# Patient Record
Sex: Male | Born: 1980 | Race: White | Hispanic: No | Marital: Married | Smoking: Never smoker
Health system: Southern US, Community
[De-identification: ages and names within clinical notes are randomized; demographics above are authoritative.]

## PROBLEM LIST (undated history)

## (undated) DIAGNOSIS — Z5189 Encounter for other specified aftercare: Secondary | ICD-10-CM

## (undated) DIAGNOSIS — J019 Acute sinusitis, unspecified: Secondary | ICD-10-CM

## (undated) DIAGNOSIS — Q2547 Right aortic arch: Secondary | ICD-10-CM

## (undated) DIAGNOSIS — D509 Iron deficiency anemia, unspecified: Principal | ICD-10-CM

## (undated) DIAGNOSIS — S83209A Unspecified tear of unspecified meniscus, current injury, unspecified knee, initial encounter: Secondary | ICD-10-CM

## (undated) DIAGNOSIS — L409 Psoriasis, unspecified: Secondary | ICD-10-CM

## (undated) DIAGNOSIS — M1990 Unspecified osteoarthritis, unspecified site: Secondary | ICD-10-CM

## (undated) DIAGNOSIS — L4050 Arthropathic psoriasis, unspecified: Secondary | ICD-10-CM

## (undated) HISTORY — DX: Unspecified osteoarthritis, unspecified site: M19.90

## (undated) HISTORY — DX: Iron deficiency anemia, unspecified: D50.9

## (undated) HISTORY — PX: INNER EAR SURGERY: SHX679

## (undated) HISTORY — PX: COLONOSCOPY WITH ESOPHAGOGASTRODUODENOSCOPY (EGD): SHX5779

## (undated) HISTORY — DX: Unspecified tear of unspecified meniscus, current injury, unspecified knee, initial encounter: S83.209A

## (undated) HISTORY — DX: Acute sinusitis, unspecified: J01.90

---
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
## 2009-02-16 ENCOUNTER — Ambulatory Visit (HOSPITAL_COMMUNITY): Admission: RE | Admit: 2009-02-16 | Discharge: 2009-02-16 | Payer: Self-pay | Admitting: Rheumatology

## 2009-02-16 ENCOUNTER — Encounter: Payer: Self-pay | Admitting: Cardiology

## 2009-02-16 ENCOUNTER — Ambulatory Visit: Payer: Self-pay | Admitting: Cardiology

## 2009-02-16 DIAGNOSIS — R930 Abnormal findings on diagnostic imaging of skull and head, not elsewhere classified: Secondary | ICD-10-CM | POA: Insufficient documentation

## 2009-02-18 DIAGNOSIS — I10 Essential (primary) hypertension: Secondary | ICD-10-CM | POA: Insufficient documentation

## 2009-02-20 ENCOUNTER — Encounter: Payer: Self-pay | Admitting: Cardiology

## 2009-02-20 ENCOUNTER — Ambulatory Visit: Payer: Self-pay

## 2013-06-09 ENCOUNTER — Other Ambulatory Visit (HOSPITAL_COMMUNITY): Payer: Self-pay | Admitting: *Deleted

## 2013-06-10 ENCOUNTER — Inpatient Hospital Stay (HOSPITAL_COMMUNITY): Admission: RE | Admit: 2013-06-10 | Payer: Self-pay | Source: Ambulatory Visit

## 2013-06-17 ENCOUNTER — Encounter (HOSPITAL_COMMUNITY)
Admission: RE | Admit: 2013-06-17 | Discharge: 2013-06-17 | Disposition: A | Discharge status: Home / Self Care | Payer: 59 | Source: Ambulatory Visit | Attending: Rheumatology | Admitting: Rheumatology

## 2013-06-17 DIAGNOSIS — Z538 Procedure and treatment not carried out for other reasons: Secondary | ICD-10-CM | POA: Insufficient documentation

## 2013-06-17 DIAGNOSIS — M069 Rheumatoid arthritis, unspecified: Secondary | ICD-10-CM | POA: Insufficient documentation

## 2013-06-17 MED ORDER — SODIUM CHLORIDE 0.9 % IV SOLN
INTRAVENOUS | Status: DC
  Administered 2013-06-17: 250 mL via INTRAVENOUS

## 2013-06-17 MED ORDER — SODIUM CHLORIDE 0.9 % IV SOLN
3.0000 mg/kg | INTRAVENOUS | Status: DC
  Administered 2013-06-17: 300 mg via INTRAVENOUS
  Filled 2013-06-17: qty 30

## 2013-07-01 ENCOUNTER — Encounter (HOSPITAL_COMMUNITY)
Admission: RE | Admit: 2013-07-01 | Discharge: 2013-07-01 | Disposition: A | Discharge status: Home / Self Care | Payer: 59 | Source: Ambulatory Visit | Attending: Rheumatology | Admitting: Rheumatology

## 2013-07-01 MED ORDER — SODIUM CHLORIDE 0.9 % IV SOLN
400.0000 mg | INTRAVENOUS | Status: DC
  Administered 2013-07-01: 400 mg via INTRAVENOUS
  Filled 2013-07-01: qty 40

## 2013-07-01 MED ORDER — SODIUM CHLORIDE 0.9 % IV SOLN
INTRAVENOUS | Status: DC
  Administered 2013-07-01: 250 mL via INTRAVENOUS

## 2013-07-01 MED ORDER — SODIUM CHLORIDE 0.9 % IV SOLN: 3.0000 mg/kg | INTRAVENOUS | Status: DC

## 2013-07-29 ENCOUNTER — Encounter (HOSPITAL_COMMUNITY): Payer: 59

## 2013-08-01 ENCOUNTER — Encounter (HOSPITAL_COMMUNITY)
Admission: RE | Admit: 2013-08-01 | Discharge: 2013-08-01 | Disposition: A | Discharge status: Home / Self Care | Payer: 59 | Source: Ambulatory Visit | Attending: Rheumatology | Admitting: Rheumatology

## 2013-08-01 DIAGNOSIS — Z538 Procedure and treatment not carried out for other reasons: Secondary | ICD-10-CM | POA: Insufficient documentation

## 2013-08-01 DIAGNOSIS — M069 Rheumatoid arthritis, unspecified: Secondary | ICD-10-CM | POA: Insufficient documentation

## 2013-08-01 MED ORDER — SODIUM CHLORIDE 0.9 % IV SOLN
400.0000 mg | INTRAVENOUS | Status: AC
  Administered 2013-08-01: 400 mg via INTRAVENOUS
  Filled 2013-08-01: qty 40

## 2013-08-01 MED ORDER — SODIUM CHLORIDE 0.9 % IV SOLN
INTRAVENOUS | Status: DC
  Administered 2013-08-01: 10:00:00 via INTRAVENOUS

## 2013-08-02 ENCOUNTER — Encounter (HOSPITAL_COMMUNITY): Payer: 59

## 2013-09-26 ENCOUNTER — Encounter (HOSPITAL_COMMUNITY): Payer: 59

## 2013-09-29 ENCOUNTER — Other Ambulatory Visit (HOSPITAL_COMMUNITY): Payer: Self-pay | Admitting: *Deleted

## 2013-09-30 ENCOUNTER — Inpatient Hospital Stay (HOSPITAL_COMMUNITY): Admission: RE | Admit: 2013-09-30 | Payer: 59 | Source: Ambulatory Visit

## 2013-11-01 ENCOUNTER — Ambulatory Visit (INDEPENDENT_AMBULATORY_CARE_PROVIDER_SITE_OTHER): Payer: 59 | Admitting: Nurse Practitioner

## 2013-11-01 ENCOUNTER — Encounter (INDEPENDENT_AMBULATORY_CARE_PROVIDER_SITE_OTHER): Payer: Self-pay

## 2013-11-01 VITALS — BP 145/93 | HR 93 | Temp 97.7°F | Wt 267.0 lb

## 2013-11-01 DIAGNOSIS — J069 Acute upper respiratory infection, unspecified: Secondary | ICD-10-CM

## 2013-11-01 DIAGNOSIS — A088 Other specified intestinal infections: Secondary | ICD-10-CM

## 2013-11-01 DIAGNOSIS — L4050 Arthropathic psoriasis, unspecified: Secondary | ICD-10-CM

## 2013-11-01 DIAGNOSIS — A084 Viral intestinal infection, unspecified: Secondary | ICD-10-CM

## 2013-11-01 MED ORDER — PROMETHAZINE HCL 12.5 MG PO TABS: 12.5000 mg | ORAL_TABLET | Freq: Three times a day (TID) | ORAL | Status: DC | PRN

## 2013-11-01 NOTE — Progress Notes (Signed)
   Subjective:    Patient ID: Joseph [name], male    DOB: 1981-10-09, 32 y.o.   MRN: [id number]  HPI  Patient in c/o nausea and vomiting last night with abdominal pain- that has resolved but woke up with headache this morning. * girlfriend said all of this really started when he did not go get IV remicade for psoriatic athritis.   Review of Systems  Constitutional: Positive for fever and chills.  HENT: Positive for congestion, rhinorrhea, sinus pressure and sore throat. Negative for trouble swallowing.   Respiratory: Positive for cough.   Gastrointestinal: Positive for nausea, vomiting and diarrhea.       Objective:   Physical Exam  Constitutional: [name] oriented to person, place, and time. He appears well-developed and well-nourished.  HENT:  Right Ear: Hearing, tympanic membrane, external ear and ear canal normal.  Left Ear: Hearing, tympanic membrane, external ear and ear canal normal.  Nose: Mucosal edema and rhinorrhea present. Right sinus exhibits no maxillary sinus tenderness and no frontal sinus tenderness. Left sinus exhibits no maxillary sinus tenderness and no frontal sinus tenderness.  Mouth/Throat: Uvula is midline, oropharynx is clear and moist and mucous membranes are normal.  Cardiovascular: Normal rate, regular rhythm and normal heart sounds.   Pulmonary/Chest: Effort normal and breath sounds normal.  Abdominal: Soft. Bowel sounds are normal.  Neurological: [name] alert and oriented to person, place, and time.  Skin: Skin is warm.  Psychiatric: He has a normal mood and affect. His behavior is normal. Judgment and thought content normal.    BP 145/93  Pulse 93  Temp(Src) 97.7 F (36.5 C) (Oral)  Wt 267 lb (121.11 kg)       Assessment & Plan:   1. Viral gastroenteritis   2. Viral URI   3. Psoriatic arthritis   4. Medication withdrawal    Meds ordered this encounter  Medications  . promethazine (PHENERGAN) 12.5 MG tablet    Sig: Take 1 tablet (12.5 mg  total) by mouth every 8 (eight) hours as needed for nausea or vomiting.    Dispense:  20 tablet    Refill:  0    Order Specific Question:  Supervising Provider    Answer:  Ernestina Penna [[id number]]   First 24 Hours-Clear liquids  popsicles  Jello  gatorade  Sprite Second 24 hours-Add Full liquids ( Liquids you cant see through) Third 24 hours- Bland diet ( foods that are baked or broiled)  *avoiding fried foods and highly spiced foods* During these 3 days  Avoid milk, cheese, ice cream or any other dairy products  Avoid caffeine- REMEMBER Mt. Dew and Mello Yellow contain lots of caffeine You should eat and drink in  Frequent small volumes If no improvement in symptoms or worsen in 2-3 days should RETRUN TO OFFICE or go to ER!    Need to make appointment for remicade infusion  Mary-Margaret Daphine Deutscher, FNP

## 2013-11-01 NOTE — Patient Instructions (Signed)

## 2013-11-27 ENCOUNTER — Emergency Department (HOSPITAL_COMMUNITY): Payer: 59

## 2013-11-27 ENCOUNTER — Encounter (HOSPITAL_COMMUNITY): Payer: Self-pay | Admitting: Emergency Medicine

## 2013-11-27 ENCOUNTER — Emergency Department (HOSPITAL_COMMUNITY)
Admission: EM | Admit: 2013-11-27 | Discharge: 2013-11-27 | Disposition: A | Discharge status: Home / Self Care | Payer: 59 | Attending: Emergency Medicine | Admitting: Emergency Medicine

## 2013-11-27 DIAGNOSIS — L03119 Cellulitis of unspecified part of limb: Principal | ICD-10-CM

## 2013-11-27 DIAGNOSIS — L0390 Cellulitis, unspecified: Secondary | ICD-10-CM

## 2013-11-27 DIAGNOSIS — Z8719 Personal history of other diseases of the digestive system: Secondary | ICD-10-CM | POA: Insufficient documentation

## 2013-11-27 DIAGNOSIS — L02519 Cutaneous abscess of unspecified hand: Secondary | ICD-10-CM | POA: Insufficient documentation

## 2013-11-27 DIAGNOSIS — Z8739 Personal history of other diseases of the musculoskeletal system and connective tissue: Secondary | ICD-10-CM | POA: Insufficient documentation

## 2013-11-27 DIAGNOSIS — R112 Nausea with vomiting, unspecified: Secondary | ICD-10-CM | POA: Insufficient documentation

## 2013-11-27 DIAGNOSIS — Z862 Personal history of diseases of the blood and blood-forming organs and certain disorders involving the immune mechanism: Secondary | ICD-10-CM | POA: Insufficient documentation

## 2013-11-27 HISTORY — DX: Encounter for other specified aftercare: Z51.89

## 2013-11-27 LAB — CBC WITH DIFFERENTIAL/PLATELET
BASOS ABS: 0.1 10*3/uL (ref 0.0–0.1)
Basophils Relative: 1 % (ref 0–1)
Eosinophils Absolute: 0 10*3/uL (ref 0.0–0.7)
Eosinophils Relative: 0 % (ref 0–5)
HCT: 38 % — ABNORMAL LOW (ref 39.0–52.0)
Hemoglobin: 11.6 g/dL — ABNORMAL LOW (ref 13.0–17.0)
LYMPHS ABS: 1.1 10*3/uL (ref 0.7–4.0)
LYMPHS PCT: 15 % (ref 12–46)
MCH: 22.8 pg — ABNORMAL LOW (ref 26.0–34.0)
MCHC: 30.5 g/dL (ref 30.0–36.0)
MCV: 74.7 fL — AB (ref 78.0–100.0)
MONOS PCT: 23 % — AB (ref 3–12)
Monocytes Absolute: 1.7 10*3/uL — ABNORMAL HIGH (ref 0.1–1.0)
NEUTROS PCT: 61 % (ref 43–77)
Neutro Abs: 4.7 10*3/uL (ref 1.7–7.7)
PLATELETS: 164 10*3/uL (ref 150–400)
RBC: 5.09 MIL/uL (ref 4.22–5.81)
RDW: 24.1 % — AB (ref 11.5–15.5)
WBC: 7.6 10*3/uL (ref 4.0–10.5)

## 2013-11-27 LAB — COMPREHENSIVE METABOLIC PANEL
ALBUMIN: 3.5 g/dL (ref 3.5–5.2)
ALT: 7 U/L (ref 0–53)
AST: 15 U/L (ref 0–37)
Alkaline Phosphatase: 76 U/L (ref 39–117)
BILIRUBIN TOTAL: 0.8 mg/dL (ref 0.3–1.2)
BUN: 7 mg/dL (ref 6–23)
CHLORIDE: 96 meq/L (ref 96–112)
CO2: 22 mEq/L (ref 19–32)
CREATININE: 1.05 mg/dL (ref 0.50–1.35)
Calcium: 8.6 mg/dL (ref 8.4–10.5)
GFR calc Af Amer: >90 mL/min (ref >90)
GFR calc non Af Amer: >90 mL/min (ref >90)
Glucose, Bld: 102 mg/dL — ABNORMAL HIGH (ref 70–99)
Potassium: 3.4 mEq/L — ABNORMAL LOW (ref 3.7–5.3)
Sodium: 137 mEq/L (ref 137–147)
Total Protein: 7.5 g/dL (ref 6.0–8.3)

## 2013-11-27 LAB — OCCULT BLOOD, POC DEVICE: FECAL OCCULT BLD: NEGATIVE

## 2013-11-27 LAB — TYPE AND SCREEN
ABO/RH(D): A POS
ANTIBODY SCREEN: NEGATIVE

## 2013-11-27 LAB — ABO/RH: ABO/RH(D): A POS

## 2013-11-27 MED ORDER — CLINDAMYCIN HCL 150 MG PO CAPS: 450.0000 mg | ORAL_CAPSULE | Freq: Three times a day (TID) | ORAL | Status: DC

## 2013-11-27 MED ORDER — ONDANSETRON 4 MG PO TBDP: 4.0000 mg | ORAL_TABLET | Freq: Three times a day (TID) | ORAL | Status: DC | PRN

## 2013-11-27 MED ORDER — CLINDAMYCIN PHOSPHATE 900 MG/50ML IV SOLN
900.0000 mg | Freq: Once | INTRAVENOUS | Status: AC
  Administered 2013-11-27: 900 mg via INTRAVENOUS
  Filled 2013-11-27: qty 50

## 2013-11-27 NOTE — ED Provider Notes (Signed)
CSN: [id number]     Arrival date & time 11/27/13  1640 History   First MD Initiated Contact with Patient 11/27/13 1755     Chief Complaint  Patient presents with  . Nausea  . Emesis   (Consider location/radiation/quality/duration/timing/severity/associated sxs/prior Treatment) Patient is a 33 y.o. male presenting with general illness. The history is provided by the patient.  Illness Severity:  Moderate Onset quality:  Gradual Duration:  2 days Timing:  Constant Progression:  Worsening Chronicity:  New Associated symptoms: fever, nausea and vomiting   Associated symptoms: no abdominal pain, no chest pain, no congestion, no cough, no diarrhea, no headaches, no myalgias, no rash, no rhinorrhea and no shortness of breath     Past Medical History  Diagnosis Date  . Arthritis   . Blood transfusion without reported diagnosis     At [facility]    Past Surgical History  Procedure Laterality Date  . Hernia repair     Family History  Problem Relation Age of Onset  . Pulmonary fibrosis Mother    History  Substance Use Topics  . Smoking status: Never Smoker   . Smokeless tobacco: Not on file  . Alcohol Use: No    Review of Systems  Constitutional: Positive for fever. Negative for chills.  HENT: Negative for congestion, facial swelling and rhinorrhea.   Eyes: Negative for discharge and visual disturbance.  Respiratory: Negative for cough and shortness of breath.   Cardiovascular: Negative for chest pain and palpitations.  Gastrointestinal: Positive for nausea and vomiting. Negative for abdominal pain and diarrhea.  Musculoskeletal: Negative for arthralgias and myalgias.  Skin: Negative for color change and rash.  Neurological: Negative for tremors, syncope and headaches.  Psychiatric/Behavioral: Negative for confusion and dysphoric mood.    Allergies  Sulfonamide derivatives  Home Medications   Current Outpatient Rx  Name  Route  Sig  Dispense  Refill  . promethazine  (PHENERGAN) 12.5 MG tablet   Oral   Take 1 tablet (12.5 mg total) by mouth every 8 (eight) hours as needed for nausea or vomiting.   20 tablet   0    BP 129/87  Pulse 111  Temp(Src) 98.9 F (37.2 C) (Oral)  Resp 18  SpO2 95% Physical Exam  Constitutional: [name] oriented to person, place, and time. He appears well-developed and well-nourished.  HENT:  Head: Normocephalic and atraumatic.  Eyes: EOM are normal. Pupils are equal, round, and reactive to light.  Neck: Normal range of motion. Neck supple. No JVD present.  Cardiovascular: Normal rate and regular rhythm.  Exam reveals no gallop and no friction rub.   No murmur heard. Pulmonary/Chest: No respiratory distress. He has no wheezes.  Abdominal: He exhibits no distension. There is no rebound and no guarding.  Musculoskeletal: Normal range of motion.  Neurological: [name] alert and oriented to person, place, and time.  Skin: No rash noted. No pallor.     Psychiatric: He has a normal mood and affect. His behavior is normal.    ED Course  Procedures (including critical care time) Labs Review Labs Reviewed  CBC WITH DIFFERENTIAL - Abnormal; Notable for the following:    Hemoglobin 11.6 (*)    HCT 38.0 (*)    MCV 74.7 (*)    MCH 22.8 (*)    RDW 24.1 (*)    All other components within normal limits  COMPREHENSIVE METABOLIC PANEL  TYPE AND SCREEN   Imaging Review No results found.  EKG Interpretation   None  MDM  No diagnosis found. Patient is a 33 y.o. male who presents with fever, vomiting.  This started yesterday.  Patient recently admitted to [facility] for anemia, thought to be secondary to lower GI bleed.  Negative CT scan, negative endoscopy.  Patient received 6 units of blood to correct hbg of 5.5, patient responded appropriately and is 11 here.  Family concerned because these were the symptoms he had prior to finding out he was anemic.  Denies abdominal pain, no bright red blood in vomit or stool, no dark  and tarry stools.    Right wrist erythematous and edematous.  Full ROM right wrist with minimal tenderness, doubt septic joint, pain at location at prior iv placement, suspect cellulitis, will obtain blood cultures, treat with clinda, xray to rule out foreign body.   No noted foreign body on xray, patient well appearing, tolerated PO in the ED.  Will discharge home.    I have discussed the diagnosis/risks/treatment options with the patient and family and believe the pt to be eligible for discharge home to follow-up with PCP. We also discussed returning to the ED immediately if new or worsening sx occur. We discussed the sx which are most concerning (e.g., sudden onset abdominal pain, worsening wrist pain) that necessitate immediate return. Any new prescriptions provided to the patient are listed below.  Discharge Medication List as of 11/27/2013  9:57 PM    START taking these medications   Details  clindamycin (CLEOCIN) 150 MG capsule Take 3 capsules (450 mg total) by mouth 3 (three) times daily., Starting 11/27/2013, Until Discontinued, Print            Melene Planan Shamiah Kahler, MD 11/28/13 [phone]

## 2013-11-27 NOTE — Discharge Instructions (Signed)
Cellulitis Cellulitis is an infection of the skin and the tissue beneath it. The infected area is usually red and tender. Cellulitis occurs most often in the arms and lower legs.  CAUSES  Cellulitis is caused by bacteria that enter the skin through cracks or cuts in the skin. The most common types of bacteria that cause cellulitis are Staphylococcus and Streptococcus. SYMPTOMS   Redness and warmth.  Swelling.  Tenderness or pain.  Fever. DIAGNOSIS  Your caregiver can usually determine what is wrong based on a physical exam. Blood tests may also be done. TREATMENT  Treatment usually involves taking an antibiotic medicine. HOME CARE INSTRUCTIONS   Take your antibiotics as directed. Finish them even if you start to feel better.  Keep the infected arm or leg elevated to reduce swelling.  Apply a warm cloth to the affected area up to 4 times per day to relieve pain.  Only take over-the-counter or prescription medicines for pain, discomfort, or fever as directed by your caregiver.  Keep all follow-up appointments as directed by your caregiver. SEEK MEDICAL CARE IF:   You notice red streaks coming from the infected area.  Your red area gets larger or turns dark in color.  Your bone or joint underneath the infected area becomes painful after the skin has healed.  Your infection returns in the same area or another area.  You notice a swollen bump in the infected area.  You develop new symptoms. SEEK IMMEDIATE MEDICAL CARE IF:   You have a fever.  You feel very sleepy.  You develop vomiting or diarrhea.  You have a general ill feeling (malaise) with muscle aches and pains. MAKE SURE YOU:   Understand these instructions.  Will watch your condition.  Will get help right away if you are not doing well or get worse. Document Released: 08/06/2005 Document Revised: 04/27/2012 Document Reviewed: 01/12/2012 Exit[employer] Patient Information 2014 [employer].  

## 2013-11-27 NOTE — ED Notes (Signed)
Pt given ginger ale.

## 2013-11-27 NOTE — ED Notes (Signed)
[facility]. Reports that his Hemoglobin was 5 and he got 6 units of blood. Reports that he was sent home Friday and has been having fever, nausea and vomiting since then. Pt right wrist is swollen where IV site was from hospital admission.

## 2013-11-28 NOTE — ED Provider Notes (Signed)
I saw and evaluated the patient, reviewed the resident's note and I agree with the findings and plan. If applicable, I agree with the resident's interpretation of the EKG.  If applicable, I was present for critical portions of any procedures performed.  R hand/wrist erythema and swelling at site of previous IV insertion. +2 radial pulse, FROM wrist, hand fingers. No evidence of septic joint. [facility] records reviewed.  Admitted with severe anemia and EGD that showed erosions. Hemoglobin appropriate today. Abdomen soft and nontender.  BP 133/86  Pulse 87  Temp(Src) 99.4 F (37.4 C) (Oral)  Resp 14  SpO2 97%   Glynn OctaveStephen Teneshia Hedeen, MD 11/28/13 1016

## 2013-12-04 LAB — CULTURE, BLOOD (ROUTINE X 2)
CULTURE: NO GROWTH
Culture: NO GROWTH

## 2014-03-02 ENCOUNTER — Other Ambulatory Visit (HOSPITAL_COMMUNITY): Payer: Self-pay | Admitting: *Deleted

## 2014-03-03 ENCOUNTER — Encounter (HOSPITAL_COMMUNITY)
Admission: RE | Admit: 2014-03-03 | Discharge: 2014-03-03 | Disposition: A | Discharge status: Home / Self Care | Payer: 59 | Source: Ambulatory Visit | Attending: Rheumatology | Admitting: Rheumatology

## 2014-03-03 DIAGNOSIS — L4050 Arthropathic psoriasis, unspecified: Secondary | ICD-10-CM | POA: Diagnosis not present

## 2014-03-03 MED ORDER — SODIUM CHLORIDE 0.9 % IV SOLN
INTRAVENOUS | Status: DC
  Administered 2014-03-03: 09:00:00 via INTRAVENOUS

## 2014-03-03 MED ORDER — SODIUM CHLORIDE 0.9 % IV SOLN
3.0000 mg/kg | Freq: Once | INTRAVENOUS | Status: AC
  Administered 2014-03-03: 400 mg via INTRAVENOUS
  Filled 2014-03-03: qty 40

## 2014-04-28 ENCOUNTER — Encounter (HOSPITAL_COMMUNITY)
Admission: RE | Admit: 2014-04-28 | Discharge: 2014-04-28 | Disposition: A | Discharge status: Home / Self Care | Payer: 59 | Source: Ambulatory Visit | Attending: Rheumatology | Admitting: Rheumatology

## 2014-04-28 DIAGNOSIS — L4050 Arthropathic psoriasis, unspecified: Secondary | ICD-10-CM | POA: Insufficient documentation

## 2014-04-28 MED ORDER — SODIUM CHLORIDE 0.9 % IV SOLN
Freq: Once | INTRAVENOUS | Status: AC
  Administered 2014-04-28: 250 mL via INTRAVENOUS

## 2014-04-28 MED ORDER — SODIUM CHLORIDE 0.9 % IV SOLN
400.0000 mg | Freq: Once | INTRAVENOUS | Status: AC
  Administered 2014-04-28: 400 mg via INTRAVENOUS
  Filled 2014-04-28: qty 40

## 2014-06-23 ENCOUNTER — Encounter (HOSPITAL_COMMUNITY)
Admission: RE | Admit: 2014-06-23 | Discharge: 2014-06-23 | Disposition: A | Discharge status: Home / Self Care | Payer: 59 | Source: Ambulatory Visit | Attending: Rheumatology | Admitting: Rheumatology

## 2014-06-23 DIAGNOSIS — L4050 Arthropathic psoriasis, unspecified: Secondary | ICD-10-CM | POA: Insufficient documentation

## 2014-06-23 MED ORDER — SODIUM CHLORIDE 0.9 % IV SOLN
3.0000 mg/kg | INTRAVENOUS | Status: DC
  Administered 2014-06-23: 400 mg via INTRAVENOUS
  Filled 2014-06-23: qty 40

## 2014-06-23 MED ORDER — SODIUM CHLORIDE 0.9 % IV SOLN
INTRAVENOUS | Status: DC
  Administered 2014-06-23: 09:00:00 via INTRAVENOUS

## 2014-08-17 ENCOUNTER — Other Ambulatory Visit (HOSPITAL_COMMUNITY): Payer: Self-pay | Admitting: *Deleted

## 2014-08-18 ENCOUNTER — Encounter (HOSPITAL_COMMUNITY): Payer: 59

## 2014-08-23 ENCOUNTER — Encounter (HOSPITAL_COMMUNITY)
Admission: RE | Admit: 2014-08-23 | Discharge: 2014-08-23 | Disposition: A | Discharge status: Home / Self Care | Payer: 59 | Source: Ambulatory Visit | Attending: Rheumatology | Admitting: Rheumatology

## 2014-08-23 DIAGNOSIS — L4050 Arthropathic psoriasis, unspecified: Secondary | ICD-10-CM | POA: Insufficient documentation

## 2014-08-23 MED ORDER — SODIUM CHLORIDE 0.9 % IV SOLN
3.0000 mg/kg | INTRAVENOUS | Status: DC
  Administered 2014-08-23: 400 mg via INTRAVENOUS
  Filled 2014-08-23: qty 40

## 2014-08-23 MED ORDER — SODIUM CHLORIDE 0.9 % IV SOLN: INTRAVENOUS | Status: DC

## 2014-09-13 ENCOUNTER — Telehealth: Payer: Self-pay | Admitting: Hematology and Oncology

## 2014-09-13 NOTE — Telephone Encounter (Signed)
S/W PATIENT AND GAVE NP APPT FOR 11/12 @ 1:15 W/GORSUCH.

## 2014-09-21 ENCOUNTER — Ambulatory Visit (HOSPITAL_BASED_OUTPATIENT_CLINIC_OR_DEPARTMENT_OTHER): Payer: 59 | Admitting: Hematology and Oncology

## 2014-09-21 ENCOUNTER — Encounter: Payer: Self-pay | Admitting: Hematology and Oncology

## 2014-09-21 ENCOUNTER — Telehealth: Payer: Self-pay | Admitting: Hematology and Oncology

## 2014-09-21 ENCOUNTER — Ambulatory Visit (HOSPITAL_BASED_OUTPATIENT_CLINIC_OR_DEPARTMENT_OTHER): Payer: 59

## 2014-09-21 VITALS — BP 132/71 | HR 98 | Temp 98.9°F | Resp 18 | Ht 71.0 in | Wt 278.8 lb

## 2014-09-21 DIAGNOSIS — L4050 Arthropathic psoriasis, unspecified: Secondary | ICD-10-CM | POA: Insufficient documentation

## 2014-09-21 DIAGNOSIS — D509 Iron deficiency anemia, unspecified: Secondary | ICD-10-CM

## 2014-09-21 HISTORY — DX: Iron deficiency anemia, unspecified: D50.9

## 2014-09-21 NOTE — Telephone Encounter (Signed)
gv adn printed appt sched and avs for pt for NOV adn DEC...sed added tx.... °

## 2014-09-21 NOTE — Assessment & Plan Note (Signed)
He was treated with steroids, methotrexate and most recently was placed on Remicade All these agents can cause profound anemia with bone marrow suppressive effects. I will defer to his rheumatologist for treatment. In the mean time, I will attempt to treat his anemia. [name] recommended to take folic acid supplement

## 2014-09-21 NOTE — Progress Notes (Signed)
[facility] CONSULT NOTE  Patient Care Team: Joette CatchingLeonard Nyland, MD as PCP - General (Family Medicine)  CHIEF COMPLAINTS/PURPOSE OF CONSULTATION:  Severe anemia  HISTORY OF PRESENTING ILLNESS:  Joseph [name] 33 y.o. male is here because of anemia.  He was found to have abnormal CBC from routine blood work monitoring. His most recent blood work ranged from 7.8 to 8.5. His labs from January 2015 was 11.6 He denies recent chest pain on exertion, pre-syncopal episodes, or palpitations. He complained of fatigue, dizzy and had leg cramps He had not noticed any recent bleeding such as epistaxis, hematuria or hematochezia The patient denies over the counter NSAID ingestion recently. [name] not on antiplatelets agents. His last colonoscopy was in January 2015.  He had no prior history or diagnosis of cancer. His age appropriate screening programs are up-to-date. He has excessive pica with chewing on ice. He eats a variety of diet. He never donated blood but had received blood transfusion The patient was not prescribed oral iron supplements yet. He has psoriatic arthritis and had been on various treatments including steroids, remicade and methotrexate.  MEDICAL HISTORY:  Past Medical History  Diagnosis Date  . Arthritis   . Blood transfusion without reported diagnosis     [facility]   . Iron deficiency anemia 09/21/2014    SURGICAL HISTORY: Past Surgical History  Procedure Laterality Date  . Hernia repair    . Colonoscopy with esophagogastroduodenoscopy (egd)      SOCIAL HISTORY: History   Social History  . Marital Status: Single    Spouse Name: N/A    Number of Children: N/A  . Years of Education: N/A   Occupational History  . Not on file.   Social History Main Topics  . Smoking status: Never Smoker   . Smokeless tobacco: Never Used  . Alcohol Use: No  . Drug Use: No  . Sexual Activity: Not on file   Other Topics Concern  . Not on file   Social History  Narrative    FAMILY HISTORY: Family History  Problem Relation Age of Onset  . Pulmonary fibrosis Mother     ALLERGIES:  is allergic to sulfonamide derivatives.  MEDICATIONS:  Current Outpatient Prescriptions  Medication Sig Dispense Refill  . clobetasol cream (TEMOVATE) 0.05 % Apply topically 2 (two) times daily as needed.    . folic acid (FOLVITE) 1 MG tablet Take 2 mg by mouth daily.    . InFLIXimab (REMICADE IV) Inject into the vein. Every 6 weeks per Dr. Corliss Skainseveshwar    . OTREXUP 10 MG/0.4ML SOAJ Inject 10 mg into the skin once a week.     No current facility-administered medications for this visit.    REVIEW OF SYSTEMS:   Constitutional: Denies fevers, chills or abnormal night sweats Eyes: Denies blurriness of vision, double vision or watery eyes Ears, nose, mouth, throat, and face: Denies mucositis or sore throat Respiratory: Denies cough, dyspnea or wheezes Cardiovascular: Denies palpitation, chest discomfort or lower extremity swelling Gastrointestinal:  Denies nausea, heartburn or change in bowel habits Skin: Denies abnormal skin rashes Lymphatics: Denies new lymphadenopathy or easy bruising Neurological:Denies numbness, tingling or new weaknesses Behavioral/Psych: Mood is stable, no new changes  All other systems were reviewed with the patient and are negative.  PHYSICAL EXAMINATION: ECOG PERFORMANCE STATUS: 1 - Symptomatic but completely ambulatory  Filed Vitals:   09/21/14 1319  BP: 132/71  Pulse: 98  Temp: 98.9 F (37.2 C)  Resp: 18   Filed  Weights   09/21/14 1319  Weight: 278 lb 12.8 oz (126.463 kg)    GENERAL:alert, no distress and comfortable. [name] morbidly obese. SKIN: skin color is pale, texture, turgor are normal, no rashes or significant lesions EYES: normal, conjunctiva are pale and non-injected, sclera clear OROPHARYNX:no exudate, no erythema and lips, buccal mucosa, and tongue normal  NECK: supple, thyroid normal size, non-tender, without  nodularity LYMPH:  no palpable lymphadenopathy in the cervical, axillary or inguinal LUNGS: clear to auscultation and percussion with normal breathing effort HEART: regular rate & rhythm and no murmurs and no lower extremity edema ABDOMEN:abdomen soft, non-tender and normal bowel sounds Musculoskeletal:no cyanosis of digits and no clubbing  PSYCH: alert & oriented x 3 with fluent speech NEURO: no focal motor/sensory deficits  LABORATORY DATA:  I have reviewed the data as listed  ASSESSMENT & PLAN:  Iron deficiency anemia The most likely cause of his anemia is due to chronic blood loss/malabsorption syndrome all bone marrow suppression from chronic immunosuppressive therapy. We discussed some of the risks, benefits, and alternatives of intravenous iron infusions. The patient is symptomatic from anemia and the iron level is critically low. He tolerated oral iron supplement poorly and desires to achieved higher levels of iron faster for adequate hematopoesis. Some of the side-effects to be expected including risks of infusion reactions, phlebitis, headaches, nausea and fatigue.  The patient is willing to proceed. Patient education material was dispensed.  Goal is to keep ferritin level greater than 50. I plan to give him 1 dose of IV iron next week and recommend he continues taking folic acid. I plan to check additional labs for other cause of anemia and celiac disease panel. I have requested consent to obtain information from his GI doctor who had performed EGD and colonoscopy recently for my review. I reminded him to avoid taking NSAIDS for joint pain which can increase his risk for GI bleed.  Psoriatic arthritis He was treated with steroids, methotrexate and most recently was placed on Remicade All these agents can cause profound anemia with bone marrow suppressive effects. I will defer to his rheumatologist for treatment. In the mean time, I will attempt to treat his anemia. [name]  recommended to take folic acid supplement    All questions were answered. The patient knows to call the clinic with any problems, questions or concerns. I spent 40 minutes counseling the patient face to face. The total time spent in the appointment was 60 minutes and more than 50% was on counseling.     [facility] Ctr[facility]ORSUCH, Dominico Rod, MD 09/21/2014 8:48 PM

## 2014-09-21 NOTE — Progress Notes (Signed)
Checked in new patient with no financial issues prior to seeing the dr. He has not traveled and has appt card.

## 2014-09-21 NOTE — Patient Instructions (Signed)

## 2014-09-21 NOTE — Assessment & Plan Note (Addendum)
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 We discussed some of the risks, benefits, and alternatives of intravenous iron infusions. The patient is symptomatic from anemia and the iron level is critically low. He tolerated oral iron supplement poorly and desires to achieved higher levels of iron faster for adequate hematopoesis. Some of the side-effects to be expected including risks of infusion reactions, phlebitis, headaches, nausea and fatigue.  The patient is willing to proceed. Patient education material was dispensed.  Goal is to keep ferritin level greater than 50. I plan to give him 1 dose of IV iron next week and recommend he continues taking folic acid. I plan to check additional labs for other cause of anemia and celiac disease panel. I have requested consent to obtain information from his GI doctor who had performed EGD and colonoscopy recently for my review. I reminded him to avoid taking NSAIDS for joint pain which can increase his risk for GI bleed.

## 2014-09-22 ENCOUNTER — Telehealth: Payer: Self-pay | Admitting: *Deleted

## 2014-09-22 NOTE — Telephone Encounter (Signed)
I have adjsuted 11/18 appt

## 2014-09-22 NOTE — Telephone Encounter (Signed)
Faxed signed release of medical information to Dr. Teena DunkBenson, GI, in [place] to fax [phone].  Dr. Bertis RuddyGorsuch requests EGD/Colonoscopy report.

## 2014-09-22 NOTE — Telephone Encounter (Signed)
Attempting to reach pt to notify of appt time change on Wed from afternoon to morning.  No voice mail available on his cell phone and his girlfriend's cell phone.  Called home number and it was his father who instructed to call pt's cell phone.  Will try again next week.

## 2014-09-27 ENCOUNTER — Ambulatory Visit (HOSPITAL_BASED_OUTPATIENT_CLINIC_OR_DEPARTMENT_OTHER): Payer: 59

## 2014-09-27 DIAGNOSIS — D509 Iron deficiency anemia, unspecified: Secondary | ICD-10-CM

## 2014-09-27 MED ORDER — SODIUM CHLORIDE 0.9 % IV SOLN
1020.0000 mg | Freq: Once | INTRAVENOUS | Status: AC
  Administered 2014-09-27: 1020 mg via INTRAVENOUS
  Filled 2014-09-27: qty 34

## 2014-09-27 MED ORDER — SODIUM CHLORIDE 0.9 % IV SOLN
Freq: Once | INTRAVENOUS | Status: AC
  Administered 2014-09-27: 15:00:00 via INTRAVENOUS

## 2014-09-27 NOTE — Progress Notes (Signed)
Patient received Feraheme for the 1st time today; tolerated well with no complaints; observation for 30 mins post infusion and no signs or symptoms of distress noted; post vital signs stable; patient discharge home with girlfriend and no complaints.

## 2014-09-27 NOTE — Patient Instructions (Signed)

## 2014-10-18 ENCOUNTER — Encounter (HOSPITAL_COMMUNITY): Payer: 59

## 2014-10-19 ENCOUNTER — Encounter: Payer: Self-pay | Admitting: Hematology and Oncology

## 2014-10-19 ENCOUNTER — Other Ambulatory Visit (HOSPITAL_COMMUNITY): Payer: Self-pay | Admitting: *Deleted

## 2014-10-20 ENCOUNTER — Encounter (HOSPITAL_COMMUNITY)
Admission: RE | Admit: 2014-10-20 | Discharge: 2014-10-20 | Disposition: A | Discharge status: Home / Self Care | Payer: 59 | Source: Ambulatory Visit | Attending: Rheumatology | Admitting: Rheumatology

## 2014-10-20 DIAGNOSIS — L4050 Arthropathic psoriasis, unspecified: Secondary | ICD-10-CM | POA: Diagnosis not present

## 2014-10-20 MED ORDER — SODIUM CHLORIDE 0.9 % IV SOLN
INTRAVENOUS | Status: AC
  Administered 2014-10-20: 09:00:00 via INTRAVENOUS

## 2014-10-20 MED ORDER — INFLIXIMAB 100 MG IV SOLR
3.0000 mg/kg | INTRAVENOUS | Status: AC
  Administered 2014-10-20: 400 mg via INTRAVENOUS
  Filled 2014-10-20: qty 40

## 2014-10-24 ENCOUNTER — Telehealth: Payer: Self-pay | Admitting: Hematology and Oncology

## 2014-10-24 NOTE — Telephone Encounter (Signed)
returned pt call and lvm for pt to call back to rs °

## 2014-10-25 ENCOUNTER — Other Ambulatory Visit (HOSPITAL_BASED_OUTPATIENT_CLINIC_OR_DEPARTMENT_OTHER): Payer: 59

## 2014-10-25 DIAGNOSIS — D509 Iron deficiency anemia, unspecified: Secondary | ICD-10-CM

## 2014-10-25 LAB — CBC & DIFF AND RETIC
BASO%: 0.8 % (ref 0.0–2.0)
BASOS ABS: 0.1 10*3/uL (ref 0.0–0.1)
EOS ABS: 0.2 10*3/uL (ref 0.0–0.5)
EOS%: 2.3 % (ref 0.0–7.0)
HEMATOCRIT: 32.8 % — AB (ref 38.4–49.9)
HGB: 9.6 g/dL — ABNORMAL LOW (ref 13.0–17.1)
IMMATURE RETIC FRACT: 16.1 % — AB (ref 3.00–10.60)
LYMPH#: 3.1 10*3/uL (ref 0.9–3.3)
LYMPH%: 34.5 % (ref 14.0–49.0)
MCH: 21.6 pg — AB (ref 27.2–33.4)
MCHC: 29.3 g/dL — ABNORMAL LOW (ref 32.0–36.0)
MCV: 73.8 fL — ABNORMAL LOW (ref 79.3–98.0)
MONO#: 0.7 10*3/uL (ref 0.1–0.9)
MONO%: 7.8 % (ref 0.0–14.0)
NEUT#: 4.9 10*3/uL (ref 1.5–6.5)
NEUT%: 54.6 % (ref 39.0–75.0)
Platelets: 210 10*3/uL (ref 140–400)
RBC: 4.45 10*6/uL (ref 4.20–5.82)
RDW: 28.2 % — AB (ref 11.0–14.6)
RETIC CT ABS: 68.09 10*3/uL (ref 34.80–93.90)
Retic %: 1.53 % (ref 0.80–1.80)
WBC: 9 10*3/uL (ref 4.0–10.3)

## 2014-10-25 LAB — IRON AND TIBC CHCC
%SAT: 7 % — AB (ref 20–55)
Iron: 22 ug/dL — ABNORMAL LOW (ref 42–163)
TIBC: 341 ug/dL (ref 202–409)
UIBC: 319 ug/dL (ref 117–376)

## 2014-10-25 LAB — COMPREHENSIVE METABOLIC PANEL (CC13)
ALBUMIN: 3.7 g/dL (ref 3.5–5.0)
ALT: 16 U/L (ref 0–55)
ANION GAP: 10 meq/L (ref 3–11)
AST: 18 U/L (ref 5–34)
Alkaline Phosphatase: 83 U/L (ref 40–150)
BUN: 13.4 mg/dL (ref 7.0–26.0)
CHLORIDE: 104 meq/L (ref 98–109)
CO2: 24 mEq/L (ref 22–29)
Calcium: 8.5 mg/dL (ref 8.4–10.4)
Creatinine: 1 mg/dL (ref 0.7–1.3)
EGFR: >90 mL/min/{1.73_m2} (ref >90)
Glucose: 103 mg/dl (ref 70–140)
POTASSIUM: 3.2 meq/L — AB (ref 3.5–5.1)
Sodium: 138 mEq/L (ref 136–145)
Total Bilirubin: 0.43 mg/dL (ref 0.20–1.20)
Total Protein: 7 g/dL (ref 6.4–8.3)

## 2014-10-25 LAB — CHCC SMEAR

## 2014-10-25 LAB — FERRITIN CHCC: Ferritin: 25 ng/ml (ref 22–316)

## 2014-10-26 LAB — SEDIMENTATION RATE: Sed Rate: 5 mm/hr (ref 0–16)

## 2014-10-26 LAB — VITAMIN B12: Vitamin B-12: 354 pg/mL (ref 211–911)

## 2014-10-26 LAB — DIRECT ANTIGLOBULIN TEST (NOT AT ARMC)
DAT (COMPLEMENT): NEGATIVE
DAT IGG: NEGATIVE

## 2014-10-30 ENCOUNTER — Telehealth: Payer: Self-pay | Admitting: Hematology and Oncology

## 2014-10-30 NOTE — Telephone Encounter (Signed)
returned pt call and lvm to him to call back to r/s

## 2014-10-31 ENCOUNTER — Ambulatory Visit: Payer: 59 | Admitting: Hematology and Oncology

## 2014-10-31 ENCOUNTER — Telehealth: Payer: Self-pay | Admitting: Hematology and Oncology

## 2014-10-31 NOTE — Telephone Encounter (Signed)
9pt called to r/s appt...done...pt ok and aware

## 2014-11-07 ENCOUNTER — Encounter: Payer: Self-pay | Admitting: Hematology and Oncology

## 2014-11-07 ENCOUNTER — Ambulatory Visit (HOSPITAL_BASED_OUTPATIENT_CLINIC_OR_DEPARTMENT_OTHER): Payer: 59 | Admitting: Hematology and Oncology

## 2014-11-07 ENCOUNTER — Telehealth: Payer: Self-pay | Admitting: Hematology and Oncology

## 2014-11-07 ENCOUNTER — Telehealth: Payer: Self-pay | Admitting: *Deleted

## 2014-11-07 VITALS — BP 128/86 | HR 74 | Temp 98.2°F | Wt 275.9 lb

## 2014-11-07 DIAGNOSIS — D509 Iron deficiency anemia, unspecified: Secondary | ICD-10-CM

## 2014-11-07 DIAGNOSIS — J019 Acute sinusitis, unspecified: Secondary | ICD-10-CM

## 2014-11-07 DIAGNOSIS — L4050 Arthropathic psoriasis, unspecified: Secondary | ICD-10-CM

## 2014-11-07 DIAGNOSIS — J018 Other acute sinusitis: Secondary | ICD-10-CM | POA: Insufficient documentation

## 2014-11-07 HISTORY — DX: Acute sinusitis, unspecified: J01.90

## 2014-11-07 MED ORDER — AMOXICILLIN-POT CLAVULANATE 875-125 MG PO TABS: 1.0000 | ORAL_TABLET | Freq: Two times a day (BID) | ORAL | Status: DC

## 2014-11-07 MED ORDER — TRIAMCINOLONE ACETONIDE 55 MCG/ACT NA AERO: 2.0000 | INHALATION_SPRAY | Freq: Every day | NASAL | Status: DC

## 2014-11-07 NOTE — Telephone Encounter (Signed)
Pt confirmed labs/ov per 12/29 POF, gave pt AVS.... KJ, sent msg to add chemo °

## 2014-11-07 NOTE — Telephone Encounter (Signed)
Per staff message and POF I have scheduled appts. Advised scheduler of appts. JMW  

## 2014-11-08 ENCOUNTER — Telehealth: Payer: Self-pay | Admitting: *Deleted

## 2014-11-08 NOTE — Telephone Encounter (Signed)
Faxed notification from pharmacy that Augmentin can cause increase in serum levels of methotrexate (Otrexup). Patient takes injection every Sunday. Per Dr. Bertis RuddyGorsuch : Hold Sunday dose of MTX once while on Augmentin.  Patient and pharmacy notified.

## 2014-11-08 NOTE — Assessment & Plan Note (Signed)
He was treated with steroids, methotrexate and most recently was placed on Remicade All these agents can cause profound anemia with bone marrow suppressive effects. I will defer to his rheumatologist for treatment. In the mean time, I will attempt to treat his anemia. [name] recommended to take folic acid supplement 

## 2014-11-08 NOTE — Assessment & Plan Note (Signed)
He has recent productive cough with associated sore throat and sinus pressure. Due to his immunocompromised state, I recommended a course of antibiotics. Due to potential interaction with methotrexate, he will hold 1 dose of methotrexate while on antibiotics.

## 2014-11-08 NOTE — Progress Notes (Signed)
Joseph [name] OFFICE PROGRESS NOTE  Joseph HectorNYLAND,[name], MD SUMMARY OF HEMATOLOGIC HISTORY:  He was found to have abnormal CBC from routine blood work monitoring. His most recent blood work ranged from 7.8 to 8.5. His labs from January 2015 was 11.6 He denies recent chest pain on exertion, pre-syncopal episodes, or palpitations. He complained of fatigue, dizzy and had leg cramps His last colonoscopy was in January 2015. He has psoriatic arthritis and had been on various treatments including steroids, remicade and methotrexate. In November 2015, he was given 1 dose of IV iron INTERVAL HISTORY: Joseph [name] 33 y.o. male returns for follow-up. He has persistent pica. The patient denies any recent signs or symptoms of bleeding such as spontaneous epistaxis, hematuria or hematochezia.  His energy level has improved a little bit. Recently, he developed acute sinus infection with sinus pressure, sore throat and productive yellowish sputum. He has not seen a primary care provider for this.   I have reviewed the past medical history, past surgical history, social history and family history with the patient and they are unchanged from previous note.  ALLERGIES:  is allergic to sulfonamide derivatives.  MEDICATIONS:  Current Outpatient Prescriptions  Medication Sig Dispense Refill  . clobetasol cream (TEMOVATE) 0.05 % Apply topically 2 (two) times daily as needed.    . folic acid (FOLVITE) 1 MG tablet Take 2 mg by mouth daily.    . InFLIXimab (REMICADE IV) Inject into the vein. Every 6 weeks per Dr. Corliss Skainseveshwar    . OTREXUP 10 MG/0.4ML SOAJ Inject 10 mg into the skin once a week.    Marland Kitchen. amoxicillin-clavulanate (AUGMENTIN) 875-125 MG per tablet Take 1 tablet by mouth 2 (two) times daily. 20 tablet 0  . triamcinolone (NASACORT AQ) 55 MCG/ACT AERO nasal inhaler Place 2 sprays into the nose daily. 1 Inhaler 0   No current facility-administered medications for this visit.     REVIEW OF  SYSTEMS:   Constitutional: Denies fevers, chills or night sweats Eyes: Denies blurriness of vision Respiratory: Denies  dyspnea or wheezes Cardiovascular: Denies palpitation, chest discomfort or lower extremity swelling Gastrointestinal:  Denies nausea, heartburn or change in bowel habits Skin: Denies abnormal skin rashes Lymphatics: Denies new lymphadenopathy or easy bruising Neurological:Denies numbness, tingling or new weaknesses Behavioral/Psych: Mood is stable, no new changes  All other systems were reviewed with the patient and are negative.  PHYSICAL EXAMINATION: ECOG PERFORMANCE STATUS: 1 - Symptomatic but completely ambulatory  Filed Vitals:   11/07/14 1304  BP: 128/86  Pulse: 74  Temp: 98.2 F (36.8 C)   Filed Weights   11/07/14 1304  Weight: 275 lb 14.4 oz (125.147 kg)    GENERAL:alert, no distress and comfortable. [name] morbidly obese SKIN: skin color, texture, turgor are normal, no rashes or significant lesions EYES: normal, Conjunctiva are pink and non-injected, sclera clear OROPHARYNX:no exudate, no erythema and lips, buccal mucosa, and tongue normal . Noted erythema at the posterior pharynx area NECK: supple, thyroid normal size, non-tender, without nodularity LYMPH:  no palpable lymphadenopathy in the cervical, axillary or inguinal LUNGS: clear to auscultation and percussion with normal breathing effort HEART: regular rate & rhythm and no murmurs and no lower extremity edema ABDOMEN:abdomen soft, non-tender and normal bowel sounds Musculoskeletal:no cyanosis of digits and no clubbing  NEURO: alert & oriented x 3 with fluent speech, no focal motor/sensory deficits  LABORATORY DATA:  I have reviewed the data as listed No results found for this or any previous visit (from  the past 48 hour(s)).  Lab Results  Component Value Date   WBC 9.0 10/25/2014   HGB 9.6* 10/25/2014   HCT 32.8* 10/25/2014   MCV 73.8* 10/25/2014   PLT 210 10/25/2014    ASSESSMENT &  PLAN:  Iron deficiency anemia The most likely cause of his anemia is due to chronic blood loss/malabsorption syndrome all bone marrow suppression from chronic immunosuppressive therapy. We discussed some of the risks, benefits, and alternatives of intravenous iron infusions. The patient is symptomatic from anemia and the iron level is critically low. He tolerated oral iron supplement poorly and desires to achieved higher levels of iron faster for adequate hematopoesis. Some of the side-effects to be expected including risks of infusion reactions, phlebitis, headaches, nausea and fatigue.  The patient is willing to proceed. Patient education material was dispensed.  Goal is to keep ferritin level greater than 50. I plan to give him 2 doses of IV iron next 2 weeks and recommend he continues taking folic acid.   Psoriatic arthritis He was treated with steroids, methotrexate and most recently was placed on Remicade All these agents can cause profound anemia with bone marrow suppressive effects. I will defer to his rheumatologist for treatment. In the mean time, I will attempt to treat his anemia. [name] recommended to take folic acid supplement  Acute sinus infection He has recent productive cough with associated sore throat and sinus pressure. Due to his immunocompromised state, I recommended a course of antibiotics. Due to potential interaction with methotrexate, he will hold 1 dose of methotrexate while on antibiotics.  The patient will also return on a monthly basis to get his blood checked as I suspect he may need more iron infusions in the future. All questions were answered. The patient knows to call the clinic with any problems, questions or concerns. No barriers to learning was detected.  I spent 30 minutes counseling the patient face to face. The total time spent in the appointment was 40 minutes and more than 50% was on counseling.     [facility]ORSUCH, Shahir Karen, MD 11/08/2014 8:19 PM

## 2014-11-08 NOTE — Assessment & Plan Note (Signed)
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 We discussed some of the risks, benefits, and alternatives of intravenous iron infusions. The patient is symptomatic from anemia and the iron level is critically low. He tolerated oral iron supplement poorly and desires to achieved higher levels of iron faster for adequate hematopoesis. Some of the side-effects to be expected including risks of infusion reactions, phlebitis, headaches, nausea and fatigue.  The patient is willing to proceed. Patient education material was dispensed.  Goal is to keep ferritin level greater than 50. I plan to give him 2 doses of IV iron next 2 weeks and recommend he continues taking folic acid.

## 2014-11-15 ENCOUNTER — Telehealth: Payer: Self-pay | Admitting: Hematology and Oncology

## 2014-11-15 NOTE — Telephone Encounter (Signed)
returned pt call lvm for pt regarding to 1.8 appt...Marland Kitchen..Marland Kitchen

## 2014-11-17 ENCOUNTER — Ambulatory Visit (HOSPITAL_BASED_OUTPATIENT_CLINIC_OR_DEPARTMENT_OTHER): Payer: 59

## 2014-11-17 DIAGNOSIS — D509 Iron deficiency anemia, unspecified: Secondary | ICD-10-CM

## 2014-11-17 MED ORDER — SODIUM CHLORIDE 0.9 % IV SOLN
Freq: Once | INTRAVENOUS | Status: AC
  Administered 2014-11-17: 09:00:00 via INTRAVENOUS

## 2014-11-17 MED ORDER — SODIUM CHLORIDE 0.9 % IV SOLN
510.0000 mg | Freq: Once | INTRAVENOUS | Status: AC
  Administered 2014-11-17: 510 mg via INTRAVENOUS
  Filled 2014-11-17: qty 17

## 2014-11-17 NOTE — Patient Instructions (Signed)

## 2014-11-24 ENCOUNTER — Ambulatory Visit (HOSPITAL_BASED_OUTPATIENT_CLINIC_OR_DEPARTMENT_OTHER): Payer: 59

## 2014-11-24 DIAGNOSIS — D509 Iron deficiency anemia, unspecified: Secondary | ICD-10-CM

## 2014-11-24 MED ORDER — SODIUM CHLORIDE 0.9 % IV SOLN
510.0000 mg | Freq: Once | INTRAVENOUS | Status: AC
  Administered 2014-11-24: 510 mg via INTRAVENOUS
  Filled 2014-11-24: qty 17

## 2014-11-24 MED ORDER — SODIUM CHLORIDE 0.9 % IV SOLN
Freq: Once | INTRAVENOUS | Status: AC
  Administered 2014-11-24: 09:00:00 via INTRAVENOUS

## 2014-11-24 NOTE — Patient Instructions (Signed)

## 2014-11-24 NOTE — Progress Notes (Signed)
Patient states he is feeling a little better since last iron on this past Friday.  He has a little more energy.

## 2014-12-14 ENCOUNTER — Other Ambulatory Visit (HOSPITAL_COMMUNITY): Payer: Self-pay | Admitting: *Deleted

## 2014-12-15 ENCOUNTER — Encounter (HOSPITAL_COMMUNITY)
Admission: RE | Admit: 2014-12-15 | Discharge: 2014-12-15 | Disposition: A | Discharge status: Home / Self Care | Payer: 59 | Source: Ambulatory Visit | Attending: Rheumatology | Admitting: Rheumatology

## 2014-12-15 DIAGNOSIS — L4050 Arthropathic psoriasis, unspecified: Secondary | ICD-10-CM | POA: Insufficient documentation

## 2014-12-15 MED ORDER — DIPHENHYDRAMINE HCL 25 MG PO TABS
50.0000 mg | ORAL_TABLET | ORAL | Status: DC
  Filled 2014-12-15: qty 2

## 2014-12-15 MED ORDER — METHYLPREDNISOLONE SODIUM SUCC 125 MG IJ SOLR: 125.0000 mg | INTRAMUSCULAR | Status: DC

## 2014-12-15 MED ORDER — ACETAMINOPHEN 325 MG PO TABS
ORAL_TABLET | ORAL | Status: AC
  Administered 2014-12-15: 650 mg
  Filled 2014-12-15: qty 2

## 2014-12-15 MED ORDER — DIPHENHYDRAMINE HCL 25 MG PO CAPS
ORAL_CAPSULE | ORAL | Status: AC
  Administered 2014-12-15: 50 mg
  Filled 2014-12-15: qty 2

## 2014-12-15 MED ORDER — METHYLPREDNISOLONE SODIUM SUCC 125 MG IJ SOLR
INTRAMUSCULAR | Status: AC
  Administered 2014-12-15: 125 mg
  Filled 2014-12-15: qty 2

## 2014-12-15 MED ORDER — ACETAMINOPHEN 325 MG PO TABS: 650.0000 mg | ORAL_TABLET | ORAL | Status: DC

## 2014-12-15 MED ORDER — SODIUM CHLORIDE 0.9 % IV SOLN: INTRAVENOUS | Status: DC

## 2014-12-15 MED ORDER — SODIUM CHLORIDE 0.9 % IV SOLN
400.0000 mg | INTRAVENOUS | Status: DC
  Administered 2014-12-15: 400 mg via INTRAVENOUS
  Filled 2014-12-15: qty 40

## 2014-12-15 NOTE — Progress Notes (Signed)
Called office because patient states that he has never had premeds before Remicade infusions in the past. Orders received to continue with today's infusion and have patient call office for instructions for next time. Patient verbalizes understanding.

## 2014-12-22 ENCOUNTER — Other Ambulatory Visit (HOSPITAL_BASED_OUTPATIENT_CLINIC_OR_DEPARTMENT_OTHER): Payer: 59

## 2014-12-22 DIAGNOSIS — D509 Iron deficiency anemia, unspecified: Secondary | ICD-10-CM

## 2014-12-22 LAB — CBC & DIFF AND RETIC
BASO%: 0.1 % (ref 0.0–2.0)
Basophils Absolute: 0 10*3/uL (ref 0.0–0.1)
EOS%: 3.4 % (ref 0.0–7.0)
Eosinophils Absolute: 0.2 10*3/uL (ref 0.0–0.5)
HCT: 42.5 % (ref 38.4–49.9)
HGB: 13.2 g/dL (ref 13.0–17.1)
Immature Retic Fract: 5.9 % (ref 3.00–10.60)
LYMPH%: 30.8 % (ref 14.0–49.0)
MCH: 25.5 pg — ABNORMAL LOW (ref 27.2–33.4)
MCHC: 31.1 g/dL — AB (ref 32.0–36.0)
MCV: 82.2 fL (ref 79.3–98.0)
MONO#: 0.6 10*3/uL (ref 0.1–0.9)
MONO%: 8.7 % (ref 0.0–14.0)
NEUT#: 3.9 10*3/uL (ref 1.5–6.5)
NEUT%: 57 % (ref 39.0–75.0)
PLATELETS: 181 10*3/uL (ref 140–400)
RBC: 5.17 10*6/uL (ref 4.20–5.82)
RDW: 20.3 % — ABNORMAL HIGH (ref 11.0–14.6)
RETIC %: 1.2 % (ref 0.80–1.80)
Retic Ct Abs: 62.04 10*3/uL (ref 34.80–93.90)
WBC: 6.9 10*3/uL (ref 4.0–10.3)
lymph#: 2.1 10*3/uL (ref 0.9–3.3)

## 2014-12-22 LAB — FERRITIN CHCC: Ferritin: 33 ng/ml (ref 22–316)

## 2014-12-29 ENCOUNTER — Other Ambulatory Visit: Payer: Self-pay | Admitting: Hematology and Oncology

## 2014-12-29 ENCOUNTER — Ambulatory Visit (HOSPITAL_BASED_OUTPATIENT_CLINIC_OR_DEPARTMENT_OTHER): Payer: 59

## 2014-12-29 DIAGNOSIS — D509 Iron deficiency anemia, unspecified: Secondary | ICD-10-CM

## 2014-12-29 MED ORDER — SODIUM CHLORIDE 0.9 % IV SOLN
Freq: Once | INTRAVENOUS | Status: AC
  Administered 2014-12-29: 09:00:00 via INTRAVENOUS

## 2014-12-29 MED ORDER — HEPARIN SOD (PORK) LOCK FLUSH 100 UNIT/ML IV SOLN
250.0000 [IU] | Freq: Once | INTRAVENOUS | Status: DC | PRN
  Filled 2014-12-29: qty 5

## 2014-12-29 MED ORDER — SODIUM CHLORIDE 0.9 % IJ SOLN
3.0000 mL | Freq: Once | INTRAMUSCULAR | Status: DC | PRN
  Filled 2014-12-29: qty 10

## 2014-12-29 MED ORDER — SODIUM CHLORIDE 0.9 % IV SOLN
510.0000 mg | Freq: Once | INTRAVENOUS | Status: AC
  Administered 2014-12-29: 510 mg via INTRAVENOUS
  Filled 2014-12-29: qty 17

## 2014-12-29 MED ORDER — SODIUM CHLORIDE 0.9 % IJ SOLN
10.0000 mL | INTRAMUSCULAR | Status: DC | PRN
  Filled 2014-12-29: qty 10

## 2014-12-29 MED ORDER — ALTEPLASE 2 MG IJ SOLR
2.0000 mg | Freq: Once | INTRAMUSCULAR | Status: DC | PRN
  Filled 2014-12-29: qty 2

## 2014-12-29 MED ORDER — HEPARIN SOD (PORK) LOCK FLUSH 100 UNIT/ML IV SOLN
500.0000 [IU] | Freq: Once | INTRAVENOUS | Status: DC | PRN
  Filled 2014-12-29: qty 5

## 2014-12-29 NOTE — Patient Instructions (Signed)

## 2015-01-05 ENCOUNTER — Ambulatory Visit (HOSPITAL_BASED_OUTPATIENT_CLINIC_OR_DEPARTMENT_OTHER): Payer: 59

## 2015-01-05 DIAGNOSIS — D509 Iron deficiency anemia, unspecified: Secondary | ICD-10-CM

## 2015-01-05 MED ORDER — SODIUM CHLORIDE 0.9 % IV SOLN
Freq: Once | INTRAVENOUS | Status: AC
  Administered 2015-01-05: 10:00:00 via INTRAVENOUS

## 2015-01-05 MED ORDER — SODIUM CHLORIDE 0.9 % IV SOLN
510.0000 mg | Freq: Once | INTRAVENOUS | Status: AC
  Administered 2015-01-05: 510 mg via INTRAVENOUS
  Filled 2015-01-05: qty 17

## 2015-01-05 NOTE — Patient Instructions (Signed)

## 2015-02-02 ENCOUNTER — Other Ambulatory Visit (HOSPITAL_BASED_OUTPATIENT_CLINIC_OR_DEPARTMENT_OTHER): Payer: 59

## 2015-02-02 DIAGNOSIS — D509 Iron deficiency anemia, unspecified: Secondary | ICD-10-CM | POA: Diagnosis not present

## 2015-02-02 LAB — CBC & DIFF AND RETIC
BASO%: 0.2 % (ref 0.0–2.0)
BASOS ABS: 0 10*3/uL (ref 0.0–0.1)
EOS%: 1.5 % (ref 0.0–7.0)
Eosinophils Absolute: 0.1 10*3/uL (ref 0.0–0.5)
HCT: 43 % (ref 38.4–49.9)
HEMOGLOBIN: 14 g/dL (ref 13.0–17.1)
IMMATURE RETIC FRACT: 5.3 % (ref 3.00–10.60)
LYMPH%: 25.3 % (ref 14.0–49.0)
MCH: 27.5 pg (ref 27.2–33.4)
MCHC: 32.6 g/dL (ref 32.0–36.0)
MCV: 84.3 fL (ref 79.3–98.0)
MONO#: 0.7 10*3/uL (ref 0.1–0.9)
MONO%: 8.5 % (ref 0.0–14.0)
NEUT#: 5.5 10*3/uL (ref 1.5–6.5)
NEUT%: 64.5 % (ref 39.0–75.0)
Platelets: 180 10*3/uL (ref 140–400)
RBC: 5.1 10*6/uL (ref 4.20–5.82)
RDW: 18.7 % — AB (ref 11.0–14.6)
Retic %: 1.17 % (ref 0.80–1.80)
Retic Ct Abs: 59.67 10*3/uL (ref 34.80–93.90)
WBC: 8.5 10*3/uL (ref 4.0–10.3)
lymph#: 2.1 10*3/uL (ref 0.9–3.3)

## 2015-02-02 LAB — FERRITIN CHCC: FERRITIN: 58 ng/mL (ref 22–316)

## 2015-02-06 ENCOUNTER — Encounter: Payer: Self-pay | Admitting: Hematology and Oncology

## 2015-02-06 ENCOUNTER — Ambulatory Visit (HOSPITAL_BASED_OUTPATIENT_CLINIC_OR_DEPARTMENT_OTHER): Payer: 59 | Admitting: Hematology and Oncology

## 2015-02-06 VITALS — BP 130/83 | HR 67 | Temp 97.8°F | Resp 18 | Ht 71.0 in | Wt 285.5 lb

## 2015-02-06 DIAGNOSIS — D509 Iron deficiency anemia, unspecified: Secondary | ICD-10-CM

## 2015-02-06 DIAGNOSIS — M898X9 Other specified disorders of bone, unspecified site: Secondary | ICD-10-CM

## 2015-02-06 DIAGNOSIS — G8929 Other chronic pain: Secondary | ICD-10-CM

## 2015-02-06 NOTE — Assessment & Plan Note (Signed)
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 After multiple doses of IV iron, we finally is able to correct his anemia and keep ferritin above 50. I gave him an order to have his blood checked with his rheumatologist every other month and have the results faxed to me. I will see him back again and rescheduled for the iron infusion in the patient become anemic or if ferritin dropped to less than 50. He agreed with the plan of care.

## 2015-02-06 NOTE — Progress Notes (Signed)
[facility] OFFICE PROGRESS NOTE  Joseph Hector, MD SUMMARY OF HEMATOLOGIC HISTORY: He was found to have abnormal CBC from routine blood work monitoring. His most recent blood work ranged from 7.8 to 8.5. His labs from January 2015 was 11.6 He denies recent chest pain on exertion, pre-syncopal episodes, or palpitations. He complained of fatigue, dizzy and had leg cramps His last colonoscopy was in January 2015. He has psoriatic arthritis and had been on various treatments including steroids, remicade and methotrexate. The patient received multiple are in infusion in November 2015, January 2016 and Februaryy 2016. INTERVAL HISTORY: Joseph [name] 34 y.o. male returns for further follow-up. He feels fine. He continues to have chronic bone pain and is currently being treated by his rheumatologist. The patient denies any recent signs or symptoms of bleeding such as spontaneous epistaxis, hematuria or hematochezia.   I have reviewed the past medical history, past surgical history, social history and family history with the patient and they are unchanged from previous note.  ALLERGIES:  is allergic to sulfonamide derivatives.  MEDICATIONS:  Current Outpatient Prescriptions  Medication Sig Dispense Refill  . clobetasol cream (TEMOVATE) 0.05 % Apply topically 2 (two) times daily as needed.    . folic acid (FOLVITE) 1 MG tablet Take 2 mg by mouth daily.    . InFLIXimab (REMICADE IV) Inject into the vein. Every 8 weeks per Dr. Corliss Skains    . OTREXUP 10 MG/0.4ML SOAJ Inject 10 mg into the skin once a week.    . predniSONE (DELTASONE) 5 MG tablet Tapering dose pack     No current facility-administered medications for this visit.     REVIEW OF SYSTEMS:   Constitutional: Denies fevers, chills or night sweats Eyes: Denies blurriness of vision Ears, nose, mouth, throat, and face: Denies mucositis or sore throat Respiratory: Denies cough, dyspnea or wheezes Cardiovascular:  Denies palpitation, chest discomfort or lower extremity swelling Gastrointestinal:  Denies nausea, heartburn or change in bowel habits Skin: Denies abnormal skin rashes Lymphatics: Denies new lymphadenopathy or easy bruising Neurological:Denies numbness, tingling or new weaknesses Behavioral/Psych: Mood is stable, no new changes  All other systems were reviewed with the patient and are negative.  PHYSICAL EXAMINATION: ECOG PERFORMANCE STATUS: 0 - Asymptomatic  Filed Vitals:   02/06/15 1104  BP: 130/83  Pulse: 67  Temp: 97.8 F (36.6 C)  Resp: 18   Filed Weights   02/06/15 1104  Weight: 285 lb 8 oz (129.502 kg)    GENERAL:alert, no distress and comfortable SKIN: skin color, texture, turgor are normal, no rashes or significant lesions EYES: normal, Conjunctiva are pink and non-injected, sclera clear Musculoskeletal:no cyanosis of digits and no clubbing  NEURO: alert & oriented x 3 with fluent speech, no focal motor/sensory deficits  LABORATORY DATA:  I have reviewed the data as listed No results found for this or any previous visit (from the past 48 hour(s)).  Lab Results  Component Value Date   WBC 8.5 02/02/2015   HGB 14.0 02/02/2015   HCT 43.0 02/02/2015   MCV 84.3 02/02/2015   PLT 180 02/02/2015    ASSESSMENT & PLAN:  Iron deficiency anemia The most likely cause of his anemia is due to chronic blood loss/malabsorption syndrome all bone marrow suppression from chronic immunosuppressive therapy. After multiple doses of IV iron, we finally is able to correct his anemia and keep ferritin above 50. I gave him an order to have his blood checked with his rheumatologist every other month and have  the results faxed to me. I will see him back again and rescheduled for the iron infusion in the patient become anemic or if ferritin dropped to less than 50. He agreed with the plan of care.     All questions were answered. The patient knows to call the clinic with any problems,  questions or concerns. No barriers to learning was detected.  I spent 15 minutes counseling the patient face to face. The total time spent in the appointment was 20 minutes and more than 50% was on counseling.     [facility] Center[facility]ORSUCH, Suresh Audi, MD 3/29/20161:18 PM

## 2015-02-08 ENCOUNTER — Other Ambulatory Visit (HOSPITAL_COMMUNITY): Payer: Self-pay | Admitting: *Deleted

## 2015-02-09 ENCOUNTER — Encounter (HOSPITAL_COMMUNITY)
Admission: RE | Admit: 2015-02-09 | Discharge: 2015-02-09 | Disposition: A | Discharge status: Home / Self Care | Payer: 59 | Source: Ambulatory Visit | Attending: Rheumatology | Admitting: Rheumatology

## 2015-02-09 DIAGNOSIS — L4050 Arthropathic psoriasis, unspecified: Secondary | ICD-10-CM | POA: Diagnosis present

## 2015-02-09 MED ORDER — METHYLPREDNISOLONE SODIUM SUCC 125 MG IJ SOLR
INTRAMUSCULAR | Status: AC
  Filled 2015-02-09: qty 2

## 2015-02-09 MED ORDER — DIPHENHYDRAMINE HCL 25 MG PO TABS
50.0000 mg | ORAL_TABLET | Freq: Once | ORAL | Status: AC
  Administered 2015-02-09: 50 mg via ORAL
  Filled 2015-02-09: qty 2

## 2015-02-09 MED ORDER — METHYLPREDNISOLONE SODIUM SUCC 125 MG IJ SOLR
125.0000 mg | Freq: Once | INTRAMUSCULAR | Status: AC
  Administered 2015-02-09: 125 mg via INTRAVENOUS

## 2015-02-09 MED ORDER — SODIUM CHLORIDE 0.9 % IV SOLN
INTRAVENOUS | Status: DC
  Administered 2015-02-09: 12:00:00 via INTRAVENOUS

## 2015-02-09 MED ORDER — LORATADINE 10 MG PO TABS
10.0000 mg | ORAL_TABLET | Freq: Every day | ORAL | Status: DC
  Administered 2015-02-09: 10 mg via ORAL

## 2015-02-09 MED ORDER — LORATADINE 10 MG PO TABS
ORAL_TABLET | ORAL | Status: AC
  Filled 2015-02-09: qty 1

## 2015-02-09 MED ORDER — DIPHENHYDRAMINE HCL 25 MG PO CAPS
ORAL_CAPSULE | ORAL | Status: AC
  Filled 2015-02-09: qty 2

## 2015-02-09 MED ORDER — ACETAMINOPHEN 325 MG PO TABS
650.0000 mg | ORAL_TABLET | Freq: Four times a day (QID) | ORAL | Status: DC | PRN
  Administered 2015-02-09: 650 mg via ORAL

## 2015-02-09 MED ORDER — SODIUM CHLORIDE 0.9 % IV SOLN
400.0000 mg | INTRAVENOUS | Status: DC
  Administered 2015-02-09: 400 mg via INTRAVENOUS
  Filled 2015-02-09: qty 40

## 2015-02-09 MED ORDER — ACETAMINOPHEN 325 MG PO TABS
ORAL_TABLET | ORAL | Status: AC
  Filled 2015-02-09: qty 2

## 2015-02-09 MED ORDER — FAMOTIDINE 20 MG PO TABS
30.0000 mg | ORAL_TABLET | Freq: Once | ORAL | Status: AC
  Administered 2015-02-09: 30 mg via ORAL
  Filled 2015-02-09: qty 1.5

## 2015-03-09 ENCOUNTER — Telehealth: Payer: Self-pay

## 2015-03-09 NOTE — Telephone Encounter (Signed)
Pt at another MD office and has a letter asking that a CBC/diff and ferritin be drawn. Gave VO for this lab draw, gave Dx code, [facility].

## 2015-03-15 ENCOUNTER — Telehealth: Payer: Self-pay | Admitting: *Deleted

## 2015-03-15 NOTE — Telephone Encounter (Signed)
Received CBC from [facility].  Ferritin is 22 and per Dr. Bertis RuddyGorsuch, pt needs IV iron scheduled as soon as pt can come.   Pt states he can come on Friday 5/13 in the morning.  Request sent to scheduler and instructed pt to expect a call from Scheduling w/ the time.  He verbalized understanding.

## 2015-03-16 ENCOUNTER — Telehealth: Payer: Self-pay | Admitting: Hematology and Oncology

## 2015-03-16 NOTE — Telephone Encounter (Signed)
lvm for pt regarding to May appt... °

## 2015-03-20 ENCOUNTER — Telehealth: Payer: Self-pay | Admitting: *Deleted

## 2015-03-20 NOTE — Telephone Encounter (Signed)
Patient called and left message to change his appts. I have called and left him a message to call us back

## 2015-03-20 NOTE — Telephone Encounter (Signed)
I have called the patient back and gave him his appt for Friday

## 2015-03-22 ENCOUNTER — Other Ambulatory Visit: Payer: Self-pay | Admitting: Hematology and Oncology

## 2015-03-23 ENCOUNTER — Telehealth: Payer: Self-pay | Admitting: *Deleted

## 2015-03-23 ENCOUNTER — Ambulatory Visit (HOSPITAL_BASED_OUTPATIENT_CLINIC_OR_DEPARTMENT_OTHER): Payer: BLUE CROSS/BLUE SHIELD

## 2015-03-23 VITALS — BP 117/67 | HR 77 | Temp 98.0°F | Resp 18

## 2015-03-23 DIAGNOSIS — D509 Iron deficiency anemia, unspecified: Secondary | ICD-10-CM

## 2015-03-23 MED ORDER — SODIUM CHLORIDE 0.9 % IV SOLN
510.0000 mg | Freq: Once | INTRAVENOUS | Status: AC
  Administered 2015-03-23: 510 mg via INTRAVENOUS
  Filled 2015-03-23: qty 17

## 2015-03-23 MED ORDER — SODIUM CHLORIDE 0.9 % IV SOLN
INTRAVENOUS | Status: DC
  Administered 2015-03-23: 09:00:00 via INTRAVENOUS

## 2015-03-23 NOTE — Patient Instructions (Signed)

## 2015-03-23 NOTE — Telephone Encounter (Signed)
-----   Message from Charma Igoiane H Bell, RN sent at 03/23/2015 10:22 AM EDT ----- He wants to know his last cbc from outside lab. It is not scanned in yet. Call him on mobilie

## 2015-03-23 NOTE — Telephone Encounter (Signed)
Pt was asking for results of CBC.   I had documented his ferritin as 22 and sent the lab to HIM to be scanned into EMR.  It is not in EMR yet.  I left pt VM informing him I can't remember CBC results, just the ferritin was 22.  He can call [facility] for the results or call nurse back in a week or two to see if it has been scanned into our system yet.

## 2015-03-30 ENCOUNTER — Ambulatory Visit (HOSPITAL_BASED_OUTPATIENT_CLINIC_OR_DEPARTMENT_OTHER): Payer: BLUE CROSS/BLUE SHIELD

## 2015-03-30 VITALS — BP 121/73 | HR 70 | Temp 97.6°F | Resp 18

## 2015-03-30 DIAGNOSIS — K909 Intestinal malabsorption, unspecified: Secondary | ICD-10-CM

## 2015-03-30 DIAGNOSIS — D509 Iron deficiency anemia, unspecified: Secondary | ICD-10-CM | POA: Diagnosis not present

## 2015-03-30 MED ORDER — SODIUM CHLORIDE 0.9 % IV SOLN
510.0000 mg | Freq: Once | INTRAVENOUS | Status: AC
  Administered 2015-03-30: 510 mg via INTRAVENOUS
  Filled 2015-03-30: qty 17

## 2015-03-30 MED ORDER — SODIUM CHLORIDE 0.9 % IV SOLN
Freq: Once | INTRAVENOUS | Status: AC
  Administered 2015-03-30: 10:00:00 via INTRAVENOUS

## 2015-03-30 NOTE — Patient Instructions (Signed)

## 2015-04-05 ENCOUNTER — Other Ambulatory Visit (HOSPITAL_COMMUNITY): Payer: Self-pay | Admitting: *Deleted

## 2015-04-06 ENCOUNTER — Encounter (HOSPITAL_COMMUNITY): Payer: Self-pay

## 2015-04-11 ENCOUNTER — Encounter: Payer: Self-pay | Admitting: Hematology and Oncology

## 2015-04-13 ENCOUNTER — Encounter: Payer: Self-pay | Admitting: *Deleted

## 2015-04-18 ENCOUNTER — Telehealth: Payer: Self-pay | Admitting: *Deleted

## 2015-04-18 NOTE — Telephone Encounter (Signed)
VM from RN Case Manager regarding a claim for pt's IV Iron.  She had some specific questions and asked for nurse to return her call at phone #[phone].   I called back and left her a VM asking her to please call again and leave detailed message or questions if she reaches our VM again.

## 2015-04-19 ENCOUNTER — Other Ambulatory Visit (HOSPITAL_COMMUNITY): Payer: Self-pay | Admitting: *Deleted

## 2015-04-19 ENCOUNTER — Telehealth: Payer: Self-pay | Admitting: *Deleted

## 2015-04-19 NOTE — Telephone Encounter (Signed)
-----   Message from Rolanda Jay, RN sent at 04/18/2015  4:38 PM EDT ----- Regarding: payment for Feraheme Case Manager asking if pt has ever been on any oral iron before or any other IV iron before Feraheme?   Fleurette Woolbright,  The case manager's phone number is [phone] and her name is Annia Belt.   Thanks, Cameo

## 2015-04-19 NOTE — Telephone Encounter (Signed)
Left message from Dr Bertis Ruddy. Pt has taken oral iron befroe

## 2015-04-20 ENCOUNTER — Encounter (HOSPITAL_COMMUNITY)
Admission: RE | Admit: 2015-04-20 | Discharge: 2015-04-20 | Disposition: A | Discharge status: Home / Self Care | Payer: BLUE CROSS/BLUE SHIELD | Source: Ambulatory Visit | Attending: Rheumatology | Admitting: Rheumatology

## 2015-04-20 DIAGNOSIS — L4050 Arthropathic psoriasis, unspecified: Secondary | ICD-10-CM | POA: Diagnosis not present

## 2015-04-20 LAB — COMPREHENSIVE METABOLIC PANEL
ALT: 17 U/L (ref 17–63)
AST: 22 U/L (ref 15–41)
Albumin: 3.5 g/dL (ref 3.5–5.0)
Alkaline Phosphatase: 72 U/L (ref 38–126)
Anion gap: 10 (ref 5–15)
BILIRUBIN TOTAL: 1 mg/dL (ref 0.3–1.2)
BUN: 9 mg/dL (ref 6–20)
CHLORIDE: 101 mmol/L (ref 101–111)
CO2: 26 mmol/L (ref 22–32)
Calcium: 8.7 mg/dL — ABNORMAL LOW (ref 8.9–10.3)
Creatinine, Ser: 0.98 mg/dL (ref 0.61–1.24)
GFR calc Af Amer: >60 mL/min (ref >60)
Glucose, Bld: 113 mg/dL — ABNORMAL HIGH (ref 65–99)
POTASSIUM: 3.5 mmol/L (ref 3.5–5.1)
Sodium: 137 mmol/L (ref 135–145)
TOTAL PROTEIN: 6.9 g/dL (ref 6.5–8.1)

## 2015-04-20 LAB — DIFFERENTIAL
BASOS PCT: 0 % (ref 0–1)
Basophils Absolute: 0 10*3/uL (ref 0.0–0.1)
Eosinophils Absolute: 0.1 10*3/uL (ref 0.0–0.7)
Eosinophils Relative: 1 % (ref 0–5)
Lymphocytes Relative: 20 % (ref 12–46)
Lymphs Abs: 2.2 10*3/uL (ref 0.7–4.0)
MONOS PCT: 8 % (ref 3–12)
Monocytes Absolute: 0.9 10*3/uL (ref 0.1–1.0)
NEUTROS ABS: 7.7 10*3/uL (ref 1.7–7.7)
Neutrophils Relative %: 71 % (ref 43–77)

## 2015-04-20 LAB — CBC
HEMATOCRIT: 41.2 % (ref 39.0–52.0)
Hemoglobin: 13.7 g/dL (ref 13.0–17.0)
MCH: 29 pg (ref 26.0–34.0)
MCHC: 33.3 g/dL (ref 30.0–36.0)
MCV: 87.3 fL (ref 78.0–100.0)
Platelets: 221 10*3/uL (ref 150–400)
RBC: 4.72 MIL/uL (ref 4.22–5.81)
RDW: 16.5 % — AB (ref 11.5–15.5)
WBC: 10.9 10*3/uL — ABNORMAL HIGH (ref 4.0–10.5)

## 2015-04-20 MED ORDER — SODIUM CHLORIDE 0.9 % IV SOLN
INTRAVENOUS | Status: DC
  Administered 2015-04-20: 10:00:00 via INTRAVENOUS

## 2015-04-20 MED ORDER — ACETAMINOPHEN 325 MG PO TABS
ORAL_TABLET | ORAL | Status: AC
  Filled 2015-04-20: qty 2

## 2015-04-20 MED ORDER — ACETAMINOPHEN 325 MG PO TABS
650.0000 mg | ORAL_TABLET | ORAL | Status: DC
  Administered 2015-04-20: 650 mg via ORAL

## 2015-04-20 MED ORDER — DIPHENHYDRAMINE HCL 25 MG PO TABS
25.0000 mg | ORAL_TABLET | ORAL | Status: DC
  Administered 2015-04-20: 25 mg via ORAL
  Filled 2015-04-20: qty 1

## 2015-04-20 MED ORDER — SODIUM CHLORIDE 0.9 % IV SOLN
3.0000 mg/kg | INTRAVENOUS | Status: DC
  Administered 2015-04-20: 400 mg via INTRAVENOUS
  Filled 2015-04-20: qty 40

## 2015-04-20 MED ORDER — DIPHENHYDRAMINE HCL 25 MG PO CAPS
ORAL_CAPSULE | ORAL | Status: AC
  Filled 2015-04-20: qty 1

## 2015-06-14 ENCOUNTER — Telehealth: Payer: Self-pay | Admitting: *Deleted

## 2015-06-14 NOTE — Telephone Encounter (Signed)
Received denial for IV Iron,  Not covered by pt's insurance company.  Dr. Bertis Ruddy is aware pt has tried Oral Iron in the past and instructs for him to continue or resume if he has stopped.  Called pt and left VM informing him insurance will not cover IV iron and instructions to take Oral iron supplement OTC once a day at bedtime.  Please call us back if any questions or concerns.

## 2015-06-15 ENCOUNTER — Encounter (HOSPITAL_COMMUNITY): Payer: BLUE CROSS/BLUE SHIELD

## 2015-08-02 ENCOUNTER — Other Ambulatory Visit (HOSPITAL_COMMUNITY): Payer: Self-pay | Admitting: *Deleted

## 2015-08-03 ENCOUNTER — Encounter (HOSPITAL_COMMUNITY)
Admission: RE | Admit: 2015-08-03 | Discharge: 2015-08-03 | Disposition: A | Discharge status: Home / Self Care | Payer: BLUE CROSS/BLUE SHIELD | Source: Ambulatory Visit | Attending: Rheumatology | Admitting: Rheumatology

## 2015-08-03 DIAGNOSIS — L4050 Arthropathic psoriasis, unspecified: Secondary | ICD-10-CM | POA: Insufficient documentation

## 2015-08-03 LAB — COMPREHENSIVE METABOLIC PANEL
ALT: 13 U/L — AB (ref 17–63)
AST: 13 U/L — AB (ref 15–41)
Albumin: 3.2 g/dL — ABNORMAL LOW (ref 3.5–5.0)
Alkaline Phosphatase: 60 U/L (ref 38–126)
Anion gap: 8 (ref 5–15)
BUN: 6 mg/dL (ref 6–20)
CO2: 28 mmol/L (ref 22–32)
Calcium: 8.6 mg/dL — ABNORMAL LOW (ref 8.9–10.3)
Chloride: 103 mmol/L (ref 101–111)
Creatinine, Ser: 0.84 mg/dL (ref 0.61–1.24)
GFR calc Af Amer: >60 mL/min (ref >60)
GFR calc non Af Amer: >60 mL/min (ref >60)
Glucose, Bld: 109 mg/dL — ABNORMAL HIGH (ref 65–99)
Potassium: 3.8 mmol/L (ref 3.5–5.1)
Sodium: 139 mmol/L (ref 135–145)
TOTAL PROTEIN: 6.7 g/dL (ref 6.5–8.1)
Total Bilirubin: 0.4 mg/dL (ref 0.3–1.2)

## 2015-08-03 LAB — CBC
HEMATOCRIT: 39.9 % (ref 39.0–52.0)
Hemoglobin: 12.7 g/dL — ABNORMAL LOW (ref 13.0–17.0)
MCH: 28.5 pg (ref 26.0–34.0)
MCHC: 31.8 g/dL (ref 30.0–36.0)
MCV: 89.5 fL (ref 78.0–100.0)
Platelets: 284 10*3/uL (ref 150–400)
RBC: 4.46 MIL/uL (ref 4.22–5.81)
RDW: 14.2 % (ref 11.5–15.5)
WBC: 15.4 10*3/uL — AB (ref 4.0–10.5)

## 2015-08-03 LAB — DIFFERENTIAL
Basophils Absolute: 0 10*3/uL (ref 0.0–0.1)
Basophils Relative: 0 %
EOS ABS: 0 10*3/uL (ref 0.0–0.7)
Eosinophils Relative: 0 %
Lymphocytes Relative: 8 %
Lymphs Abs: 1.3 10*3/uL (ref 0.7–4.0)
Monocytes Absolute: 0.7 10*3/uL (ref 0.1–1.0)
Monocytes Relative: 4 %
NEUTROS PCT: 88 %
Neutro Abs: 13.5 10*3/uL — ABNORMAL HIGH (ref 1.7–7.7)

## 2015-08-03 MED ORDER — DIPHENHYDRAMINE HCL 25 MG PO TABS
50.0000 mg | ORAL_TABLET | ORAL | Status: DC
  Administered 2015-08-03: 50 mg via ORAL
  Filled 2015-08-03: qty 2

## 2015-08-03 MED ORDER — SODIUM CHLORIDE 0.9 % IV SOLN
INTRAVENOUS | Status: DC
  Administered 2015-08-03: 250 mL via INTRAVENOUS

## 2015-08-03 MED ORDER — SODIUM CHLORIDE 0.9 % IV SOLN
3.0000 mg/kg | INTRAVENOUS | Status: DC
  Administered 2015-08-03: 400 mg via INTRAVENOUS
  Filled 2015-08-03: qty 40

## 2015-08-03 MED ORDER — DIPHENHYDRAMINE HCL 25 MG PO CAPS
ORAL_CAPSULE | ORAL | Status: AC
  Filled 2015-08-03: qty 2

## 2015-08-03 MED ORDER — ACETAMINOPHEN 325 MG PO TABS
ORAL_TABLET | ORAL | Status: AC
  Filled 2015-08-03: qty 2

## 2015-08-03 MED ORDER — ACETAMINOPHEN 325 MG PO TABS
650.0000 mg | ORAL_TABLET | ORAL | Status: DC
  Administered 2015-08-03: 650 mg via ORAL

## 2015-09-27 ENCOUNTER — Other Ambulatory Visit (HOSPITAL_COMMUNITY): Payer: Self-pay

## 2015-09-28 ENCOUNTER — Encounter (HOSPITAL_COMMUNITY)
Admission: RE | Admit: 2015-09-28 | Discharge: 2015-09-28 | Disposition: A | Discharge status: Home / Self Care | Payer: BLUE CROSS/BLUE SHIELD | Source: Ambulatory Visit | Attending: Rheumatology | Admitting: Rheumatology

## 2015-09-28 DIAGNOSIS — L4050 Arthropathic psoriasis, unspecified: Secondary | ICD-10-CM | POA: Diagnosis present

## 2015-09-28 LAB — COMPREHENSIVE METABOLIC PANEL
ALK PHOS: 77 U/L (ref 38–126)
ALT: 12 U/L — AB (ref 17–63)
ANION GAP: 6 (ref 5–15)
AST: 18 U/L (ref 15–41)
Albumin: 3.3 g/dL — ABNORMAL LOW (ref 3.5–5.0)
BILIRUBIN TOTAL: 0.8 mg/dL (ref 0.3–1.2)
BUN: 9 mg/dL (ref 6–20)
CALCIUM: 8.6 mg/dL — AB (ref 8.9–10.3)
CO2: 26 mmol/L (ref 22–32)
CREATININE: 0.88 mg/dL (ref 0.61–1.24)
Chloride: 106 mmol/L (ref 101–111)
GFR calc Af Amer: >60 mL/min (ref >60)
GFR calc non Af Amer: >60 mL/min (ref >60)
GLUCOSE: 86 mg/dL (ref 65–99)
Potassium: 3.8 mmol/L (ref 3.5–5.1)
SODIUM: 138 mmol/L (ref 135–145)
TOTAL PROTEIN: 7 g/dL (ref 6.5–8.1)

## 2015-09-28 LAB — CBC WITH DIFFERENTIAL/PLATELET
BASOS ABS: 0 10*3/uL (ref 0.0–0.1)
BASOS PCT: 0 %
EOS ABS: 0.2 10*3/uL (ref 0.0–0.7)
EOS PCT: 2 %
HCT: 40.9 % (ref 39.0–52.0)
Hemoglobin: 13 g/dL (ref 13.0–17.0)
LYMPHS PCT: 19 %
Lymphs Abs: 2 10*3/uL (ref 0.7–4.0)
MCH: 27 pg (ref 26.0–34.0)
MCHC: 31.8 g/dL (ref 30.0–36.0)
MCV: 85 fL (ref 78.0–100.0)
MONO ABS: 1.1 10*3/uL — AB (ref 0.1–1.0)
Monocytes Relative: 10 %
Neutro Abs: 7.4 10*3/uL (ref 1.7–7.7)
Neutrophils Relative %: 69 %
Platelets: 241 10*3/uL (ref 150–400)
RBC: 4.81 MIL/uL (ref 4.22–5.81)
RDW: 14.2 % (ref 11.5–15.5)
WBC: 10.7 10*3/uL — AB (ref 4.0–10.5)

## 2015-09-28 MED ORDER — DIPHENHYDRAMINE HCL 25 MG PO CAPS: 50.0000 mg | ORAL_CAPSULE | ORAL | Status: DC

## 2015-09-28 MED ORDER — ACETAMINOPHEN 325 MG PO TABS
650.0000 mg | ORAL_TABLET | ORAL | Status: AC
  Administered 2015-09-28: 650 mg via ORAL

## 2015-09-28 MED ORDER — ACETAMINOPHEN 325 MG PO TABS
ORAL_TABLET | ORAL | Status: AC
  Filled 2015-09-28: qty 2

## 2015-09-28 MED ORDER — SODIUM CHLORIDE 0.9 % IV SOLN
INTRAVENOUS | Status: DC
  Administered 2015-09-28: 11:00:00 via INTRAVENOUS

## 2015-09-28 MED ORDER — SODIUM CHLORIDE 0.9 % IV SOLN
3.0000 mg/kg | INTRAVENOUS | Status: AC
  Administered 2015-09-28: 400 mg via INTRAVENOUS
  Filled 2015-09-28: qty 40

## 2015-09-28 MED ORDER — DIPHENHYDRAMINE HCL 25 MG PO CAPS
ORAL_CAPSULE | ORAL | Status: AC
Start: 2015-09-28 — End: 2015-09-28
  Administered 2015-09-28: 25 mg
  Filled 2015-09-28: qty 1

## 2015-10-31 ENCOUNTER — Other Ambulatory Visit: Payer: Self-pay | Admitting: Hematology and Oncology

## 2015-11-22 ENCOUNTER — Other Ambulatory Visit (HOSPITAL_COMMUNITY): Payer: Self-pay

## 2015-11-23 ENCOUNTER — Encounter (HOSPITAL_COMMUNITY)
Admission: RE | Admit: 2015-11-23 | Discharge: 2015-11-23 | Disposition: A | Discharge status: Home / Self Care | Payer: BLUE CROSS/BLUE SHIELD | Source: Ambulatory Visit | Attending: Rheumatology | Admitting: Rheumatology

## 2015-11-23 DIAGNOSIS — L4050 Arthropathic psoriasis, unspecified: Secondary | ICD-10-CM | POA: Diagnosis present

## 2015-11-23 LAB — CBC WITH DIFFERENTIAL/PLATELET
BASOS ABS: 0 10*3/uL (ref 0.0–0.1)
BASOS PCT: 0 %
EOS ABS: 0.2 10*3/uL (ref 0.0–0.7)
Eosinophils Relative: 2 %
HCT: 37.5 % — ABNORMAL LOW (ref 39.0–52.0)
HEMOGLOBIN: 11.9 g/dL — AB (ref 13.0–17.0)
Lymphocytes Relative: 22 %
Lymphs Abs: 2 10*3/uL (ref 0.7–4.0)
MCH: 26.6 pg (ref 26.0–34.0)
MCHC: 31.7 g/dL (ref 30.0–36.0)
MCV: 83.9 fL (ref 78.0–100.0)
Monocytes Absolute: 0.7 10*3/uL (ref 0.1–1.0)
Monocytes Relative: 8 %
NEUTROS PCT: 68 %
Neutro Abs: 6.5 10*3/uL (ref 1.7–7.7)
Platelets: 222 10*3/uL (ref 150–400)
RBC: 4.47 MIL/uL (ref 4.22–5.81)
RDW: 16.2 % — ABNORMAL HIGH (ref 11.5–15.5)
WBC: 9.4 10*3/uL (ref 4.0–10.5)

## 2015-11-23 LAB — COMPREHENSIVE METABOLIC PANEL
ALBUMIN: 3.3 g/dL — AB (ref 3.5–5.0)
ALK PHOS: 78 U/L (ref 38–126)
ALT: 14 U/L — AB (ref 17–63)
ANION GAP: 7 (ref 5–15)
AST: 24 U/L (ref 15–41)
BUN: 7 mg/dL (ref 6–20)
CALCIUM: 8.3 mg/dL — AB (ref 8.9–10.3)
CO2: 26 mmol/L (ref 22–32)
Chloride: 106 mmol/L (ref 101–111)
Creatinine, Ser: 0.84 mg/dL (ref 0.61–1.24)
GFR calc Af Amer: >60 mL/min (ref >60)
GFR calc non Af Amer: >60 mL/min (ref >60)
GLUCOSE: 65 mg/dL (ref 65–99)
Potassium: 4.1 mmol/L (ref 3.5–5.1)
SODIUM: 139 mmol/L (ref 135–145)
Total Bilirubin: 0.8 mg/dL (ref 0.3–1.2)
Total Protein: 6.8 g/dL (ref 6.5–8.1)

## 2015-11-23 MED ORDER — DIPHENHYDRAMINE HCL 25 MG PO TABS
25.0000 mg | ORAL_TABLET | Freq: Once | ORAL | Status: DC
  Filled 2015-11-23: qty 1

## 2015-11-23 MED ORDER — SODIUM CHLORIDE 0.9 % IV SOLN
5.0000 mg/kg | INTRAVENOUS | Status: DC
  Administered 2015-11-23: 700 mg via INTRAVENOUS
  Filled 2015-11-23: qty 70

## 2015-11-23 MED ORDER — ACETAMINOPHEN 325 MG PO TABS
ORAL_TABLET | ORAL | Status: AC
  Administered 2015-11-23: 650 mg
  Filled 2015-11-23: qty 2

## 2015-11-23 MED ORDER — DIPHENHYDRAMINE HCL 25 MG PO CAPS
ORAL_CAPSULE | ORAL | Status: AC
  Administered 2015-11-23: 25 mg
  Filled 2015-11-23: qty 1

## 2015-11-23 MED ORDER — SODIUM CHLORIDE 0.9 % IV SOLN
INTRAVENOUS | Status: DC
  Administered 2015-11-23: 11:00:00 via INTRAVENOUS

## 2015-11-23 MED ORDER — ACETAMINOPHEN 325 MG PO TABS: 650.0000 mg | ORAL_TABLET | Freq: Once | ORAL | Status: DC

## 2016-01-04 ENCOUNTER — Ambulatory Visit (HOSPITAL_COMMUNITY)
Admission: RE | Admit: 2016-01-04 | Discharge: 2016-01-04 | Disposition: A | Discharge status: Home / Self Care | Payer: BLUE CROSS/BLUE SHIELD | Source: Ambulatory Visit | Attending: Rheumatology | Admitting: Rheumatology

## 2016-01-04 DIAGNOSIS — L4050 Arthropathic psoriasis, unspecified: Secondary | ICD-10-CM | POA: Diagnosis not present

## 2016-01-04 LAB — CBC WITH DIFFERENTIAL/PLATELET
Basophils Absolute: 0 10*3/uL (ref 0.0–0.1)
Basophils Relative: 0 %
EOS PCT: 4 %
Eosinophils Absolute: 0.3 10*3/uL (ref 0.0–0.7)
HEMATOCRIT: 37 % — AB (ref 39.0–52.0)
HEMOGLOBIN: 11.8 g/dL — AB (ref 13.0–17.0)
LYMPHS ABS: 2.4 10*3/uL (ref 0.7–4.0)
LYMPHS PCT: 24 %
MCH: 26.7 pg (ref 26.0–34.0)
MCHC: 31.9 g/dL (ref 30.0–36.0)
MCV: 83.7 fL (ref 78.0–100.0)
Monocytes Absolute: 0.5 10*3/uL (ref 0.1–1.0)
Monocytes Relative: 5 %
NEUTROS ABS: 6.5 10*3/uL (ref 1.7–7.7)
NEUTROS PCT: 67 %
Platelets: 216 10*3/uL (ref 150–400)
RBC: 4.42 MIL/uL (ref 4.22–5.81)
RDW: 16.9 % — ABNORMAL HIGH (ref 11.5–15.5)
WBC: 9.7 10*3/uL (ref 4.0–10.5)

## 2016-01-04 LAB — COMPREHENSIVE METABOLIC PANEL
ALK PHOS: 76 U/L (ref 38–126)
ALT: 17 U/L (ref 17–63)
AST: 20 U/L (ref 15–41)
Albumin: 3.5 g/dL (ref 3.5–5.0)
Anion gap: 9 (ref 5–15)
BUN: 9 mg/dL (ref 6–20)
CO2: 25 mmol/L (ref 22–32)
CREATININE: 0.96 mg/dL (ref 0.61–1.24)
Calcium: 8.6 mg/dL — ABNORMAL LOW (ref 8.9–10.3)
Chloride: 105 mmol/L (ref 101–111)
Glucose, Bld: 88 mg/dL (ref 65–99)
Potassium: 3.8 mmol/L (ref 3.5–5.1)
Sodium: 139 mmol/L (ref 135–145)
Total Bilirubin: 0.6 mg/dL (ref 0.3–1.2)
Total Protein: 7 g/dL (ref 6.5–8.1)

## 2016-01-04 MED ORDER — INFLIXIMAB 100 MG IV SOLR
5.0000 mg/kg | INTRAVENOUS | Status: DC
  Administered 2016-01-04: 700 mg via INTRAVENOUS
  Filled 2016-01-04: qty 70

## 2016-01-04 MED ORDER — ACETAMINOPHEN 325 MG PO TABS
ORAL_TABLET | ORAL | Status: AC
  Filled 2016-01-04: qty 2

## 2016-01-04 MED ORDER — ACETAMINOPHEN 325 MG PO TABS
650.0000 mg | ORAL_TABLET | Freq: Once | ORAL | Status: AC
  Administered 2016-01-04: 650 mg via ORAL

## 2016-01-04 MED ORDER — SODIUM CHLORIDE 0.9 % IV SOLN
INTRAVENOUS | Status: AC
  Administered 2016-01-04: 11:00:00 via INTRAVENOUS

## 2016-01-04 MED ORDER — DIPHENHYDRAMINE HCL 25 MG PO TABS
25.0000 mg | ORAL_TABLET | Freq: Once | ORAL | Status: AC
  Administered 2016-01-04: 25 mg via ORAL
  Filled 2016-01-04: qty 1

## 2016-01-04 MED ORDER — DIPHENHYDRAMINE HCL 25 MG PO CAPS
ORAL_CAPSULE | ORAL | Status: AC
  Filled 2016-01-04: qty 1

## 2016-02-14 ENCOUNTER — Other Ambulatory Visit (HOSPITAL_COMMUNITY): Payer: Self-pay | Admitting: *Deleted

## 2016-02-15 ENCOUNTER — Encounter (HOSPITAL_COMMUNITY)
Admission: RE | Admit: 2016-02-15 | Discharge: 2016-02-15 | Disposition: A | Discharge status: Home / Self Care | Payer: BLUE CROSS/BLUE SHIELD | Source: Ambulatory Visit | Attending: Rheumatology | Admitting: Rheumatology

## 2016-02-15 DIAGNOSIS — L4050 Arthropathic psoriasis, unspecified: Secondary | ICD-10-CM | POA: Insufficient documentation

## 2016-02-15 LAB — CBC WITH DIFFERENTIAL/PLATELET
Basophils Absolute: 0.1 10*3/uL (ref 0.0–0.1)
Basophils Relative: 1 %
EOS ABS: 0.2 10*3/uL (ref 0.0–0.7)
Eosinophils Relative: 3 %
HEMATOCRIT: 36.8 % — AB (ref 39.0–52.0)
HEMOGLOBIN: 11.1 g/dL — AB (ref 13.0–17.0)
Lymphocytes Relative: 36 %
Lymphs Abs: 2.4 10*3/uL (ref 0.7–4.0)
MCH: 24.8 pg — AB (ref 26.0–34.0)
MCHC: 30.2 g/dL (ref 30.0–36.0)
MCV: 82.1 fL (ref 78.0–100.0)
Monocytes Absolute: 0.7 10*3/uL (ref 0.1–1.0)
Monocytes Relative: 11 %
NEUTROS ABS: 3.3 10*3/uL (ref 1.7–7.7)
NEUTROS PCT: 49 %
Platelets: 211 10*3/uL (ref 150–400)
RBC: 4.48 MIL/uL (ref 4.22–5.81)
RDW: 16.7 % — ABNORMAL HIGH (ref 11.5–15.5)
WBC: 6.6 10*3/uL (ref 4.0–10.5)

## 2016-02-15 LAB — COMPREHENSIVE METABOLIC PANEL
ALK PHOS: 77 U/L (ref 38–126)
ALT: 16 U/L — ABNORMAL LOW (ref 17–63)
ANION GAP: 9 (ref 5–15)
AST: 21 U/L (ref 15–41)
Albumin: 3.5 g/dL (ref 3.5–5.0)
BILIRUBIN TOTAL: 0.8 mg/dL (ref 0.3–1.2)
BUN: 9 mg/dL (ref 6–20)
CALCIUM: 8.3 mg/dL — AB (ref 8.9–10.3)
CO2: 24 mmol/L (ref 22–32)
Chloride: 105 mmol/L (ref 101–111)
Creatinine, Ser: 1 mg/dL (ref 0.61–1.24)
Glucose, Bld: 93 mg/dL (ref 65–99)
Potassium: 3.6 mmol/L (ref 3.5–5.1)
SODIUM: 138 mmol/L (ref 135–145)
TOTAL PROTEIN: 6.8 g/dL (ref 6.5–8.1)

## 2016-02-15 MED ORDER — SODIUM CHLORIDE 0.9 % IV SOLN
5.0000 mg/kg | INTRAVENOUS | Status: DC
  Administered 2016-02-15: 700 mg via INTRAVENOUS
  Filled 2016-02-15: qty 70

## 2016-02-15 MED ORDER — DIPHENHYDRAMINE HCL 25 MG PO CAPS: 25.0000 mg | ORAL_CAPSULE | Freq: Once | ORAL | Status: DC

## 2016-02-15 MED ORDER — ACETAMINOPHEN 325 MG PO TABS: 650.0000 mg | ORAL_TABLET | Freq: Once | ORAL | Status: DC

## 2016-02-15 MED ORDER — SODIUM CHLORIDE 0.9 % IV SOLN: INTRAVENOUS | Status: DC

## 2016-02-15 MED ORDER — SODIUM CHLORIDE 0.9 % IV SOLN
INTRAVENOUS | Status: AC
  Administered 2016-02-15: 11:00:00 via INTRAVENOUS

## 2016-02-19 LAB — QUANTIFERON IN TUBE
QFT TB AG MINUS NIL VALUE: 0.01 [IU]/mL
QUANTIFERON MITOGEN VALUE: >10 IU/mL
QUANTIFERON TB AG VALUE: 0.08 IU/mL
QUANTIFERON TB GOLD: NEGATIVE
Quantiferon Nil Value: 0.07 IU/mL

## 2016-02-19 LAB — QUANTIFERON TB GOLD ASSAY (BLOOD)

## 2016-03-27 ENCOUNTER — Other Ambulatory Visit (HOSPITAL_COMMUNITY): Payer: Self-pay | Admitting: *Deleted

## 2016-03-28 ENCOUNTER — Ambulatory Visit (HOSPITAL_COMMUNITY)
Admission: RE | Admit: 2016-03-28 | Discharge: 2016-03-28 | Disposition: A | Discharge status: Home / Self Care | Payer: BLUE CROSS/BLUE SHIELD | Source: Ambulatory Visit | Attending: Rheumatology | Admitting: Rheumatology

## 2016-03-28 DIAGNOSIS — L4050 Arthropathic psoriasis, unspecified: Secondary | ICD-10-CM | POA: Diagnosis not present

## 2016-03-28 LAB — CBC
HCT: 37.1 % — ABNORMAL LOW (ref 39.0–52.0)
Hemoglobin: 11 g/dL — ABNORMAL LOW (ref 13.0–17.0)
MCH: 23.8 pg — AB (ref 26.0–34.0)
MCHC: 29.6 g/dL — AB (ref 30.0–36.0)
MCV: 80.3 fL (ref 78.0–100.0)
PLATELETS: 226 10*3/uL (ref 150–400)
RBC: 4.62 MIL/uL (ref 4.22–5.81)
RDW: 17.2 % — AB (ref 11.5–15.5)
WBC: 7.1 10*3/uL (ref 4.0–10.5)

## 2016-03-28 LAB — DIFFERENTIAL
BASOS ABS: 0 10*3/uL (ref 0.0–0.1)
Basophils Relative: 0 %
EOS ABS: 0.2 10*3/uL (ref 0.0–0.7)
Eosinophils Relative: 3 %
LYMPHS ABS: 2.2 10*3/uL (ref 0.7–4.0)
LYMPHS PCT: 32 %
Monocytes Absolute: 0.6 10*3/uL (ref 0.1–1.0)
Monocytes Relative: 9 %
NEUTROS PCT: 56 %
Neutro Abs: 4 10*3/uL (ref 1.7–7.7)

## 2016-03-28 LAB — COMPREHENSIVE METABOLIC PANEL
ALT: 16 U/L — ABNORMAL LOW (ref 17–63)
AST: 20 U/L (ref 15–41)
Albumin: 3.4 g/dL — ABNORMAL LOW (ref 3.5–5.0)
Alkaline Phosphatase: 87 U/L (ref 38–126)
Anion gap: 10 (ref 5–15)
BUN: 9 mg/dL (ref 6–20)
CHLORIDE: 104 mmol/L (ref 101–111)
CO2: 26 mmol/L (ref 22–32)
CREATININE: 1.08 mg/dL (ref 0.61–1.24)
Calcium: 8.5 mg/dL — ABNORMAL LOW (ref 8.9–10.3)
Glucose, Bld: 135 mg/dL — ABNORMAL HIGH (ref 65–99)
POTASSIUM: 3.5 mmol/L (ref 3.5–5.1)
Sodium: 140 mmol/L (ref 135–145)
Total Bilirubin: 0.8 mg/dL (ref 0.3–1.2)
Total Protein: 6.9 g/dL (ref 6.5–8.1)

## 2016-03-28 MED ORDER — ACETAMINOPHEN 325 MG PO TABS
650.0000 mg | ORAL_TABLET | ORAL | Status: DC
Start: 2016-03-28 — End: 2016-03-29

## 2016-03-28 MED ORDER — DIPHENHYDRAMINE HCL 25 MG PO CAPS: 25.0000 mg | ORAL_CAPSULE | ORAL | Status: DC

## 2016-03-28 MED ORDER — SODIUM CHLORIDE 0.9 % IV SOLN
5.0000 mg/kg | INTRAVENOUS | Status: DC
  Administered 2016-03-28: 600 mg via INTRAVENOUS
  Filled 2016-03-28: qty 60

## 2016-03-28 MED ORDER — SODIUM CHLORIDE 0.9 % IV SOLN
INTRAVENOUS | Status: AC
  Administered 2016-03-28: 11:00:00 via INTRAVENOUS

## 2016-05-08 ENCOUNTER — Other Ambulatory Visit (HOSPITAL_COMMUNITY): Payer: Self-pay | Admitting: *Deleted

## 2016-05-09 ENCOUNTER — Inpatient Hospital Stay (HOSPITAL_COMMUNITY): Admission: RE | Admit: 2016-05-09 | Payer: BLUE CROSS/BLUE SHIELD | Source: Ambulatory Visit

## 2016-06-13 ENCOUNTER — Encounter (HOSPITAL_COMMUNITY): Payer: BLUE CROSS/BLUE SHIELD

## 2016-06-18 ENCOUNTER — Encounter (HOSPITAL_COMMUNITY): Payer: BLUE CROSS/BLUE SHIELD

## 2016-06-27 ENCOUNTER — Encounter (HOSPITAL_COMMUNITY)
Admission: RE | Admit: 2016-06-27 | Discharge: 2016-06-27 | Disposition: A | Discharge status: Home / Self Care | Payer: BLUE CROSS/BLUE SHIELD | Source: Ambulatory Visit | Attending: Rheumatology | Admitting: Rheumatology

## 2016-06-27 DIAGNOSIS — L4050 Arthropathic psoriasis, unspecified: Secondary | ICD-10-CM | POA: Insufficient documentation

## 2016-06-27 LAB — CBC WITH DIFFERENTIAL/PLATELET
BASOS PCT: 0 %
Basophils Absolute: 0 10*3/uL (ref 0.0–0.1)
EOS PCT: 1 %
Eosinophils Absolute: 0.1 10*3/uL (ref 0.0–0.7)
HCT: 33.8 % — ABNORMAL LOW (ref 39.0–52.0)
HEMOGLOBIN: 10 g/dL — AB (ref 13.0–17.0)
LYMPHS PCT: 26 %
Lymphs Abs: 2.8 10*3/uL (ref 0.7–4.0)
MCH: 22 pg — ABNORMAL LOW (ref 26.0–34.0)
MCHC: 29.6 g/dL — AB (ref 30.0–36.0)
MCV: 74.3 fL — ABNORMAL LOW (ref 78.0–100.0)
MONOS PCT: 8 %
Monocytes Absolute: 0.9 10*3/uL (ref 0.1–1.0)
NEUTROS PCT: 65 %
Neutro Abs: 7 10*3/uL (ref 1.7–7.7)
Platelets: 246 10*3/uL (ref 150–400)
RBC: 4.55 MIL/uL (ref 4.22–5.81)
RDW: 18.1 % — AB (ref 11.5–15.5)
WBC: 10.8 10*3/uL — AB (ref 4.0–10.5)

## 2016-06-27 LAB — COMPREHENSIVE METABOLIC PANEL
ALBUMIN: 3.7 g/dL (ref 3.5–5.0)
ALT: 15 U/L — ABNORMAL LOW (ref 17–63)
ANION GAP: 7 (ref 5–15)
AST: 20 U/L (ref 15–41)
Alkaline Phosphatase: 94 U/L (ref 38–126)
BUN: 10 mg/dL (ref 6–20)
CO2: 25 mmol/L (ref 22–32)
Calcium: 8.8 mg/dL — ABNORMAL LOW (ref 8.9–10.3)
Chloride: 105 mmol/L (ref 101–111)
Creatinine, Ser: 1.17 mg/dL (ref 0.61–1.24)
GFR calc Af Amer: >60 mL/min (ref >60)
GFR calc non Af Amer: >60 mL/min (ref >60)
GLUCOSE: 103 mg/dL — AB (ref 65–99)
POTASSIUM: 3.1 mmol/L — AB (ref 3.5–5.1)
SODIUM: 137 mmol/L (ref 135–145)
Total Bilirubin: 0.7 mg/dL (ref 0.3–1.2)
Total Protein: 7.5 g/dL (ref 6.5–8.1)

## 2016-06-27 MED ORDER — SODIUM CHLORIDE 0.9 % IV SOLN
600.0000 mg | INTRAVENOUS | Status: DC
  Administered 2016-06-27: 600 mg via INTRAVENOUS
  Filled 2016-06-27: qty 30

## 2016-06-27 MED ORDER — SODIUM CHLORIDE 0.9 % IV SOLN
INTRAVENOUS | Status: DC
  Administered 2016-06-27: 10:00:00 via INTRAVENOUS

## 2016-06-27 MED ORDER — DIPHENHYDRAMINE HCL 25 MG PO CAPS
25.0000 mg | ORAL_CAPSULE | ORAL | Status: DC
  Administered 2016-06-27: 25 mg via ORAL

## 2016-06-27 MED ORDER — ACETAMINOPHEN 325 MG PO TABS
650.0000 mg | ORAL_TABLET | ORAL | Status: DC
  Administered 2016-06-27: 650 mg via ORAL

## 2016-06-27 MED ORDER — DIPHENHYDRAMINE HCL 25 MG PO CAPS
ORAL_CAPSULE | ORAL | Status: AC
  Filled 2016-06-27: qty 1

## 2016-06-27 MED ORDER — ACETAMINOPHEN 325 MG PO TABS
ORAL_TABLET | ORAL | Status: AC
  Filled 2016-06-27: qty 2

## 2016-07-14 DIAGNOSIS — L4050 Arthropathic psoriasis, unspecified: Secondary | ICD-10-CM

## 2016-07-23 NOTE — Addendum Note (Signed)
Encounter addended by: Fernande BrasSheryl A Braxon Suder, RN on: 07/23/2016  9:10 PM<BR>    Actions taken: Charge Capture section accepted

## 2016-08-08 ENCOUNTER — Encounter (HOSPITAL_COMMUNITY): Payer: BLUE CROSS/BLUE SHIELD

## 2016-08-14 ENCOUNTER — Ambulatory Visit: Payer: 59 | Admitting: Rheumatology

## 2016-08-21 ENCOUNTER — Other Ambulatory Visit (HOSPITAL_COMMUNITY): Payer: Self-pay

## 2016-08-22 ENCOUNTER — Ambulatory Visit (HOSPITAL_COMMUNITY)
Admission: RE | Admit: 2016-08-22 | Discharge: 2016-08-22 | Disposition: A | Discharge status: Home / Self Care | Payer: BLUE CROSS/BLUE SHIELD | Source: Ambulatory Visit | Attending: Rheumatology | Admitting: Rheumatology

## 2016-08-22 DIAGNOSIS — Z882 Allergy status to sulfonamides status: Secondary | ICD-10-CM | POA: Insufficient documentation

## 2016-08-22 DIAGNOSIS — L4050 Arthropathic psoriasis, unspecified: Secondary | ICD-10-CM | POA: Diagnosis present

## 2016-08-22 LAB — CBC WITH DIFFERENTIAL/PLATELET
BASOS PCT: 0 %
Basophils Absolute: 0 10*3/uL (ref 0.0–0.1)
EOS PCT: 2 %
Eosinophils Absolute: 0.2 10*3/uL (ref 0.0–0.7)
HCT: 31.2 % — ABNORMAL LOW (ref 39.0–52.0)
Hemoglobin: 9.1 g/dL — ABNORMAL LOW (ref 13.0–17.0)
LYMPHS ABS: 3 10*3/uL (ref 0.7–4.0)
Lymphocytes Relative: 30 %
MCH: 21.6 pg — AB (ref 26.0–34.0)
MCHC: 29.2 g/dL — ABNORMAL LOW (ref 30.0–36.0)
MCV: 73.9 fL — AB (ref 78.0–100.0)
MONO ABS: 0.9 10*3/uL (ref 0.1–1.0)
Monocytes Relative: 9 %
NEUTROS ABS: 5.9 10*3/uL (ref 1.7–7.7)
Neutrophils Relative %: 59 %
PLATELETS: 219 10*3/uL (ref 150–400)
RBC: 4.22 MIL/uL (ref 4.22–5.81)
RDW: 20.1 % — AB (ref 11.5–15.5)
WBC: 10 10*3/uL (ref 4.0–10.5)

## 2016-08-22 LAB — COMPREHENSIVE METABOLIC PANEL
ALT: 18 U/L (ref 17–63)
AST: 21 U/L (ref 15–41)
Albumin: 3.6 g/dL (ref 3.5–5.0)
Alkaline Phosphatase: 69 U/L (ref 38–126)
Anion gap: 7 (ref 5–15)
BUN: 12 mg/dL (ref 6–20)
CHLORIDE: 104 mmol/L (ref 101–111)
CO2: 27 mmol/L (ref 22–32)
CREATININE: 1.04 mg/dL (ref 0.61–1.24)
Calcium: 8.5 mg/dL — ABNORMAL LOW (ref 8.9–10.3)
Glucose, Bld: 97 mg/dL (ref 65–99)
POTASSIUM: 3.4 mmol/L — AB (ref 3.5–5.1)
Sodium: 138 mmol/L (ref 135–145)
Total Bilirubin: 0.5 mg/dL (ref 0.3–1.2)
Total Protein: 6.5 g/dL (ref 6.5–8.1)

## 2016-08-22 MED ORDER — DIPHENHYDRAMINE HCL 25 MG PO CAPS
ORAL_CAPSULE | ORAL | Status: AC
  Filled 2016-08-22: qty 1

## 2016-08-22 MED ORDER — ACETAMINOPHEN 325 MG PO TABS
ORAL_TABLET | ORAL | Status: AC
  Filled 2016-08-22: qty 2

## 2016-08-22 MED ORDER — SODIUM CHLORIDE 0.9 % IV SOLN
5.0000 mg/kg | INTRAVENOUS | Status: DC
  Administered 2016-08-22: 600 mg via INTRAVENOUS
  Filled 2016-08-22: qty 60

## 2016-08-22 MED ORDER — SODIUM CHLORIDE 0.9 % IV SOLN
Freq: Once | INTRAVENOUS | Status: DC
  Administered 2016-08-22: 11:00:00 via INTRAVENOUS

## 2016-08-22 MED ORDER — DIPHENHYDRAMINE HCL 25 MG PO CAPS
25.0000 mg | ORAL_CAPSULE | Freq: Once | ORAL | Status: DC
  Administered 2016-08-22: 25 mg via ORAL

## 2016-08-22 MED ORDER — ACETAMINOPHEN 325 MG PO TABS
650.0000 mg | ORAL_TABLET | Freq: Once | ORAL | Status: DC
  Administered 2016-08-22: 650 mg via ORAL

## 2016-09-16 ENCOUNTER — Ambulatory Visit: Payer: 59 | Admitting: Rheumatology

## 2016-10-07 ENCOUNTER — Other Ambulatory Visit (HOSPITAL_COMMUNITY): Payer: Self-pay | Admitting: *Deleted

## 2016-10-07 NOTE — Progress Notes (Signed)
Office Visit Note  Patient: Joseph [name]             Date of Birth: 10-05-1981           MRN: [id number]             PCP: Josue HectorNYLAND,[name], MD Referring: Joette CatchingNyland, Leonard, MD Visit Date: 10/08/2016 Occupation: Machinist    Subjective:  Pain in calves   History of Present Illness: Joseph [name] is a 35 y.o. right-handed male with psoriatic arthritis. He states he has not had any joint pain or joint swelling. He continues to have some discomfort in his calves from prolonged standing at work. His psoriasis is quite well-controlled as well. [name] been tolerating his medications well. He continues to be anemic. According to his wife he was seen by Dr. Lysbeth GalasNyland and is a schedule to have iron infusion. He has seen hematologist in the past. Due to financial reasons they could not continue to see the hematologist.  Activities of Daily Living:  Patient reports morning stiffness for 10 minutes.   Patient Denies nocturnal pain.  Difficulty dressing/grooming: Denies Difficulty climbing stairs: Reports Difficulty getting out of chair: Denies Difficulty using hands for taps, buttons, cutlery, and/or writing: Denies   Review of Systems  Constitutional: Positive for fatigue. Negative for night sweats and weakness ( ).  HENT: Negative for mouth sores, mouth dryness and nose dryness.   Eyes: Negative for redness and dryness.  Respiratory: Negative for shortness of breath and difficulty breathing.   Cardiovascular: Negative for chest pain, palpitations, hypertension, irregular heartbeat and swelling in legs/feet.  Gastrointestinal: Negative for constipation and diarrhea.  Endocrine: Negative for increased urination.  Musculoskeletal: Positive for arthralgias, joint pain, myalgias and myalgias. Negative for joint swelling, muscle weakness, morning stiffness and muscle tenderness.  Skin: Negative for color change, rash, hair loss, nodules/bumps, skin tightness, ulcers and sensitivity to  sunlight.  Allergic/Immunologic: Negative for susceptible to infections.  Neurological: Negative for dizziness, fainting, memory loss and night sweats.  Hematological: Negative for swollen glands.  Psychiatric/Behavioral: Negative for depressed mood and sleep disturbance. The patient is not nervous/anxious.     PMFS History:  Patient Active Problem List   Diagnosis Date Noted  . Acute sinus infection 11/07/2014  . Other acute sinusitis 11/07/2014  . Iron deficiency anemia 09/21/2014  . Psoriatic arthritis (HCC) 09/21/2014  . ESSENTIAL HYPERTENSION, MALIGNANT 02/18/2009  . Nonspecific (abnormal) findings on radiological and other examination of body structure 02/16/2009  . CHEST XRAY, ABNORMAL 02/16/2009    Past Medical History:  Diagnosis Date  . Acute sinus infection 11/07/2014  . Arthritis   . Blood transfusion without reported diagnosis    [facility]   . Iron deficiency anemia 09/21/2014    Family History  Problem Relation Age of Onset  . Pulmonary fibrosis Mother    Past Surgical History:  Procedure Laterality Date  . COLONOSCOPY WITH ESOPHAGOGASTRODUODENOSCOPY (EGD)    . HERNIA REPAIR     Social History   Social History Narrative  . No narrative on file     Objective: Vital Signs: BP 139/85 (BP Location: Right Arm, Patient Position: Sitting, Cuff Size: Large)   Pulse 81   Resp 14   Ht 5\' 10"  (1.778 m)   Wt 285 lb (129.3 kg)   BMI 40.89 kg/m    Physical Exam  Constitutional: [name] oriented to person, place, and time. He appears well-developed and well-nourished.  HENT:  Head: Normocephalic and atraumatic.  Eyes: Conjunctivae  and EOM are normal. Pupils are equal, round, and reactive to light.  Neck: Normal range of motion. Neck supple.  Cardiovascular: Normal rate, regular rhythm and normal heart sounds.   Pulmonary/Chest: Effort normal and breath sounds normal.  Abdominal: Soft. Bowel sounds are normal.  Neurological: [name] alert and oriented to  person, place, and time.  Skin: Skin is warm and dry. Capillary refill takes less than 2 seconds.  Psychiatric: He has a normal mood and affect. His behavior is normal.  Nursing note and vitals reviewed.    Musculoskeletal Exam: C-spine, thoracic, lumbar spine good range of motion no SI joint tenderness. Shoulder joints, elbow joints, wrist joints good range of motion. He had no synovitis over MCPs PIPs and DIPs but he did have some contractures in his joints. Hip joints, knee joints, ankle joints are good range of motion with no synovitis.  CDAI Exam: No CDAI exam completed.    Investigation: Findings:  08/22/2016 CMP normal CBC hemoglobin 9.1, April 2017 TB gold was negative    Imaging: No results found.  Speciality Comments: No specialty comments available.    Procedures:  No procedures performed Allergies: Bee venom and Sulfonamide derivatives   Assessment / Plan:     Visit Diagnoses: Psoriatic arthropathy: Seems to be very well controlled with no synovitis on examination he does have some old changes in his joints from previous some damage. Remicade has been working very well for him but they're very much concerned about the cost of the medication. [name] using running start program and would like to see over the last few months how it covers the Remicade. If it is an issue we may have to switch him to some other medication like Cosyntex. He has failed Enbrel in the past.   Psoriasis: He has no active lesions.  High risk medication use - Remicade 5 mg/kg every 6 weeks, methotrexate  10 mg subcutaneously every week, folic acid 1 mg by mouth daily. His labs are monitored with the infusions.  Varicose veins of both lower extremities: Not symptomatic  Iron deficiency anemia: I will advised to follow up with the PCP closely and continue with the iron infusions.   Orders: No orders of the defined types were placed in this encounter.  No orders of the defined types were  placed in this encounter.   Face-to-face time spent with patient was 30 minutes. 50% of time was spent in counseling and coordination of care.  Follow-Up Instructions: Return in about 5 months (around 03/08/2017) for Psoriatic arthritis.   Pollyann SavoyShaili Ector Laurel, MD

## 2016-10-08 ENCOUNTER — Ambulatory Visit (INDEPENDENT_AMBULATORY_CARE_PROVIDER_SITE_OTHER): Payer: BLUE CROSS/BLUE SHIELD | Admitting: Rheumatology

## 2016-10-08 ENCOUNTER — Ambulatory Visit (HOSPITAL_COMMUNITY)
Admission: RE | Admit: 2016-10-08 | Discharge: 2016-10-08 | Disposition: A | Discharge status: Home / Self Care | Payer: BLUE CROSS/BLUE SHIELD | Source: Ambulatory Visit | Attending: Rheumatology | Admitting: Rheumatology

## 2016-10-08 ENCOUNTER — Encounter: Payer: Self-pay | Admitting: Rheumatology

## 2016-10-08 VITALS — BP 139/85 | HR 81 | Resp 14 | Ht 70.0 in | Wt 285.0 lb

## 2016-10-08 DIAGNOSIS — Z79899 Other long term (current) drug therapy: Secondary | ICD-10-CM

## 2016-10-08 DIAGNOSIS — I8393 Asymptomatic varicose veins of bilateral lower extremities: Secondary | ICD-10-CM

## 2016-10-08 DIAGNOSIS — D509 Iron deficiency anemia, unspecified: Secondary | ICD-10-CM | POA: Diagnosis not present

## 2016-10-08 DIAGNOSIS — L4050 Arthropathic psoriasis, unspecified: Secondary | ICD-10-CM | POA: Diagnosis not present

## 2016-10-08 DIAGNOSIS — L409 Psoriasis, unspecified: Secondary | ICD-10-CM

## 2016-10-08 LAB — CBC WITH DIFFERENTIAL/PLATELET
Basophils Absolute: 0.1 10*3/uL (ref 0.0–0.1)
Basophils Relative: 1 %
Eosinophils Absolute: 0.4 10*3/uL (ref 0.0–0.7)
Eosinophils Relative: 6 %
HEMATOCRIT: 34.2 % — AB (ref 39.0–52.0)
Hemoglobin: 10 g/dL — ABNORMAL LOW (ref 13.0–17.0)
LYMPHS ABS: 2.5 10*3/uL (ref 0.7–4.0)
LYMPHS PCT: 32 %
MCH: 22 pg — ABNORMAL LOW (ref 26.0–34.0)
MCHC: 29.2 g/dL — AB (ref 30.0–36.0)
MCV: 75.3 fL — AB (ref 78.0–100.0)
MONO ABS: 0.6 10*3/uL (ref 0.1–1.0)
MONOS PCT: 8 %
NEUTROS ABS: 4.2 10*3/uL (ref 1.7–7.7)
Neutrophils Relative %: 53 %
Platelets: 247 10*3/uL (ref 150–400)
RBC: 4.54 MIL/uL (ref 4.22–5.81)
RDW: 19.9 % — AB (ref 11.5–15.5)
WBC: 7.8 10*3/uL (ref 4.0–10.5)

## 2016-10-08 LAB — COMPREHENSIVE METABOLIC PANEL
ALT: 49 U/L (ref 17–63)
ANION GAP: 7 (ref 5–15)
AST: 40 U/L (ref 15–41)
Albumin: 3.5 g/dL (ref 3.5–5.0)
Alkaline Phosphatase: 77 U/L (ref 38–126)
BILIRUBIN TOTAL: 0.9 mg/dL (ref 0.3–1.2)
BUN: 10 mg/dL (ref 6–20)
CALCIUM: 8.6 mg/dL — AB (ref 8.9–10.3)
CO2: 26 mmol/L (ref 22–32)
Chloride: 105 mmol/L (ref 101–111)
Creatinine, Ser: 0.86 mg/dL (ref 0.61–1.24)
GFR calc Af Amer: >60 mL/min (ref >60)
Glucose, Bld: 99 mg/dL (ref 65–99)
POTASSIUM: 3.5 mmol/L (ref 3.5–5.1)
Sodium: 138 mmol/L (ref 135–145)
TOTAL PROTEIN: 7 g/dL (ref 6.5–8.1)

## 2016-10-08 MED ORDER — SODIUM CHLORIDE 0.9 % IV SOLN
Freq: Once | INTRAVENOUS | Status: AC
  Administered 2016-10-08: 11:00:00 via INTRAVENOUS

## 2016-10-08 MED ORDER — ACETAMINOPHEN 325 MG PO TABS: 650.0000 mg | ORAL_TABLET | Freq: Once | ORAL | Status: DC

## 2016-10-08 MED ORDER — DIPHENHYDRAMINE HCL 25 MG PO CAPS
25.0000 mg | ORAL_CAPSULE | Freq: Once | ORAL | Status: DC
Start: 2016-10-08 — End: 2016-10-09

## 2016-10-08 MED ORDER — SODIUM CHLORIDE 0.9 % IV SOLN
5.0000 mg/kg | INTRAVENOUS | Status: DC
  Administered 2016-10-08: 600 mg via INTRAVENOUS
  Filled 2016-10-08: qty 60

## 2016-10-08 NOTE — Progress Notes (Signed)
Pharmacy Note Subjective: Patient presents today to [facility] to see Dr. Corliss Skainseveshwar.  Patient had concern regarding coverage of Remicade infusions by his insurance.  Patient seen by the pharmacist for discussion of Remicade coverage.    Assessment/Plan:  - Reviewed patient's chart and noted that Remicade infusions were approved by [facility] [place] from 05/08/16 to 05/08/17.  Confirmed with patient that his insurance is still [facility] [place].   - Also noted that patient is enrolled in [employer] Savings Program (previously known as [employer]).  Patient's wife reports they used to get checks from [employer] but now that the program has switched over the [employer] Savings Program she reports they have not been getting checks so she has no way of knowing when infusions are covered.   - Completed phone call to [employer] while patient was in the office.  Spoke to LibertySylvia in the billing/reimbursement department.  She reports that to use the program patient must submit their bill to the savings program.  The claim will be processed within 5-7 business days, and if approved a check will be mailed to the patient or the site provider.  Nettie ElmSylvia states that they have not received any bills from the patient since April 2017.  Patient's wife reports she mailed several bills last month but the savings program has no record.  Confirmed the savings program fax number [phone]) and mailing address [facility]([address], [address], [place], [place] [address]).  Patients wife states she mailed to the correct mailing address but the savings program claims the bills were not received.  Patient's wife will bring the bills by our office at her earliest convenience for submission via fax.   - We confirmed that patient is still enrolled in the [employer] Savings Program and is active in the program.  As long as claims are submitted from the infusion today it should be  covered by the savings program.  Patient reports he wants to continue with infusions at this time.      Lilla Shookachel Grazia Taffe, Pharm.D., BCPS Clinical Pharmacist Pager: [phone] Phone: [phone] 10/08/2016 10:24 AM

## 2016-10-09 ENCOUNTER — Telehealth: Payer: Self-pay | Admitting: Pharmacist

## 2016-10-09 NOTE — Telephone Encounter (Signed)
Received information today from the [employer] [facility] regarding patient's copay assistance.  I was informed that patient is signed up to receive reimbursement through checks, but the most recently mailed checks have returned to the CarePath Program due to incorrect address.  I was also informed that the CarePath Program needs EOBs for the following infusions for 2017: 10/08/16, 08/22/16, 06/27/16, 01/04/16, and 11/23/15.    I called patient's wife to update her on this information.  She states she will call the [facility] Program to verify the address on file and she will also talk to them about changing back to the charge card instead of receiving checks for every infusion.  I informed her of the EOBs needed and she states she will collect those and bring them to the office to be faxed to the [facility] Program.    Lilla Shookachel Henderson, Pharm.D., BCPS Clinical Pharmacist Pager: [phone] Phone: [phone] 10/09/2016 5:15 PM

## 2016-10-22 ENCOUNTER — Telehealth: Payer: Self-pay | Admitting: Rheumatology

## 2016-10-22 NOTE — Telephone Encounter (Signed)
Patient's wife will be dropping off paperwork for [employer] this morning.

## 2016-10-24 NOTE — Telephone Encounter (Signed)
I received the paperwork from patient's wife.  Patient's wife included information from the following infusion dates: 11/23/15, 01/04/16, 02/15/16, 03/28/16, 06/27/16, 08/22/16.  I faxed this information to [employer] [facility].    I called patient's wife to update her.  I noticed that patient's insurance did not pay anything toward the 06/27/16 infusion.  I reviewed his chart and noted that patient's infusions were approved from 05/08/16 to 05/08/17 by [facility] [place] (Reference #: [id number]).  I advised patents wife to call the insurance company to find out why his insurance is not covering the infusion.  Mrs. Alona BeneJoyce also asked about patient's infusion from 04/20/15 not being covered.  I reviewed the chart and noted that patient's infusions were approved from 04/13/15 to 04/12/16 by [facility] [place] (Reference #: [id number]).  I also noticed that [employer] paid toward the infusion on 09/12/15.  I informed Mrs. Alona BeneJoyce of this.  She reports she will call billing about this infusion date.  She denied any other questions or concerns at this time.     Lilla Shookachel Davonne Jarnigan, Pharm.D., BCPS Clinical Pharmacist Pager: [phone] Phone: [phone] 10/24/2016 4:59 PM

## 2016-10-30 ENCOUNTER — Telehealth: Payer: Self-pay | Admitting: Pharmacist

## 2016-10-30 NOTE — Telephone Encounter (Signed)
I received a return call from patient.  I explained to her the outcome of the [employer] [facility] Savings Program.  I informed patient that [employer] will only pay for cost of medication, not administration/other fees.  Advised patient to contact billing department to discuss payment plan for her deductibles/copays.  Also advised her to talk to them about the 06/27/16 infusion to ensure it is ran under the correct insurance (BCBS [place]).  Discussed plan for future.  Patient's wife does not want to change his therapy at this time and is aware that [employer] will only assist in covering the medications.  She voiced understanding and denied any other questions at this time.     Lilla Shookachel Henderson, Pharm.D., BCPS Clinical Pharmacist Pager: [phone] Phone: [phone] 10/30/2016 5:14 PM

## 2016-10-30 NOTE — Telephone Encounter (Signed)
I received a fax from [facility] stating that they were unable to process the rebate request for treatment on 11/23/15, 01/04/16, and 03/28/16 for the following reasons: "The patient's out-of-pocket expense for Remicade indicated on the explanation of benefits for this treatment date of service is less than the required minimum out-of-pocket expense."  Reviewed the explanation of benefits from these days again and noted that patient's remaining balance is due to deductible/copay not related to the medication.  I spoke to [employer] [facility] who reports they only reimburse for cost of medication not other charges.    [employer] did pay toward the infusion on 02/15/16, but they only paid toward the cost of the medication.  [employer] is still processing the request from 08/22/16.    I called patient's wife to update her.  I left a message requesting she call me back.

## 2016-11-13 ENCOUNTER — Telehealth: Payer: Self-pay | Admitting: Pharmacist

## 2016-11-13 NOTE — Telephone Encounter (Signed)
Received fax from [facility] stating patient will be getting rebate for 08/22/16 infusion.  I called patient to update him.  Left message on machine asking him to call me back.

## 2016-11-13 NOTE — Telephone Encounter (Signed)
Called her back.  Advised her that she should be getting a check from [facility] for patient's October infusion.  She confirms they received the check in the mail.  She states they do want to continue Remicade at this time and she has already confirmed that patient is enrolled in [employer] [facility] Savings Program for Remicade for 2018.  She denies any further questions at this time.    Lilla Shookachel Henderson, Pharm.D., BCPS Clinical Pharmacist Pager: [phone] Phone: [phone] 11/13/2016 2:50 PM

## 2016-11-13 NOTE — Telephone Encounter (Signed)
Patient's wife returned Rachel's call. Please call Shanda BumpsJessica.

## 2016-11-19 ENCOUNTER — Encounter (HOSPITAL_COMMUNITY)
Admission: RE | Admit: 2016-11-19 | Discharge: 2016-11-19 | Disposition: A | Discharge status: Home / Self Care | Payer: BLUE CROSS/BLUE SHIELD | Source: Ambulatory Visit | Attending: Rheumatology | Admitting: Rheumatology

## 2016-11-19 DIAGNOSIS — D649 Anemia, unspecified: Secondary | ICD-10-CM | POA: Diagnosis present

## 2016-11-19 LAB — COMPREHENSIVE METABOLIC PANEL
ALBUMIN: 3.6 g/dL (ref 3.5–5.0)
ALK PHOS: 77 U/L (ref 38–126)
ALT: 24 U/L (ref 17–63)
ANION GAP: 9 (ref 5–15)
AST: 34 U/L (ref 15–41)
BUN: 10 mg/dL (ref 6–20)
CALCIUM: 8.9 mg/dL (ref 8.9–10.3)
CO2: 25 mmol/L (ref 22–32)
CREATININE: 1.01 mg/dL (ref 0.61–1.24)
Chloride: 104 mmol/L (ref 101–111)
GFR calc Af Amer: >60 mL/min (ref >60)
GFR calc non Af Amer: >60 mL/min (ref >60)
GLUCOSE: 106 mg/dL — AB (ref 65–99)
Potassium: 3.9 mmol/L (ref 3.5–5.1)
SODIUM: 138 mmol/L (ref 135–145)
Total Bilirubin: 0.5 mg/dL (ref 0.3–1.2)
Total Protein: 7 g/dL (ref 6.5–8.1)

## 2016-11-19 LAB — CBC WITH DIFFERENTIAL/PLATELET
BASOS ABS: 0 10*3/uL (ref 0.0–0.1)
BASOS PCT: 0 %
EOS ABS: 0.3 10*3/uL (ref 0.0–0.7)
Eosinophils Relative: 4 %
HCT: 37.6 % — ABNORMAL LOW (ref 39.0–52.0)
HEMOGLOBIN: 11.2 g/dL — AB (ref 13.0–17.0)
Lymphocytes Relative: 25 %
Lymphs Abs: 2.2 10*3/uL (ref 0.7–4.0)
MCH: 23.5 pg — ABNORMAL LOW (ref 26.0–34.0)
MCHC: 29.8 g/dL — ABNORMAL LOW (ref 30.0–36.0)
MCV: 78.8 fL (ref 78.0–100.0)
Monocytes Absolute: 0.6 10*3/uL (ref 0.1–1.0)
Monocytes Relative: 6 %
NEUTROS PCT: 65 %
Neutro Abs: 5.8 10*3/uL (ref 1.7–7.7)
Platelets: 225 10*3/uL (ref 150–400)
RBC: 4.77 MIL/uL (ref 4.22–5.81)
RDW: 20.2 % — ABNORMAL HIGH (ref 11.5–15.5)
WBC: 8.9 10*3/uL (ref 4.0–10.5)

## 2016-11-19 MED ORDER — DIPHENHYDRAMINE HCL 25 MG PO CAPS
25.0000 mg | ORAL_CAPSULE | Freq: Once | ORAL | Status: AC
  Administered 2016-11-19: 25 mg via ORAL

## 2016-11-19 MED ORDER — SODIUM CHLORIDE 0.9 % IV SOLN
Freq: Once | INTRAVENOUS | Status: AC
  Administered 2016-11-19: 11:00:00 via INTRAVENOUS

## 2016-11-19 MED ORDER — ACETAMINOPHEN 325 MG PO TABS
650.0000 mg | ORAL_TABLET | Freq: Once | ORAL | Status: AC
  Administered 2016-11-19: 650 mg via ORAL

## 2016-11-19 MED ORDER — DIPHENHYDRAMINE HCL 25 MG PO CAPS
ORAL_CAPSULE | ORAL | Status: AC
  Administered 2016-11-19: 25 mg via ORAL
  Filled 2016-11-19: qty 1

## 2016-11-19 MED ORDER — ACETAMINOPHEN 325 MG PO TABS
ORAL_TABLET | ORAL | Status: AC
  Administered 2016-11-19: 650 mg via ORAL
  Filled 2016-11-19: qty 2

## 2016-11-19 MED ORDER — SODIUM CHLORIDE 0.9 % IV SOLN
5.0000 mg/kg | INTRAVENOUS | Status: DC
  Administered 2016-11-19: 600 mg via INTRAVENOUS
  Filled 2016-11-19: qty 60

## 2016-11-19 NOTE — Progress Notes (Signed)
Mild anemia, rest the labs are normal

## 2016-12-05 ENCOUNTER — Other Ambulatory Visit: Payer: Self-pay | Admitting: Radiology

## 2016-12-30 ENCOUNTER — Other Ambulatory Visit (HOSPITAL_COMMUNITY): Payer: Self-pay | Admitting: *Deleted

## 2016-12-31 ENCOUNTER — Encounter (HOSPITAL_COMMUNITY)
Admission: RE | Admit: 2016-12-31 | Discharge: 2016-12-31 | Disposition: A | Discharge status: Home / Self Care | Payer: BLUE CROSS/BLUE SHIELD | Source: Ambulatory Visit | Attending: Rheumatology | Admitting: Rheumatology

## 2016-12-31 DIAGNOSIS — D649 Anemia, unspecified: Secondary | ICD-10-CM | POA: Diagnosis not present

## 2016-12-31 MED ORDER — ACETAMINOPHEN 325 MG PO TABS: 650.0000 mg | ORAL_TABLET | Freq: Once | ORAL | Status: DC

## 2016-12-31 MED ORDER — SODIUM CHLORIDE 0.9 % IV SOLN
Freq: Once | INTRAVENOUS | Status: DC
  Administered 2016-12-31: 09:00:00 via INTRAVENOUS

## 2016-12-31 MED ORDER — DIPHENHYDRAMINE HCL 25 MG PO CAPS: 25.0000 mg | ORAL_CAPSULE | Freq: Once | ORAL | Status: DC

## 2016-12-31 MED ORDER — SODIUM CHLORIDE 0.9 % IV SOLN
5.0000 mg/kg | INTRAVENOUS | Status: DC
  Administered 2016-12-31: 600 mg via INTRAVENOUS
  Filled 2016-12-31: qty 60

## 2017-02-05 ENCOUNTER — Other Ambulatory Visit: Payer: Self-pay | Admitting: Radiology

## 2017-02-11 ENCOUNTER — Encounter (HOSPITAL_COMMUNITY)
Admission: RE | Admit: 2017-02-11 | Discharge: 2017-02-11 | Disposition: A | Discharge status: Home / Self Care | Payer: BLUE CROSS/BLUE SHIELD | Source: Ambulatory Visit | Attending: Rheumatology | Admitting: Rheumatology

## 2017-02-11 DIAGNOSIS — D649 Anemia, unspecified: Secondary | ICD-10-CM | POA: Diagnosis not present

## 2017-02-11 LAB — COMPREHENSIVE METABOLIC PANEL
ALK PHOS: 77 U/L (ref 38–126)
ALT: 32 U/L (ref 17–63)
ANION GAP: 7 (ref 5–15)
AST: 34 U/L (ref 15–41)
Albumin: 3.5 g/dL (ref 3.5–5.0)
BILIRUBIN TOTAL: 0.8 mg/dL (ref 0.3–1.2)
BUN: 13 mg/dL (ref 6–20)
CALCIUM: 8.4 mg/dL — AB (ref 8.9–10.3)
CO2: 25 mmol/L (ref 22–32)
Chloride: 106 mmol/L (ref 101–111)
Creatinine, Ser: 0.93 mg/dL (ref 0.61–1.24)
GFR calc Af Amer: >60 mL/min (ref >60)
GFR calc non Af Amer: >60 mL/min (ref >60)
Glucose, Bld: 100 mg/dL — ABNORMAL HIGH (ref 65–99)
POTASSIUM: 3.6 mmol/L (ref 3.5–5.1)
Sodium: 138 mmol/L (ref 135–145)
Total Protein: 7.2 g/dL (ref 6.5–8.1)

## 2017-02-11 LAB — CBC WITH DIFFERENTIAL/PLATELET
Basophils Absolute: 0 10*3/uL (ref 0.0–0.1)
Basophils Relative: 1 %
Eosinophils Absolute: 0.4 10*3/uL (ref 0.0–0.7)
Eosinophils Relative: 5 %
HEMATOCRIT: 39.5 % (ref 39.0–52.0)
Hemoglobin: 12.2 g/dL — ABNORMAL LOW (ref 13.0–17.0)
LYMPHS ABS: 2.2 10*3/uL (ref 0.7–4.0)
LYMPHS PCT: 28 %
MCH: 25.4 pg — ABNORMAL LOW (ref 26.0–34.0)
MCHC: 30.9 g/dL (ref 30.0–36.0)
MCV: 82.1 fL (ref 78.0–100.0)
MONO ABS: 0.7 10*3/uL (ref 0.1–1.0)
MONOS PCT: 9 %
NEUTROS ABS: 4.6 10*3/uL (ref 1.7–7.7)
Neutrophils Relative %: 57 %
Platelets: 194 10*3/uL (ref 150–400)
RBC: 4.81 MIL/uL (ref 4.22–5.81)
RDW: 17.7 % — AB (ref 11.5–15.5)
WBC: 7.9 10*3/uL (ref 4.0–10.5)

## 2017-02-11 MED ORDER — ACETAMINOPHEN 325 MG PO TABS: 650.0000 mg | ORAL_TABLET | Freq: Once | ORAL | Status: DC

## 2017-02-11 MED ORDER — DIPHENHYDRAMINE HCL 25 MG PO CAPS: 25.0000 mg | ORAL_CAPSULE | Freq: Once | ORAL | Status: DC

## 2017-02-11 MED ORDER — SODIUM CHLORIDE 0.9 % IV SOLN
5.0000 mg/kg | INTRAVENOUS | Status: DC
  Administered 2017-02-11: 700 mg via INTRAVENOUS
  Filled 2017-02-11: qty 70

## 2017-02-11 MED ORDER — SODIUM CHLORIDE 0.9 % IV SOLN
Freq: Once | INTRAVENOUS | Status: AC
  Administered 2017-02-11: 09:00:00 via INTRAVENOUS

## 2017-02-11 NOTE — Progress Notes (Signed)
Labs are stable.

## 2017-03-10 ENCOUNTER — Ambulatory Visit: Payer: BLUE CROSS/BLUE SHIELD | Admitting: Rheumatology

## 2017-03-11 ENCOUNTER — Ambulatory Visit: Payer: BLUE CROSS/BLUE SHIELD | Admitting: Rheumatology

## 2017-03-13 ENCOUNTER — Other Ambulatory Visit: Payer: Self-pay | Admitting: Radiology

## 2017-03-13 DIAGNOSIS — L4050 Arthropathic psoriasis, unspecified: Secondary | ICD-10-CM

## 2017-03-13 NOTE — Progress Notes (Signed)
Infusion orders updated today including CBC CMP Tylenol and Benadryl appointments are up to date and follow up appointment is scheduled TB gold is due now, it is updated as well

## 2017-03-25 ENCOUNTER — Encounter (HOSPITAL_COMMUNITY): Payer: BLUE CROSS/BLUE SHIELD

## 2017-03-26 ENCOUNTER — Encounter (HOSPITAL_COMMUNITY): Payer: BLUE CROSS/BLUE SHIELD

## 2017-04-02 ENCOUNTER — Other Ambulatory Visit (HOSPITAL_COMMUNITY): Payer: Self-pay | Admitting: *Deleted

## 2017-04-03 ENCOUNTER — Encounter (HOSPITAL_COMMUNITY)
Admission: RE | Admit: 2017-04-03 | Discharge: 2017-04-03 | Disposition: A | Discharge status: Home / Self Care | Payer: BLUE CROSS/BLUE SHIELD | Source: Ambulatory Visit | Attending: Rheumatology | Admitting: Rheumatology

## 2017-04-03 DIAGNOSIS — L4050 Arthropathic psoriasis, unspecified: Secondary | ICD-10-CM

## 2017-04-03 DIAGNOSIS — D649 Anemia, unspecified: Secondary | ICD-10-CM | POA: Insufficient documentation

## 2017-04-03 LAB — COMPREHENSIVE METABOLIC PANEL
ALBUMIN: 3.8 g/dL (ref 3.5–5.0)
ALK PHOS: 83 U/L (ref 38–126)
ALT: 19 U/L (ref 17–63)
AST: 26 U/L (ref 15–41)
Anion gap: 7 (ref 5–15)
BILIRUBIN TOTAL: 0.6 mg/dL (ref 0.3–1.2)
BUN: 11 mg/dL (ref 6–20)
CALCIUM: 8.2 mg/dL — AB (ref 8.9–10.3)
CO2: 26 mmol/L (ref 22–32)
CREATININE: 1.02 mg/dL (ref 0.61–1.24)
Chloride: 103 mmol/L (ref 101–111)
GFR calc Af Amer: >60 mL/min (ref >60)
GFR calc non Af Amer: >60 mL/min (ref >60)
GLUCOSE: 98 mg/dL (ref 65–99)
Potassium: 3 mmol/L — ABNORMAL LOW (ref 3.5–5.1)
SODIUM: 136 mmol/L (ref 135–145)
TOTAL PROTEIN: 7.3 g/dL (ref 6.5–8.1)

## 2017-04-03 LAB — CBC WITH DIFFERENTIAL/PLATELET
BASOS ABS: 0 10*3/uL (ref 0.0–0.1)
BASOS PCT: 0 %
EOS ABS: 0.1 10*3/uL (ref 0.0–0.7)
Eosinophils Relative: 1 %
HEMATOCRIT: 36.4 % — AB (ref 39.0–52.0)
HEMOGLOBIN: 11 g/dL — AB (ref 13.0–17.0)
Lymphocytes Relative: 33 %
Lymphs Abs: 3.5 10*3/uL (ref 0.7–4.0)
MCH: 24.2 pg — ABNORMAL LOW (ref 26.0–34.0)
MCHC: 30.2 g/dL (ref 30.0–36.0)
MCV: 80.2 fL (ref 78.0–100.0)
MONOS PCT: 7 %
Monocytes Absolute: 0.8 10*3/uL (ref 0.1–1.0)
NEUTROS ABS: 6.1 10*3/uL (ref 1.7–7.7)
Neutrophils Relative %: 59 %
Platelets: 205 10*3/uL (ref 150–400)
RBC: 4.54 MIL/uL (ref 4.22–5.81)
RDW: 17.7 % — ABNORMAL HIGH (ref 11.5–15.5)
WBC: 10.5 10*3/uL (ref 4.0–10.5)

## 2017-04-03 MED ORDER — SODIUM CHLORIDE 0.9 % IV SOLN
Freq: Once | INTRAVENOUS | Status: AC
  Administered 2017-04-03: 11:00:00 via INTRAVENOUS

## 2017-04-03 MED ORDER — SODIUM CHLORIDE 0.9 % IV SOLN
5.0000 mg/kg | INTRAVENOUS | Status: DC
  Administered 2017-04-03: 700 mg via INTRAVENOUS
  Filled 2017-04-03: qty 70

## 2017-04-03 MED ORDER — ACETAMINOPHEN 325 MG PO TABS: 650.0000 mg | ORAL_TABLET | ORAL | Status: DC

## 2017-04-03 MED ORDER — DIPHENHYDRAMINE HCL 25 MG PO CAPS: 25.0000 mg | ORAL_CAPSULE | ORAL | Status: DC

## 2017-04-03 NOTE — Progress Notes (Signed)
Potassium is low. Please notify patient and faxed labs to the PCP

## 2017-04-07 ENCOUNTER — Telehealth: Payer: Self-pay | Admitting: Pharmacist

## 2017-04-07 DIAGNOSIS — L4050 Arthropathic psoriasis, unspecified: Secondary | ICD-10-CM

## 2017-04-07 LAB — QUANTIFERON IN TUBE
QFT TB AG MINUS NIL VALUE: 0.07 IU/mL
QUANTIFERON MITOGEN VALUE: 8.79 IU/mL
QUANTIFERON TB AG VALUE: 0.12 IU/mL
QUANTIFERON TB GOLD: NEGATIVE
Quantiferon Nil Value: 0.05 IU/mL

## 2017-04-07 LAB — QUANTIFERON TB GOLD ASSAY (BLOOD)

## 2017-04-07 NOTE — Telephone Encounter (Signed)
Patient had Remicade infusion on 04/03/17.  Next infusion scheduled for 05/15/17.  There are new site of care guidelines for [employer]BS [place] going into effect at the end of June that will prevent patient from being able to get his infusions at [facility].  I called patient to discuss these.  I left a message on his voicemail asking him to call me back.   Lilla Shookachel Henderson, Pharm.D., BCPS, CPP Clinical Pharmacist Pager: [phone] Phone: [phone] 04/07/2017 3:10 PM

## 2017-04-08 NOTE — Telephone Encounter (Signed)
Referral placed.

## 2017-04-08 NOTE — Telephone Encounter (Signed)
yes

## 2017-04-08 NOTE — Telephone Encounter (Signed)
I received a call from patient's wife.  We discussed Joseph [name]'s infusions and the new site of care guidelines through [facility] [place] that will no longer allow him to get infusions through the hospital outpatient infusion center.    Discussed infusing at [facility].  Patient is agreeable.  Advised he will need to sign a release form to release records to [facility].  Patient asked that I mail him the release form and he will fax it back.    Okay to place referral to [facility] for patient's Remicade infusions?   Lilla Shookachel Khyrin Trevathan, Pharm.D., BCPS, CPP Clinical Pharmacist Pager: [phone] Phone: [phone] 04/08/2017 8:24 AM

## 2017-04-08 NOTE — Telephone Encounter (Signed)
Can you please refer this patient to [facility] for Remicade infusions?  Thank you!

## 2017-04-17 ENCOUNTER — Ambulatory Visit: Payer: BLUE CROSS/BLUE SHIELD | Admitting: Rheumatology

## 2017-04-18 ENCOUNTER — Other Ambulatory Visit: Payer: Self-pay | Admitting: Rheumatology

## 2017-04-20 NOTE — Telephone Encounter (Signed)
Last Visit: 10/08/17 Next Visit: 05/29/17 Labs: 04/03/17 Low potassium, patient advised and pcp received copy of labs  Okay to refill MTX?

## 2017-04-24 ENCOUNTER — Telehealth: Payer: Self-pay | Admitting: Pharmacist

## 2017-04-24 NOTE — Telephone Encounter (Signed)
Follow up on transition of infusions from [facility] to [facility].  Patient has been referred to [facility] and has appointment on 05/21/17.  I also mailed patient a release form to release records to [facility] in order to get him set up for infusions at their site.  I have not received form back from patient.  I called to follow up and left a message asking patient to call me back.   Lilla Shookachel Adaora Mchaney, Pharm.D., BCPS, CPP Clinical Pharmacist Pager: [phone] Phone: [phone] 04/24/2017 9:44 AM

## 2017-04-24 NOTE — Telephone Encounter (Signed)
Received a return call from patient's wife, Joseph [name].  She confirms patient is scheduled for initial visit at [facility] to establish care.  She has the release of information form and plans to mail it to our office next week.  I advised we should cancel the infusion at the hospital on 05/15/17 since this will not be covered by insurance.  Infusion was cancelled.  I will look for the release of information form and submit information to [facility] once form is received.    Joseph Shookachel Male [name], Pharm.D., BCPS, CPP Clinical Pharmacist Pager: [phone] Phone: [phone] 04/24/2017 2:53 PM

## 2017-05-01 ENCOUNTER — Telehealth: Payer: Self-pay | Admitting: Pharmacist

## 2017-05-01 NOTE — Telephone Encounter (Signed)
Received release of information form that was signed by patient authorizing release of information to [facility] to establish infusions at their infusion center.  I called patient to update him that I received the letter.  I sent the information to [employer].    Lilla Shookachel Davidmichael Zarazua, Pharm.D., BCPS, CPP Clinical Pharmacist Pager: [phone] Phone: [phone] 05/01/2017 4:26 PM

## 2017-05-15 ENCOUNTER — Encounter (HOSPITAL_COMMUNITY): Payer: BLUE CROSS/BLUE SHIELD

## 2017-05-21 ENCOUNTER — Ambulatory Visit (INDEPENDENT_AMBULATORY_CARE_PROVIDER_SITE_OTHER): Payer: BLUE CROSS/BLUE SHIELD | Admitting: Neurology

## 2017-05-21 ENCOUNTER — Encounter: Payer: Self-pay | Admitting: Neurology

## 2017-05-21 VITALS — BP 125/83 | HR 76 | Ht 70.0 in | Wt 291.5 lb

## 2017-05-21 DIAGNOSIS — L4050 Arthropathic psoriasis, unspecified: Secondary | ICD-10-CM

## 2017-05-21 DIAGNOSIS — Z5181 Encounter for therapeutic drug level monitoring: Secondary | ICD-10-CM

## 2017-05-21 NOTE — Progress Notes (Signed)
Reason for visit: Psoriatic arthritis  Referring physician: Dr. Leone Payor is a 36 y.o. male  History of present illness:  [name] is a 36 year old right-handed white male with a history of psoriasis and psoriatic arthritis. The patient has been followed by Dr. Corliss Skains, the patient has been getting Remicade injections which have been very helpful for his arthritis and have significantly reduced the pain in his hands and feet associated with this disease. The patient has minimal manifestations of psoriatic skin lesions at this time. The patient denies any significant neck or low back pain, he denies headaches. He has not had any numbness of the extremities or problems with balance or difficulty controlling the bowels or the bladder. He had been getting infusions of Remicade through the hospital, but his insurance will no longer cover this, he comes here to establish with this office to get the Remicade injections on an outpatient basis.  Past Medical History:  Diagnosis Date  . Acute sinus infection 11/07/2014  . Arthritis   . Blood transfusion without reported diagnosis    At [facility]   . Iron deficiency anemia 09/21/2014    Past Surgical History:  Procedure Laterality Date  . COLONOSCOPY WITH ESOPHAGOGASTRODUODENOSCOPY (EGD)    . HERNIA REPAIR  1994  . 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 Pulmonary fibrosis Mother     Social history:  reports that he has never smoked. He has never used smokeless tobacco. He reports that he does not drink alcohol or use drugs.  Medications:  Prior to Admission medications   Medication Sig Start Date End Date Taking? Authorizing Provider  clobetasol cream (TEMOVATE) 0.05 % Apply topically 2 (two) times daily as needed. 09/08/14  Yes [provider]  folic acid (FOLVITE) 1 MG tablet Take 2 mg by mouth daily. 09/08/14   Yes [provider]  InFLIXimab (REMICADE IV) Inject 5 mg/kg into the vein. 5mg / kg infuse 600mg  /6 vials over 2 hours every 6 weeks   Yes Deveshwar, Janalyn Rouse, MD  methotrexate 50 MG/2ML injection INJECT 0.4ML SUB Q WEEKLY 04/20/17  Yes Panwala, Naitik, PA-C      Allergies  Allergen Reactions  . Bee Venom   . Sulfonamide Derivatives Other (See Comments)    UNSPECIFIED    ROS:  Out of a complete 14 system review of symptoms, the patient complains only of the following symptoms, and all other reviewed systems are negative.  Arthritis  Blood pressure 125/83, pulse 76, height 5\' 10"  (1.778 m), weight 291 lb 8 oz (132.2 kg).  Physical Exam  General: The patient is alert and cooperative at the time of the examination. The patient is markedly obese.  Eyes: Pupils are equal, round, and reactive to light. Discs are flat bilaterally.  Neck: The neck is supple, no carotid bruits are noted.  Respiratory: The respiratory examination is clear.  Cardiovascular: The cardiovascular examination reveals a regular rate and rhythm, no obvious murmurs or rubs are noted.  Skin: Extremities are without significant edema.  Neurologic Exam  Mental status: The patient is alert and oriented x 3 at the time of the examination. The patient has apparent normal recent and remote memory, with an apparently normal attention span and concentration ability.  Cranial nerves: Facial symmetry is present. There is good sensation of the face to pinprick and soft touch bilaterally.  The strength of the facial muscles and the muscles to head turning and shoulder shrug are normal bilaterally. Speech is well enunciated, no aphasia or dysarthria is noted. Extraocular movements are full. Visual fields are full. The tongue is midline, and the patient has symmetric elevation of the soft palate. No obvious hearing deficits are noted.  Motor: The motor testing reveals 5 over 5 strength of all 4 extremities. Good  symmetric motor tone is noted throughout.  Sensory: Sensory testing is intact to pinprick, soft touch and vibration sensation on all 4 extremities. No evidence of extinction is noted.  Coordination: Cerebellar testing reveals good finger-nose-finger and heel-to-shin bilaterally.  Gait and station: Gait is normal. Tandem gait is normal. Romberg is negative. No drift is seen.  Reflexes: Deep tendon reflexes are symmetric and normal bilaterally. Toes are downgoing bilaterally.   Assessment/Plan:  1. Psoriatic arthritis  The patient will be getting Remicade injections through this office. If desired, we can obtain blood work when the patient comes in for infusions. I will defer to Dr. Corliss Skainseveshwar in this regard. Otherwise, the patient will follow-up with me on an as-needed basis.  [name]. Keith Kaislyn Gulas MD 05/21/2017 1:56 PM  [facility] [address] [place], [place] [id number]  Phone [phone] Fax [phone]

## 2017-05-27 ENCOUNTER — Telehealth: Payer: Self-pay | Admitting: Pharmacist

## 2017-05-27 NOTE — Telephone Encounter (Signed)
I attempted to call patient to follow up on transition of infusions from [facility] to [facility].  [facility] was seen at [facility] on [facility].  I called patient to identify whether first infusion was scheduled.  Left message asking patient to call me back.   Lilla Shookachel Henderson, Pharm.D., BCPS, CPP Clinical Pharmacist Pager: [phone] Phone: [phone] 05/27/2017 12:17 PM

## 2017-05-28 NOTE — Telephone Encounter (Signed)
I received an incoming call from patient's wife.  She reports she has not heard anything regarding scheduling patient's infusion yet.  I reached out to [place]ondra with [employer] who reports the prior authorization is still pending.  I will update patient once I know more information.   Lilla Shookachel Henderson, Pharm.D., BCPS, CPP Clinical Pharmacist Pager: [phone] Phone: [phone] 05/28/2017 8:38 AM

## 2017-05-29 ENCOUNTER — Ambulatory Visit: Payer: BLUE CROSS/BLUE SHIELD | Admitting: Rheumatology

## 2017-06-01 NOTE — Telephone Encounter (Signed)
Received notification from [place]ondra at [employer], "His Berkley Harveyauth was just received, will review financial assistance today and he will be approved and scheduled ASAP!"    I called patient to update him on this information.  Left a message asking him to call me back.    Lilla Shookachel Sahory Nordling, Pharm.D., BCPS, CPP Clinical Pharmacist Pager: [phone] Phone: [phone] 06/01/2017 9:12 AM

## 2017-06-16 ENCOUNTER — Telehealth: Payer: Self-pay | Admitting: Pharmacist

## 2017-06-16 DIAGNOSIS — Z79899 Other long term (current) drug therapy: Secondary | ICD-10-CM

## 2017-06-16 NOTE — Telephone Encounter (Signed)
I will let [facility] know about informing lumen the patient comes in for an infusion. I will order the blood work at that time.

## 2017-06-16 NOTE — Telephone Encounter (Signed)
I called patient to follow up on whether he had his first infusion at [facility].  Patient's wife answered the phone and confirmed that patient had his infusion on Friday, 06/12/17.  Noted there were no labs drawn with his infusion.  Will coordinate with [facility] to ensure labs are drawn with all of patient's infusions.  Patient is due for standing labs.  [place] in [place] and [facility] [place].  Orders were entered and faxed to [facility].  Shanda BumpsJessica confirms patient will have labs drawn this week.  Lilla Shookachel Markis Langland, Pharm.D., BCPS, CPP Clinical Pharmacist Pager: [phone] Phone: [phone] 06/16/2017 8:36 AM

## 2017-06-16 NOTE — Telephone Encounter (Signed)
Please, obtain CBC and CMP with [facility] prior to each infusion. He will need a TB Gold once a year. Please, forward labs to us.Thanks

## 2017-07-10 ENCOUNTER — Telehealth: Payer: Self-pay

## 2017-07-10 NOTE — Telephone Encounter (Signed)
Received a fax from [employer] [facility] stating that they were unable to process pt rebate for a treatment on 04/03/17 due to incomplete or unauthorized submitted documents. Please call [phone].   Left message for patient to call back.    Will send document to scan center  [place], Mayer [place]hasta, CPhT 8:25 AM

## 2017-07-10 NOTE — Telephone Encounter (Signed)
Patients wife returned call. She states that she received a letter as well. She has submitted the correct information in a couple of days ago to [employer] [facility].   Darian Ace, [place], CPhT 8:33 AM

## 2017-07-17 ENCOUNTER — Other Ambulatory Visit: Payer: Self-pay | Admitting: Neurology

## 2017-07-17 ENCOUNTER — Other Ambulatory Visit (INDEPENDENT_AMBULATORY_CARE_PROVIDER_SITE_OTHER): Payer: Self-pay

## 2017-07-17 DIAGNOSIS — Z0289 Encounter for other administrative examinations: Secondary | ICD-10-CM

## 2017-07-17 DIAGNOSIS — Z5181 Encounter for therapeutic drug level monitoring: Secondary | ICD-10-CM

## 2017-07-18 LAB — CBC WITH DIFFERENTIAL/PLATELET
BASOS: 0 %
Basophils Absolute: 0 10*3/uL (ref 0.0–0.2)
EOS (ABSOLUTE): 0.3 10*3/uL (ref 0.0–0.4)
Eos: 5 %
Hematocrit: 34.7 % — ABNORMAL LOW (ref 37.5–51.0)
Hemoglobin: 10.5 g/dL — ABNORMAL LOW (ref 13.0–17.7)
IMMATURE GRANULOCYTES: 0 %
Immature Grans (Abs): 0 10*3/uL (ref 0.0–0.1)
Lymphocytes Absolute: 2.2 10*3/uL (ref 0.7–3.1)
Lymphs: 31 %
MCH: 23.3 pg — AB (ref 26.6–33.0)
MCHC: 30.3 g/dL — ABNORMAL LOW (ref 31.5–35.7)
MCV: 77 fL — AB (ref 79–97)
MONOS ABS: 0.9 10*3/uL (ref 0.1–0.9)
Monocytes: 12 %
Neutrophils Absolute: 3.8 10*3/uL (ref 1.4–7.0)
Neutrophils: 52 %
Platelets: 195 10*3/uL (ref 150–379)
RBC: 4.5 x10E6/uL (ref 4.14–5.80)
RDW: 18.7 % — AB (ref 12.3–15.4)
WBC: 7.3 10*3/uL (ref 3.4–10.8)

## 2017-07-18 LAB — COMPREHENSIVE METABOLIC PANEL
A/G RATIO: 1.3 (ref 1.2–2.2)
ALK PHOS: 94 IU/L (ref 39–117)
ALT: 17 IU/L (ref 0–44)
AST: 18 IU/L (ref 0–40)
Albumin: 4 g/dL (ref 3.5–5.5)
BILIRUBIN TOTAL: 0.5 mg/dL (ref 0.0–1.2)
BUN/Creatinine Ratio: 11 (ref 9–20)
BUN: 11 mg/dL (ref 6–20)
CHLORIDE: 101 mmol/L (ref 96–106)
CO2: 24 mmol/L (ref 20–29)
Calcium: 8.5 mg/dL — ABNORMAL LOW (ref 8.7–10.2)
Creatinine, Ser: 1.04 mg/dL (ref 0.76–1.27)
GFR calc Af Amer: 108 mL/min/{1.73_m2} (ref >59)
GFR calc non Af Amer: 93 mL/min/{1.73_m2} (ref >59)
GLOBULIN, TOTAL: 3.1 g/dL (ref 1.5–4.5)
Glucose: 92 mg/dL (ref 65–99)
POTASSIUM: 3.6 mmol/L (ref 3.5–5.2)
SODIUM: 140 mmol/L (ref 134–144)
Total Protein: 7.1 g/dL (ref 6.0–8.5)

## 2017-07-18 NOTE — Progress Notes (Signed)
Please advise MVI with iron. Calcium rich diet

## 2017-07-20 NOTE — Progress Notes (Signed)
Office Visit Note  Patient: Joseph [name]             Date of Birth: 09/24/1981           MRN: [id number]             PCP: Dione Housekeeper, MD Referring: Dione Housekeeper, MD Visit Date: 07/28/2017 Occupation: _0 @    Subjective:  Joint pain.   History of Present Illness: Joseph [name] is a 36 y.o. male with history of psoriatic arthritis and psoriasis. He states his been having increased joint pain and stiffness. He reports swelling in his left ankle and pain in his bilateral feet. No discomfort in his hands. He states she's been getting Remicade every 6 weeks and taking methotrexate 0.4 mL subcutaneous every week.  Activities of Daily Living:  Patient reports morning stiffness for 15 minutes.   Patient Reports nocturnal pain.  Difficulty dressing/grooming: Denies Difficulty climbing stairs: Denies Difficulty getting out of chair: Denies Difficulty using hands for taps, buttons, cutlery, and/or writing: Denies   Review of Systems  Constitutional: Negative for fatigue, night sweats and weakness ( ).  HENT: Negative for mouth sores, mouth dryness and nose dryness.   Eyes: Negative for redness and dryness.  Respiratory: Negative for cough, shortness of breath and difficulty breathing.   Cardiovascular: Negative for chest pain, palpitations, hypertension, irregular heartbeat and swelling in legs/feet.  Gastrointestinal: Negative for blood in stool, constipation and diarrhea.  Endocrine: Negative for increased urination.  Genitourinary: Negative for hematuria and urgency.  Musculoskeletal: Positive for arthralgias, joint pain and morning stiffness. Negative for joint swelling, myalgias, muscle weakness, muscle tenderness and myalgias.  Skin: Negative for color change, rash, hair loss, nodules/bumps, skin tightness, ulcers and sensitivity to sunlight.  Allergic/Immunologic: Negative for susceptible to infections.  Neurological: Negative for dizziness, fainting,  light-headedness, numbness, memory loss and night sweats.  Hematological: Negative for swollen glands.  Psychiatric/Behavioral: Negative for depressed mood and sleep disturbance. The patient is not nervous/anxious.     PMFS History:  Patient Active Problem List   Diagnosis Date Noted  . Varicose veins of both lower extremities 07/26/2017  . Acute sinus infection 11/07/2014  . Other acute sinusitis 11/07/2014  . Iron deficiency anemia 09/21/2014  . Psoriatic arthritis ([place]) 09/21/2014  . ESSENTIAL HYPERTENSION, MALIGNANT 02/18/2009  . Nonspecific (abnormal) findings on radiological and other examination of body structure 02/16/2009  . CHEST XRAY, ABNORMAL 02/16/2009    Past Medical History:  Diagnosis Date  . Acute sinus infection 11/07/2014  . Arthritis   . Blood transfusion without reported diagnosis    At [facility]   . Iron deficiency anemia 09/21/2014    Family History  Problem Relation Age of Onset  . Pulmonary fibrosis Mother    Past Surgical History:  Procedure Laterality Date  . COLONOSCOPY WITH ESOPHAGOGASTRODUODENOSCOPY (EGD)    . HERNIA REPAIR  1994  . 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 He appears well-developed and well-nourished.  HENT:  Head: Normocephalic and atraumatic.  Eyes: Pupils are  equal, round, and reactive to light. Conjunctivae and EOM are normal.  Neck: Normal range of motion. Neck supple.  Cardiovascular: Normal rate, regular rhythm and normal heart sounds.   Pulmonary/Chest: Effort normal and breath sounds normal.  Abdominal: Soft. Bowel sounds are normal.  Musculoskeletal: He exhibits edema.  Over  bilateral lower extremities  Neurological: [name] alert and oriented to person, place, and time.  Skin: Skin is warm and dry. Capillary refill takes less than 2 seconds. Rash noted.  She psoriasis patches noted on dorsum of his feet.  Psychiatric: He has a normal mood and affect. His behavior is normal.  Nursing note and vitals reviewed.    Musculoskeletal Exam: C-spine and thoracic lumbar spine good range of motion. Shoulder joints elbow joints wrist joints with good range of motion. He has some changes in his hands from prior arthritis but no active synovitis or dactylitis was noted. Hip joints knee joints with good range of motion. He has some edema over his bilateral lower extremities which is causing ankle joint discomfort. But no warmth or swelling was noted. He had no active synovitis over his bilateral MTPs or PIPs.  CDAI Exam: CDAI Homunculus Exam:   Joint Counts:  CDAI Tender Joint count: 0 CDAI Swollen Joint count: 0  Global Assessments:  Patient Global Assessment: 5   CDAI Calculated Score: 5    Investigation: Findings:  04/03/2017 normal TB gold   Patient has not infused since 04/03/2017   CBC Latest Ref Rng & Units 07/17/2017 04/03/2017 02/11/2017  WBC 3.4 - 10.8 x10E3/uL 7.3 10.5 7.9  Hemoglobin 13.0 - 17.7 g/dL 10.5(L) 11.0(L) 12.2(L)  Hematocrit 37.5 - 51.0 % 34.7(L) 36.4(L) 39.5  Platelets 150 - 379 x10E3/uL 195 205 194   CMP Latest Ref Rng & Units 07/17/2017 04/03/2017 02/11/2017  Glucose 65 - 99 mg/dL 92 98 100(H)  BUN 6 - 20 mg/dL _0 Creatinine 0.76 - 1.27 mg/dL 1.04 1.02 0.93  Sodium 134 - 144 mmol/L 140 136 138  Potassium 3.5 - 5.2 mmol/L 3.6 3.0(L) 3.6  Chloride 96 - 106 mmol/L 101 103 106  CO2 20 - 29 mmol/L _1 Calcium 8.7 - 10.2 mg/dL 8.5(L) 8.2(L) 8.4(L)  Total Protein 6.0 - 8.5 g/dL 7.1 7.3 7.2  Total Bilirubin 0.0 - 1.2 mg/dL 0.5 0.6 0.8  Alkaline Phos 39 - 117 IU/L 94 83 77  AST 0 - 40 IU/L 18 26 34  ALT 0 - 44 IU/L 17 19 32     Imaging: Xr Foot 2 Views Left  Result Date: 07/28/2017 All MTP joint narrowing subluxation and was noted. Erosive changes were noted in second third fourth and fifth MTP joints and PIP joints. Ankylosis of second PIP joint was noted. No significant intertarsal joint space narrowing was noted. Impression: Erosive inflammatory arthritis. There was no interval change from films of December 2016.  Xr Foot 2 Views Right  Result Date: 07/28/2017 All MTP joint narrowing was noted. Erosive changes were noted in second third fourth and fifth MTP joints. PIP joint and DIP joint narrowing was noted. Erosive changes were noted in first PIP joint. No intertarsal joint space narrowing was noted. A small calcaneal spur was noted. Impression: These findings are consistent with inflammatory erosive arthritis. There was no interval change from films of December 2016.  Xr Hand 2 View Left  Result Date: 07/28/2017 Juxta articular osteopenia noted all PIP narrowing was noted. Severe fifth PIP narrowing was noted. No significant intercarpal radiocarpal  joint space narrowing was noted. No erosive changes were noted. These findings were consistent with inflammatory arthritis.  Xr Hand 2 View Right  Result Date: 07/28/2017 Juxta articular osteopenia was noted. Right first second and third MCP narrowing was noted. All PIP joints were narrow. Right second MCP joint erosive changes were noted. No intercarpal radiocarpal joint space narrowing was noted. Impression: These findings are consistent with inflammatory arthritis. There was no interval change from films of December 2016.   Speciality Comments: No specialty comments available.    Procedures:  No procedures performed Allergies: Bee venom and Sulfonamide derivatives   Assessment / Plan:     Visit Diagnoses: Psoriatic arthritis ([place]): He complains of increase arthralgias especially in his feet in the morning. I do not see any synovitis today. [name] on  low-dose methotrexate. We discussed increasing methotrexate 0.62m subcutaneous every week. We will see how he responds to that. He will continue Remicade at current dose.  Psoriasis: He has few scattered lesions on his feet. Advised him to use topical agents for right now.  High risk medication use - Remicade 5 mg/kg IV every 6 weeks, Methotrexate 0.4 mL subcutaneous every week, folic acid 1 mg by mouth daily  Pain in both hands - Plan: XR Hand 2 View Right, XR Hand 2 View Left  Pain in both feet - Plan: XR Foot 2 Views Right, XR Foot 2 Views Left    Iron deficiency anemia  Primary osteoarthritis of both hands, dactylitis fingers from PsA in the past. He has no active dactylitis today.  History of hypertension: His blood pressure is a still elevated. He's been advised to monitor his blood pressure and follow up with her PCP.  Varicose veins of both lower extremities and pedal edema on bilateral lower extremities.  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 Weight loss diet and exercise was discussed.  Orders: Orders Placed This Encounter  Procedures  . XR Hand 2 View Right  . XR Hand 2 View Left  . XR Foot 2 Views Right  . XR Foot 2 Views Left   Meds ordered this encounter  Medications  . methotrexate 50 MG/2ML injection    Sig: Inject 0.6 mL Sub q once a week    Dispense:  10 mL    Refill:  0    Please dispense 2 mL vial with preservatives.  . Tuberculin-Allergy Syringes 27G X 1/2" 1 ML KIT    Sig: To use with injectable methotrexate once a week    Dispense:  12 each    Refill:  3  . folic acid (FOLVITE) 1 MG tablet    Sig: Take 1 tablet (1 mg total) by mouth daily.    Dispense:  90 tablet    Refill:  3      Follow-Up Instructions: Return in about 4 months (around 11/27/2017) for Psoriatic arthritis psoriasis.   SBo Merino MD  Note - This record has been created using DEditor, commissioning  Chart creation errors have been sought, but may not always  have been located. Such  creation errors do not reflect on  the standard of medical care.

## 2017-07-26 DIAGNOSIS — I8393 Asymptomatic varicose veins of bilateral lower extremities: Secondary | ICD-10-CM | POA: Insufficient documentation

## 2017-07-28 ENCOUNTER — Ambulatory Visit (INDEPENDENT_AMBULATORY_CARE_PROVIDER_SITE_OTHER): Payer: BLUE CROSS/BLUE SHIELD

## 2017-07-28 ENCOUNTER — Ambulatory Visit (INDEPENDENT_AMBULATORY_CARE_PROVIDER_SITE_OTHER): Payer: BLUE CROSS/BLUE SHIELD | Admitting: Rheumatology

## 2017-07-28 ENCOUNTER — Ambulatory Visit (INDEPENDENT_AMBULATORY_CARE_PROVIDER_SITE_OTHER): Payer: Self-pay

## 2017-07-28 ENCOUNTER — Encounter: Payer: Self-pay | Admitting: Rheumatology

## 2017-07-28 VITALS — BP 140/89 | HR 64 | Resp 14 | Ht 70.0 in | Wt 295.0 lb

## 2017-07-28 DIAGNOSIS — D509 Iron deficiency anemia, unspecified: Secondary | ICD-10-CM

## 2017-07-28 DIAGNOSIS — M79672 Pain in left foot: Secondary | ICD-10-CM

## 2017-07-28 DIAGNOSIS — M79671 Pain in right foot: Secondary | ICD-10-CM

## 2017-07-28 DIAGNOSIS — M79642 Pain in left hand: Secondary | ICD-10-CM

## 2017-07-28 DIAGNOSIS — L4050 Arthropathic psoriasis, unspecified: Secondary | ICD-10-CM | POA: Diagnosis not present

## 2017-07-28 DIAGNOSIS — I8393 Asymptomatic varicose veins of bilateral lower extremities: Secondary | ICD-10-CM | POA: Diagnosis not present

## 2017-07-28 DIAGNOSIS — M79641 Pain in right hand: Secondary | ICD-10-CM | POA: Diagnosis not present

## 2017-07-28 DIAGNOSIS — M19042 Primary osteoarthritis, left hand: Secondary | ICD-10-CM | POA: Diagnosis not present

## 2017-07-28 DIAGNOSIS — Z79899 Other long term (current) drug therapy: Secondary | ICD-10-CM

## 2017-07-28 DIAGNOSIS — Z8679 Personal history of other diseases of the circulatory system: Secondary | ICD-10-CM

## 2017-07-28 DIAGNOSIS — L409 Psoriasis, unspecified: Secondary | ICD-10-CM | POA: Diagnosis not present

## 2017-07-28 DIAGNOSIS — M19041 Primary osteoarthritis, right hand: Secondary | ICD-10-CM | POA: Diagnosis not present

## 2017-07-28 DIAGNOSIS — Z6841 Body Mass Index (BMI) 40.0 and over, adult: Secondary | ICD-10-CM

## 2017-07-28 MED ORDER — FOLIC ACID 1 MG PO TABS: 1.0000 mg | ORAL_TABLET | Freq: Every day | ORAL | 3 refills | Status: DC

## 2017-07-28 MED ORDER — "TUBERCULIN-ALLERGY SYRINGES 27G X 1/2"" 1 ML KIT": PACK | 3 refills | Status: DC

## 2017-07-28 MED ORDER — METHOTREXATE SODIUM CHEMO INJECTION 50 MG/2ML: INTRAMUSCULAR | 0 refills | Status: DC

## 2017-08-28 ENCOUNTER — Other Ambulatory Visit: Payer: Self-pay | Admitting: Neurology

## 2017-08-28 ENCOUNTER — Other Ambulatory Visit (INDEPENDENT_AMBULATORY_CARE_PROVIDER_SITE_OTHER): Payer: Self-pay

## 2017-08-28 DIAGNOSIS — Z5181 Encounter for therapeutic drug level monitoring: Secondary | ICD-10-CM

## 2017-08-28 DIAGNOSIS — Z79899 Other long term (current) drug therapy: Secondary | ICD-10-CM

## 2017-08-28 DIAGNOSIS — Z0289 Encounter for other administrative examinations: Secondary | ICD-10-CM

## 2017-08-29 LAB — COMPREHENSIVE METABOLIC PANEL
A/G RATIO: 1.3 (ref 1.2–2.2)
ALK PHOS: 91 IU/L (ref 39–117)
ALT: 18 IU/L (ref 0–44)
AST: 18 IU/L (ref 0–40)
Albumin: 4 g/dL (ref 3.5–5.5)
BILIRUBIN TOTAL: 0.5 mg/dL (ref 0.0–1.2)
BUN/Creatinine Ratio: 10 (ref 9–20)
BUN: 11 mg/dL (ref 6–20)
CHLORIDE: 100 mmol/L (ref 96–106)
CO2: 19 mmol/L — ABNORMAL LOW (ref 20–29)
Calcium: 8.7 mg/dL (ref 8.7–10.2)
Creatinine, Ser: 1.09 mg/dL (ref 0.76–1.27)
GFR calc Af Amer: 102 mL/min/{1.73_m2} (ref >59)
GFR calc non Af Amer: 88 mL/min/{1.73_m2} (ref >59)
GLOBULIN, TOTAL: 3.1 g/dL (ref 1.5–4.5)
Glucose: 94 mg/dL (ref 65–99)
POTASSIUM: 3.6 mmol/L (ref 3.5–5.2)
SODIUM: 138 mmol/L (ref 134–144)
Total Protein: 7.1 g/dL (ref 6.0–8.5)

## 2017-08-29 LAB — CMP14+EGFR
A/G RATIO: 1.3 (ref 1.2–2.2)
ALT: 18 IU/L (ref 0–44)
AST: 16 IU/L (ref 0–40)
Albumin: 4 g/dL (ref 3.5–5.5)
Alkaline Phosphatase: 95 IU/L (ref 39–117)
BUN/Creatinine Ratio: 13 (ref 9–20)
BUN: 13 mg/dL (ref 6–20)
Bilirubin Total: 0.5 mg/dL (ref 0.0–1.2)
CALCIUM: 8.7 mg/dL (ref 8.7–10.2)
CHLORIDE: 100 mmol/L (ref 96–106)
CO2: 20 mmol/L (ref 20–29)
Creatinine, Ser: 1.04 mg/dL (ref 0.76–1.27)
GFR calc Af Amer: 108 mL/min/{1.73_m2} (ref >59)
GFR, EST NON AFRICAN AMERICAN: 93 mL/min/{1.73_m2} (ref >59)
GLOBULIN, TOTAL: 3 g/dL (ref 1.5–4.5)
Glucose: 99 mg/dL (ref 65–99)
Potassium: 3.4 mmol/L — ABNORMAL LOW (ref 3.5–5.2)
SODIUM: 138 mmol/L (ref 134–144)
Total Protein: 7 g/dL (ref 6.0–8.5)

## 2017-08-29 LAB — CBC WITH DIFFERENTIAL/PLATELET
BASOS: 0 %
Basophils Absolute: 0 10*3/uL (ref 0.0–0.2)
EOS (ABSOLUTE): 0.2 10*3/uL (ref 0.0–0.4)
EOS: 3 %
HEMATOCRIT: 34.5 % — AB (ref 37.5–51.0)
Hemoglobin: 10.8 g/dL — ABNORMAL LOW (ref 13.0–17.7)
IMMATURE GRANULOCYTES: 0 %
Immature Grans (Abs): 0 10*3/uL (ref 0.0–0.1)
LYMPHS ABS: 3 10*3/uL (ref 0.7–3.1)
LYMPHS: 36 %
MCH: 24 pg — AB (ref 26.6–33.0)
MCHC: 31.3 g/dL — AB (ref 31.5–35.7)
MCV: 77 fL — AB (ref 79–97)
MONOS ABS: 0.6 10*3/uL (ref 0.1–0.9)
Monocytes: 8 %
NEUTROS ABS: 4.4 10*3/uL (ref 1.4–7.0)
NEUTROS PCT: 53 %
PLATELETS: 203 10*3/uL (ref 150–379)
RBC: 4.5 x10E6/uL (ref 4.14–5.80)
RDW: 18 % — AB (ref 12.3–15.4)
WBC: 8.3 10*3/uL (ref 3.4–10.8)

## 2017-08-29 NOTE — Progress Notes (Signed)
K is low. Rest of the labs are normal

## 2017-08-30 NOTE — Progress Notes (Signed)
Labs are stable.

## 2017-11-27 ENCOUNTER — Other Ambulatory Visit: Payer: Self-pay | Admitting: Neurology

## 2017-11-27 DIAGNOSIS — Z5181 Encounter for therapeutic drug level monitoring: Secondary | ICD-10-CM

## 2017-11-27 NOTE — Addendum Note (Signed)
Addended by: Tamera StandsHINNANT, Emeril Stille D on: 11/27/2017 09:44 AM   Modules accepted: Orders

## 2017-11-28 LAB — CBC WITH DIFFERENTIAL/PLATELET
BASOS ABS: 0 10*3/uL (ref 0.0–0.2)
Basos: 0 %
EOS (ABSOLUTE): 0.1 10*3/uL (ref 0.0–0.4)
EOS: 1 %
HEMATOCRIT: 36.7 % — AB (ref 37.5–51.0)
HEMOGLOBIN: 11.5 g/dL — AB (ref 13.0–17.7)
IMMATURE GRANS (ABS): 0 10*3/uL (ref 0.0–0.1)
IMMATURE GRANULOCYTES: 0 %
LYMPHS: 31 %
Lymphocytes Absolute: 2.6 10*3/uL (ref 0.7–3.1)
MCH: 23.8 pg — ABNORMAL LOW (ref 26.6–33.0)
MCHC: 31.3 g/dL — ABNORMAL LOW (ref 31.5–35.7)
MCV: 76 fL — ABNORMAL LOW (ref 79–97)
MONOCYTES: 6 %
Monocytes Absolute: 0.5 10*3/uL (ref 0.1–0.9)
NEUTROS PCT: 62 %
Neutrophils Absolute: 5.1 10*3/uL (ref 1.4–7.0)
Platelets: 246 10*3/uL (ref 150–379)
RBC: 4.83 x10E6/uL (ref 4.14–5.80)
RDW: 18.9 % — ABNORMAL HIGH (ref 12.3–15.4)
WBC: 8.3 10*3/uL (ref 3.4–10.8)

## 2017-11-28 LAB — COMPREHENSIVE METABOLIC PANEL
ALK PHOS: 94 IU/L (ref 39–117)
ALT: 27 IU/L (ref 0–44)
AST: 24 IU/L (ref 0–40)
Albumin/Globulin Ratio: 1.2 (ref 1.2–2.2)
Albumin: 4.2 g/dL (ref 3.5–5.5)
BUN/Creatinine Ratio: 12 (ref 9–20)
BUN: 12 mg/dL (ref 6–20)
Bilirubin Total: 0.4 mg/dL (ref 0.0–1.2)
CALCIUM: 8.6 mg/dL — AB (ref 8.7–10.2)
CO2: 21 mmol/L (ref 20–29)
CREATININE: 0.98 mg/dL (ref 0.76–1.27)
Chloride: 99 mmol/L (ref 96–106)
GFR calc Af Amer: 116 mL/min/{1.73_m2} (ref >59)
GFR, EST NON AFRICAN AMERICAN: 100 mL/min/{1.73_m2} (ref >59)
GLOBULIN, TOTAL: 3.4 g/dL (ref 1.5–4.5)
GLUCOSE: 165 mg/dL — AB (ref 65–99)
Potassium: 3.5 mmol/L (ref 3.5–5.2)
SODIUM: 136 mmol/L (ref 134–144)
Total Protein: 7.6 g/dL (ref 6.0–8.5)

## 2017-12-16 ENCOUNTER — Ambulatory Visit: Payer: BLUE CROSS/BLUE SHIELD | Admitting: Rheumatology

## 2017-12-31 DIAGNOSIS — L659 Nonscarring hair loss, unspecified: Secondary | ICD-10-CM | POA: Insufficient documentation

## 2018-01-08 NOTE — Addendum Note (Signed)
Addended by: Tamera StandsHINNANT, Elizeth Weinrich D on: 01/08/2018 08:12 AM   Modules accepted: Orders

## 2018-01-09 LAB — COMPREHENSIVE METABOLIC PANEL
A/G RATIO: 1.3 (ref 1.2–2.2)
ALBUMIN: 4.2 g/dL (ref 3.5–5.5)
ALK PHOS: 96 IU/L (ref 39–117)
ALT: 46 IU/L — ABNORMAL HIGH (ref 0–44)
AST: 44 IU/L — ABNORMAL HIGH (ref 0–40)
BUN / CREAT RATIO: 11 (ref 9–20)
BUN: 12 mg/dL (ref 6–20)
Bilirubin Total: 0.5 mg/dL (ref 0.0–1.2)
CHLORIDE: 101 mmol/L (ref 96–106)
CO2: 21 mmol/L (ref 20–29)
Calcium: 8.3 mg/dL — ABNORMAL LOW (ref 8.7–10.2)
Creatinine, Ser: 1.05 mg/dL (ref 0.76–1.27)
GFR calc Af Amer: 107 mL/min/{1.73_m2} (ref >59)
GFR calc non Af Amer: 92 mL/min/{1.73_m2} (ref >59)
GLOBULIN, TOTAL: 3.2 g/dL (ref 1.5–4.5)
GLUCOSE: 100 mg/dL — AB (ref 65–99)
POTASSIUM: 3.7 mmol/L (ref 3.5–5.2)
SODIUM: 139 mmol/L (ref 134–144)
Total Protein: 7.4 g/dL (ref 6.0–8.5)

## 2018-01-09 LAB — CBC WITH DIFFERENTIAL/PLATELET
BASOS ABS: 0 10*3/uL (ref 0.0–0.2)
Basos: 0 %
EOS (ABSOLUTE): 0.2 10*3/uL (ref 0.0–0.4)
Eos: 2 %
HEMOGLOBIN: 11.4 g/dL — AB (ref 13.0–17.7)
Hematocrit: 36.9 % — ABNORMAL LOW (ref 37.5–51.0)
Immature Grans (Abs): 0 10*3/uL (ref 0.0–0.1)
Immature Granulocytes: 0 %
Lymphocytes Absolute: 2.9 10*3/uL (ref 0.7–3.1)
Lymphs: 30 %
MCH: 24.1 pg — AB (ref 26.6–33.0)
MCHC: 30.9 g/dL — ABNORMAL LOW (ref 31.5–35.7)
MCV: 78 fL — ABNORMAL LOW (ref 79–97)
MONOCYTES: 6 %
Monocytes Absolute: 0.6 10*3/uL (ref 0.1–0.9)
Neutrophils Absolute: 5.8 10*3/uL (ref 1.4–7.0)
Neutrophils: 62 %
Platelets: 242 10*3/uL (ref 150–379)
RBC: 4.73 x10E6/uL (ref 4.14–5.80)
RDW: 19.1 % — ABNORMAL HIGH (ref 12.3–15.4)
WBC: 9.5 10*3/uL (ref 3.4–10.8)

## 2018-01-11 NOTE — Progress Notes (Signed)
LFTs are mildly elevated.  Please advise patient to avoid all NSAIDs and alcohol.  He canceled his appointment in February.  Please schedule appointment.  Repeat LFTs in 1 month.

## 2018-01-18 NOTE — Progress Notes (Deleted)
Office Visit Note  Patient: Joseph [name]             Date of Birth: 1981/11/10           MRN: [id number]             PCP: Joette Catching, MD Referring: Joette Catching, MD Visit Date: 02/01/2018 Occupation: @GUAROCC @    Subjective:  No chief complaint on file.   History of Present Illness: Joseph [name] is a 37 y.o. male ***   Activities of Daily Living:  Patient reports morning stiffness for *** {minute/hour:19697}.   Patient {ACTIONS;DENIES/REPORTS:21021675::"Denies"} nocturnal pain.  Difficulty dressing/grooming: {ACTIONS;DENIES/REPORTS:21021675::"Denies"} Difficulty climbing stairs: {ACTIONS;DENIES/REPORTS:21021675::"Denies"} Difficulty getting out of chair: {ACTIONS;DENIES/REPORTS:21021675::"Denies"} Difficulty using hands for taps, buttons, cutlery, and/or writing: {ACTIONS;DENIES/REPORTS:21021675::"Denies"}   No Rheumatology ROS completed.   PMFS History:  Patient Active Problem List   Diagnosis Date Noted  . Varicose veins of both lower extremities 07/26/2017  . Acute sinus infection 11/07/2014  . Other acute sinusitis 11/07/2014  . Iron deficiency anemia 09/21/2014  . Psoriatic arthritis ([facility]) 09/21/2014  . ESSENTIAL HYPERTENSION, MALIGNANT 02/18/2009  . Nonspecific (abnormal) findings on radiological and other examination of body structure 02/16/2009  . CHEST XRAY, ABNORMAL 02/16/2009    Past Medical History:  Diagnosis Date  . Acute sinus infection 11/07/2014  . Arthritis   . Blood transfusion without reported diagnosis    At [facility]   . Iron deficiency anemia 09/21/2014    Family History  Problem Relation Age of Onset  . Pulmonary fibrosis Mother    Past Surgical History:  Procedure Laterality Date  . COLONOSCOPY WITH ESOPHAGOGASTRODUODENOSCOPY (EGD)    . HERNIA REPAIR  1994  . 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   Physical Exam   Musculoskeletal Exam: ***  CDAI Exam: No CDAI exam completed.    Investigation: No additional findings. CBC Latest Ref Rng & Units 01/08/2018 11/27/2017 08/28/2017  WBC 3.4 - 10.8 x10E3/uL 9.5 8.3 8.3  Hemoglobin 13.0 - 17.7 g/dL 11.4(L) 11.5(L) 10.8(L)  Hematocrit 37.5 - 51.0 % 36.9(L) 36.7(L) 34.5(L)  Platelets 150 - 379 x10E3/uL 242 246 203   CMP Latest Ref Rng & Units 01/08/2018 11/27/2017 08/28/2017  Glucose 65 - 99 mg/dL 191(Y) 782(N) 99  BUN 6 - 20 mg/dL 12 12 13   Creatinine 0.76 - 1.27 mg/dL 5.62 1.30 8.65  Sodium 134 - 144 mmol/L 139 136 138  Potassium 3.5 - 5.2 mmol/L 3.7 3.5 3.4(L)  Chloride 96 - 106 mmol/L 101 99 100  CO2 20 - 29 mmol/L 21 21 20   Calcium 8.7 - 10.2 mg/dL 8.3(L) 8.6(L) 8.7  Total Protein 6.0 - 8.5 g/dL 7.4 7.6 7.0  Total Bilirubin 0.0 - 1.2 mg/dL 0.5 0.4 0.5  Alkaline Phos 39 - 117 IU/L 96 94 95  AST 0 - 40 IU/L 44(H) 24 16  ALT 0 - 44 IU/L 46(H) 27 18    Imaging: No results found.  Speciality Comments: Remicade 5mg /kg every 6 weeks TB gold negative 5/25/18Infuses at [facility]    Procedures:  No procedures performed Allergies: Bee venom and Sulfonamide derivatives   Assessment / Plan:     Visit Diagnoses: Psoriatic arthritis ([facility])  Psoriasis  High risk medication use - Remicade  5 mg/kg IV every 6 weeks, MTX 0.4 ml every week, and folic acid 1 mg daily TB gold: 5/25/18CBC/CMP: 01/08/18  History of iron deficiency anemia  Primary osteoarthritis of both hands  Essential hypertension  Varicose veins of both lower extremities, unspecified whether complicated    Orders: No orders of the defined types were placed in this encounter.  No orders of the defined types were placed in this encounter.   Face-to-face time spent with patient was *** minutes. 50% of time was spent in counseling and coordination of care.  Follow-Up Instructions: No  Follow-up on file.   Gearldine Bienenstockaylor M Jalise Zawistowski, PA-C  Note - This record has been created using Dragon software.  Chart creation errors have been sought, but may not always  have been located. Such creation errors do not reflect on  the standard of medical care.

## 2018-02-01 ENCOUNTER — Ambulatory Visit: Payer: Self-pay | Admitting: Physician Assistant

## 2018-02-02 NOTE — Progress Notes (Deleted)
Office Visit Note  Patient: Joseph [name]             Date of Birth: 1981-10-20           MRN: [id number]             PCP: Joette Catching, MD Referring: Joette Catching, MD Visit Date: 02/04/2018 Occupation: @GUAROCC @    Subjective:  No chief complaint on file.   History of Present Illness: Joseph [name] is a 37 y.o. male ***   Activities of Daily Living:  Patient reports morning stiffness for *** {minute/hour:19697}.   Patient {ACTIONS;DENIES/REPORTS:21021675::"Denies"} nocturnal pain.  Difficulty dressing/grooming: {ACTIONS;DENIES/REPORTS:21021675::"Denies"} Difficulty climbing stairs: {ACTIONS;DENIES/REPORTS:21021675::"Denies"} Difficulty getting out of chair: {ACTIONS;DENIES/REPORTS:21021675::"Denies"} Difficulty using hands for taps, buttons, cutlery, and/or writing: {ACTIONS;DENIES/REPORTS:21021675::"Denies"}   No Rheumatology ROS completed.   PMFS History:  Patient Active Problem List   Diagnosis Date Noted  . Varicose veins of both lower extremities 07/26/2017  . Acute sinus infection 11/07/2014  . Other acute sinusitis 11/07/2014  . Iron deficiency anemia 09/21/2014  . Psoriatic arthritis ([facility]) 09/21/2014  . ESSENTIAL HYPERTENSION, MALIGNANT 02/18/2009  . Nonspecific (abnormal) findings on radiological and other examination of body structure 02/16/2009  . CHEST XRAY, ABNORMAL 02/16/2009    Past Medical History:  Diagnosis Date  . Acute sinus infection 11/07/2014  . Arthritis   . Blood transfusion without reported diagnosis    At [facility]   . Iron deficiency anemia 09/21/2014    Family History  Problem Relation Age of Onset  . Pulmonary fibrosis Mother    Past Surgical History:  Procedure Laterality Date  . COLONOSCOPY WITH ESOPHAGOGASTRODUODENOSCOPY (EGD)    . HERNIA REPAIR  1994  . 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   Physical Exam   Musculoskeletal Exam: ***  CDAI Exam: No CDAI exam completed.    Investigation: No additional findings. CBC Latest Ref Rng & Units 01/08/2018 11/27/2017 08/28/2017  WBC 3.4 - 10.8 x10E3/uL 9.5 8.3 8.3  Hemoglobin 13.0 - 17.7 g/dL 11.4(L) 11.5(L) 10.8(L)  Hematocrit 37.5 - 51.0 % 36.9(L) 36.7(L) 34.5(L)  Platelets 150 - 379 x10E3/uL 242 246 203   CMP Latest Ref Rng & Units 01/08/2018 11/27/2017 08/28/2017  Glucose 65 - 99 mg/dL 409(W) 119(J) 99  BUN 6 - 20 mg/dL 12 12 13   Creatinine 0.76 - 1.27 mg/dL 4.78 2.95 6.21  Sodium 134 - 144 mmol/L 139 136 138  Potassium 3.5 - 5.2 mmol/L 3.7 3.5 3.4(L)  Chloride 96 - 106 mmol/L 101 99 100  CO2 20 - 29 mmol/L 21 21 20   Calcium 8.7 - 10.2 mg/dL 8.3(L) 8.6(L) 8.7  Total Protein 6.0 - 8.5 g/dL 7.4 7.6 7.0  Total Bilirubin 0.0 - 1.2 mg/dL 0.5 0.4 0.5  Alkaline Phos 39 - 117 IU/L 96 94 95  AST 0 - 40 IU/L 44(H) 24 16  ALT 0 - 44 IU/L 46(H) 27 18    Imaging: No results found.  Speciality Comments: Remicade 5mg /kg every 6 weeks TB gold negative 5/25/18Infuses at [facility]    Procedures:  No procedures performed Allergies: Bee venom and Sulfonamide derivatives   Assessment / Plan:     Visit Diagnoses: Psoriatic arthritis (H[facility])  Psoriasis  High risk medication use - Remicade  5 mg/kg IV every 6 weeks, MTX 0.6 ml weekly, and folic acid 1 mg daily CBC and CMP: 3/1/19TB gold: 03/2017  History of iron deficiency anemia  Primary osteoarthritis of both hands  History of hypertension  Varicose veins of both lower extremities, unspecified whether complicated    Orders: No orders of the defined types were placed in this encounter.  No orders of the defined types were placed in this encounter.   Face-to-face time spent with patient was *** minutes. 50% of time was spent in counseling and coordination of care.  Follow-Up Instructions: No  follow-ups on file.   Gearldine Bienenstockaylor M Salvador Bigbee, PA-C  Note - This record has been created using Dragon software.  Chart creation errors have been sought, but may not always  have been located. Such creation errors do not reflect on  the standard of medical care.

## 2018-02-04 ENCOUNTER — Ambulatory Visit: Payer: Self-pay | Admitting: Physician Assistant

## 2018-02-11 ENCOUNTER — Ambulatory Visit: Payer: BLUE CROSS/BLUE SHIELD | Admitting: Physician Assistant

## 2018-02-11 ENCOUNTER — Ambulatory Visit: Payer: Self-pay | Admitting: Physician Assistant

## 2018-02-11 ENCOUNTER — Encounter: Payer: Self-pay | Admitting: Physician Assistant

## 2018-02-11 VITALS — BP 140/95 | HR 93 | Resp 18 | Ht 70.0 in | Wt 308.0 lb

## 2018-02-11 DIAGNOSIS — M19042 Primary osteoarthritis, left hand: Secondary | ICD-10-CM | POA: Diagnosis not present

## 2018-02-11 DIAGNOSIS — L4050 Arthropathic psoriasis, unspecified: Secondary | ICD-10-CM | POA: Diagnosis not present

## 2018-02-11 DIAGNOSIS — Z862 Personal history of diseases of the blood and blood-forming organs and certain disorders involving the immune mechanism: Secondary | ICD-10-CM | POA: Diagnosis not present

## 2018-02-11 DIAGNOSIS — Z8679 Personal history of other diseases of the circulatory system: Secondary | ICD-10-CM | POA: Diagnosis not present

## 2018-02-11 DIAGNOSIS — L409 Psoriasis, unspecified: Secondary | ICD-10-CM | POA: Diagnosis not present

## 2018-02-11 DIAGNOSIS — M67911 Unspecified disorder of synovium and tendon, right shoulder: Secondary | ICD-10-CM | POA: Diagnosis not present

## 2018-02-11 DIAGNOSIS — Z79899 Other long term (current) drug therapy: Secondary | ICD-10-CM | POA: Diagnosis not present

## 2018-02-11 DIAGNOSIS — M19041 Primary osteoarthritis, right hand: Secondary | ICD-10-CM

## 2018-02-11 MED ORDER — CLOBETASOL PROPIONATE 0.05 % EX CREA: TOPICAL_CREAM | Freq: Two times a day (BID) | CUTANEOUS | 1 refills | Status: DC | PRN

## 2018-02-11 NOTE — Patient Instructions (Signed)
Shoulder Exercises Ask your health care provider which exercises are safe for you. Do exercises exactly as told by your health care provider and adjust them as directed. It is normal to feel mild stretching, pulling, tightness, or discomfort as you do these exercises, but you should stop right away if you feel sudden pain or your pain gets worse.Do not begin these exercises until told by your health care provider. RANGE OF MOTION EXERCISES These exercises warm up your muscles and joints and improve the movement and flexibility of your shoulder. These exercises also help to relieve pain, numbness, and tingling. These exercises involve stretching your injured shoulder directly. Exercise A: Pendulum  1. Stand near a wall or a surface that you can hold onto for balance. 2. Bend at the waist and let your left / right arm hang straight down. Use your other arm to support you. Keep your back straight and do not lock your knees. 3. Relax your left / right arm and shoulder muscles, and move your hips and your trunk so your left / right arm swings freely. Your arm should swing because of the motion of your body, not because you are using your arm or shoulder muscles. 4. Keep moving your body so your arm swings in the following directions, as told by your health care provider: ? Side to side. ? Forward and backward. ? In clockwise and counterclockwise circles. 5. Continue each motion for __________ seconds, or for as long as told by your health care provider. 6. Slowly return to the starting position. Repeat __________ times. Complete this exercise __________ times a day. Exercise B:Flexion, Standing  1. Stand and hold a broomstick, a cane, or a similar object. Place your hands a little more than shoulder-width apart on the object. Your left / right hand should be palm-up, and your other hand should be palm-down. 2. Keep your elbow straight and keep your shoulder muscles relaxed. Push the stick down with  your healthy arm to raise your left / right arm in front of your body, and then over your head until you feel a stretch in your shoulder. ? Avoid shrugging your shoulder while you raise your arm. Keep your shoulder blade tucked down toward the middle of your back. 3. Hold for __________ seconds. 4. Slowly return to the starting position. Repeat __________ times. Complete this exercise __________ times a day. Exercise C: Abduction, Standing 1. Stand and hold a broomstick, a cane, or a similar object. Place your hands a little more than shoulder-width apart on the object. Your left / right hand should be palm-up, and your other hand should be palm-down. 2. While keeping your elbow straight and your shoulder muscles relaxed, push the stick across your body toward your left / right side. Raise your left / right arm to the side of your body and then over your head until you feel a stretch in your shoulder. ? Do not raise your arm above shoulder height, unless your health care provider tells you to do that. ? Avoid shrugging your shoulder while you raise your arm. Keep your shoulder blade tucked down toward the middle of your back. 3. Hold for __________ seconds. 4. Slowly return to the starting position. Repeat __________ times. Complete this exercise __________ times a day. Exercise D:Internal Rotation  1. Place your left / right hand behind your back, palm-up. 2. Use your other hand to dangle an exercise band, a towel, or a similar object over your shoulder. Grasp the band with   your left / right hand so you are holding onto both ends. 3. Gently pull up on the band until you feel a stretch in the front of your left / right shoulder. ? Avoid shrugging your shoulder while you raise your arm. Keep your shoulder blade tucked down toward the middle of your back. 4. Hold for __________ seconds. 5. Release the stretch by letting go of the band and lowering your hands. Repeat __________ times. Complete  this exercise __________ times a day. STRETCHING EXERCISES These exercises warm up your muscles and joints and improve the movement and flexibility of your shoulder. These exercises also help to relieve pain, numbness, and tingling. These exercises are done using your healthy shoulder to help stretch the muscles of your injured shoulder. Exercise E: Corner Stretch (External Rotation and Abduction)  1. Stand in a doorway with one of your feet slightly in front of the other. This is called a staggered stance. If you cannot reach your forearms to the door frame, stand facing a corner of a room. 2. Choose one of the following positions as told by your health care provider: ? Place your hands and forearms on the door frame above your head. ? Place your hands and forearms on the door frame at the height of your head. ? Place your hands on the door frame at the height of your elbows. 3. Slowly move your weight onto your front foot until you feel a stretch across your chest and in the front of your shoulders. Keep your head and chest upright and keep your abdominal muscles tight. 4. Hold for __________ seconds. 5. To release the stretch, shift your weight to your back foot. Repeat __________ times. Complete this stretch __________ times a day. Exercise F:Extension, Standing 1. Stand and hold a broomstick, a cane, or a similar object behind your back. ? Your hands should be a little wider than shoulder-width apart. ? Your palms should face away from your back. 2. Keeping your elbows straight and keeping your shoulder muscles relaxed, move the stick away from your body until you feel a stretch in your shoulder. ? Avoid shrugging your shoulders while you move the stick. Keep your shoulder blade tucked down toward the middle of your back. 3. Hold for __________ seconds. 4. Slowly return to the starting position. Repeat __________ times. Complete this exercise __________ times a day. STRENGTHENING  EXERCISES These exercises build strength and endurance in your shoulder. Endurance is the ability to use your muscles for a long time, even after they get tired. Exercise G:External Rotation  1. Sit in a stable chair without armrests. 2. Secure an exercise band at elbow height on your left / right side. 3. Place a soft object, such as a folded towel or a small pillow, between your left / right upper arm and your body to move your elbow a few inches away (about 10 cm) from your side. 4. Hold the end of the band so it is tight and there is no slack. 5. Keeping your elbow pressed against the soft object, move your left / right forearm out, away from your abdomen. Keep your body steady so only your forearm moves. 6. Hold for __________ seconds. 7. Slowly return to the starting position. Repeat __________ times. Complete this exercise __________ times a day. Exercise H:Shoulder Abduction  1. Sit in a stable chair without armrests, or stand. 2. Hold a __________ weight in your left / right hand, or hold an exercise band with both hands.   3. Start with your arms straight down and your left / right palm facing in, toward your body. 4. Slowly lift your left / right hand out to your side. Do not lift your hand above shoulder height unless your health care provider tells you that this is safe. ? Keep your arms straight. ? Avoid shrugging your shoulder while you do this movement. Keep your shoulder blade tucked down toward the middle of your back. 5. Hold for __________ seconds. 6. Slowly lower your arm, and return to the starting position. Repeat __________ times. Complete this exercise __________ times a day. Exercise I:Shoulder Extension 1. Sit in a stable chair without armrests, or stand. 2. Secure an exercise band to a stable object in front of you where it is at shoulder height. 3. Hold one end of the exercise band in each hand. Your palms should face each other. 4. Straighten your elbows and  lift your hands up to shoulder height. 5. Step back, away from the secured end of the exercise band, until the band is tight and there is no slack. 6. Squeeze your shoulder blades together as you pull your hands down to the sides of your thighs. Stop when your hands are straight down by your sides. Do not let your hands go behind your body. 7. Hold for __________ seconds. 8. Slowly return to the starting position. Repeat __________ times. Complete this exercise __________ times a day. Exercise J:Standing Shoulder Row 1. Sit in a stable chair without armrests, or stand. 2. Secure an exercise band to a stable object in front of you so it is at waist height. 3. Hold one end of the exercise band in each hand. Your palms should be in a thumbs-up position. 4. Bend each of your elbows to an "L" shape (about 90 degrees) and keep your upper arms at your sides. 5. Step back until the band is tight and there is no slack. 6. Slowly pull your elbows back behind you. 7. Hold for __________ seconds. 8. Slowly return to the starting position. Repeat __________ times. Complete this exercise __________ times a day. Exercise K:Shoulder Press-Ups  1. Sit in a stable chair that has armrests. Sit upright, with your feet flat on the floor. 2. Put your hands on the armrests so your elbows are bent and your fingers are pointing forward. Your hands should be about even with the sides of your body. 3. Push down on the armrests and use your arms to lift yourself off of the chair. Straighten your elbows and lift yourself up as much as you comfortably can. ? Move your shoulder blades down, and avoid letting your shoulders move up toward your ears. ? Keep your feet on the ground. As you get stronger, your feet should support less of your body weight as you lift yourself up. 4. Hold for __________ seconds. 5. Slowly lower yourself back into the chair. Repeat __________ times. Complete this exercise __________ times a  day. Exercise L: Wall Push-Ups  1. Stand so you are facing a stable wall. Your feet should be about one arm-length away from the wall. 2. Lean forward and place your palms on the wall at shoulder height. 3. Keep your feet flat on the floor as you bend your elbows and lean forward toward the wall. 4. Hold for __________ seconds. 5. Straighten your elbows to push yourself back to the starting position. Repeat __________ times. Complete this exercise __________ times a day. This information is not intended to replace advice   given to you by your health care provider. Make sure you discuss any questions you have with your health care provider. Document Released: 09/10/2005 Document Revised: 07/21/2016 Document Reviewed: 07/08/2015 Elsevier Interactive Patient Education  2018 [employer]  

## 2018-02-11 NOTE — Progress Notes (Signed)
Office Visit Note  Patient: Joseph [name]             Date of Birth: Sep 24, 1981           MRN: [id number]             PCP: Joette CatchingNyland, Leonard, MD Referring: Joette CatchingNyland, Leonard, MD Visit Date: 02/11/2018 Occupation: @GUAROCC @    Subjective:  Medication monitoring    History of Present Illness: Joseph [name] is a 37 y.o. male with history of psoriatic arthritis.  Patient states he continues to get Remicade 5 mg/kg IV infusions every 6 weeks and methotrexate 0.6 mL every week and folic acid 1 mg daily.  He states he is due for her next Remicade infusion next week.  Patient states that he has improved since getting his infusions every 6 weeks rather than 8 weeks.  He denies any joint pain or joint swelling at this time.  He states he has very little morning stiffness for only about 10 minutes in the morning.  He states he does not have any active psoriasis at this time.  He does have some dry skin on his bilateral hands.  He uses clobetasol topically when he does have psoriasis.  He denies any SI joint pain, Achilles tendinitis, or plantar fasciitis.   Activities of Daily Living:  Patient reports morning stiffness for 10 minutes.   Patient Denies nocturnal pain.  Difficulty dressing/grooming: Denies Difficulty climbing stairs: Denies Difficulty getting out of chair: Denies Difficulty using hands for taps, buttons, cutlery, and/or writing: Denies   Review of Systems  Constitutional: Negative for fatigue and night sweats.  HENT: Negative for mouth sores, mouth dryness and nose dryness.   Eyes: Negative for redness and dryness.  Respiratory: Negative for shortness of breath and difficulty breathing.   Cardiovascular: Negative for chest pain, palpitations, hypertension, irregular heartbeat and swelling in legs/feet.  Gastrointestinal: Negative for blood in stool, constipation and diarrhea.  Endocrine: Negative for increased urination.  Genitourinary: Negative for painful urination.    Musculoskeletal: Positive for morning stiffness. Negative for arthralgias, joint pain, joint swelling, myalgias, muscle weakness, muscle tenderness and myalgias.  Skin: Negative for color change, rash, hair loss, nodules/bumps, skin tightness, ulcers and sensitivity to sunlight.  Allergic/Immunologic: Negative for susceptible to infections.  Neurological: Negative for dizziness, fainting, memory loss, night sweats and weakness.  Hematological: Negative for swollen glands.  Psychiatric/Behavioral: Negative for depressed mood and sleep disturbance. The patient is not nervous/anxious.     PMFS History:  Patient Active Problem List   Diagnosis Date Noted  . Varicose veins of both lower extremities 07/26/2017  . Acute sinus infection 11/07/2014  . Other acute sinusitis 11/07/2014  . Iron deficiency anemia 09/21/2014  . Psoriatic arthritis (HCC) 09/21/2014  . ESSENTIAL HYPERTENSION, MALIGNANT 02/18/2009  . Nonspecific (abnormal) findings on radiological and other examination of body structure 02/16/2009  . CHEST XRAY, ABNORMAL 02/16/2009    Past Medical History:  Diagnosis Date  . Acute sinus infection 11/07/2014  . Arthritis   . Blood transfusion without reported diagnosis    [facility]   . Iron deficiency anemia 09/21/2014    Family History  Problem Relation Age of Onset  . Pulmonary fibrosis Mother    Past Surgical History:  Procedure Laterality Date  . COLONOSCOPY WITH ESOPHAGOGASTRODUODENOSCOPY (EGD)    . HERNIA REPAIR  1994  . 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 He appears well-developed and well-nourished.  HENT:  Head: Normocephalic and atraumatic.  Eyes: Pupils are equal, round, and reactive to light. Conjunctivae and EOM are normal.  Neck: Normal range of motion. Neck supple.  Cardiovascular: Normal rate, regular rhythm and normal heart sounds.  Pulmonary/Chest: Effort normal and breath sounds normal.  Abdominal: Soft. Bowel sounds are normal.  Neurological: [name] alert and oriented to person, place, and time.  Skin: Skin is warm and dry. Capillary refill takes less than 2 seconds.  Psychiatric: He has a normal mood and affect. His behavior is normal.  Nursing note and vitals reviewed.    Musculoskeletal Exam: C-spine, thoracic spine, lumbar spine good range of motion.  No midline spinal tenderness.  No SI joint tenderness.  Shoulder joints, elbow joints, wrist joints, MCPs, PIPs, DIPs good range of motion with no synovitis.  Joints, knee joints, ankle joints, MTPs, PIPs, DIPs good range of motion with no synovitis.  No tenderness of trochanteric bursitis.    CDAI Exam: CDAI Homunculus Exam:   Joint Counts:  CDAI Tender Joint count: 0 CDAI Swollen Joint count: 0  Global Assessments:  Patient Global Assessment: 5 Provider Global Assessment: 5  CDAI Calculated Score: 10    Investigation: No additional findings. CBC Latest Ref Rng & Units 01/08/2018 11/27/2017 08/28/2017  WBC 3.4 - 10.8 x10E3/uL 9.5 8.3 8.3  Hemoglobin 13.0 - 17.7 g/dL 11.4(L) 11.5(L) 10.8(L)  Hematocrit 37.5 - 51.0 % 36.9(L) 36.7(L) 34.5(L)  Platelets 150 - 379 x10E3/uL 242 246 203   CMP Latest Ref Rng & Units 01/08/2018 11/27/2017 08/28/2017  Glucose 65 - 99 mg/dL 454(U) 981(X) 99  BUN 6 - 20 mg/dL 12 12 13   Creatinine 0.76 - 1.27 mg/dL 9.14 7.82 9.56  Sodium 134 - 144 mmol/L 139 136 138  Potassium 3.5 - 5.2 mmol/L 3.7 3.5 3.4(L)  Chloride 96 - 106 mmol/L 101 99 100  CO2 20 - 29 mmol/L 21 21 20   Calcium 8.7 - 10.2 mg/dL 8.3(L) 8.6(L) 8.7  Total Protein 6.0  - 8.5 g/dL 7.4 7.6 7.0  Total Bilirubin 0.0 - 1.2 mg/dL 0.5 0.4 0.5  Alkaline Phos 39 - 117 IU/L 96 94 95  AST 0 - 40 IU/L 44(H) 24 16  ALT 0 - 44 IU/L 46(H) 27 18    Imaging: No results found.  Speciality Comments: Remicade 5mg /kg every 6 weeks TB gold negative 04/03/17 Infuses at [facility]    Procedures:  No procedures performed Allergies: Bee venom and Sulfonamide derivatives   Assessment / Plan:     Visit Diagnoses: Psoriatic arthritis (HCC): No synovitis or dactylitis on exam.  He has no SI joint tenderness Achilles tendinitis or plantar fasciitis.  He has no active psoriasis at this time.  He has had no recent flares of his psoriatic arthritis.  He has improved significantly since switching his Remicade infusions every 6 weeks rather than every 8 weeks.  He will continue getting Remicade 5 mg/kg infusions every 6 weeks, methotrexate 0.6 mL every week, and folic acid 1 mg daily.  Psoriasis: No active psoriasis at this time.  High risk medication use - Remicade 5 mg/kg IV every 6 weeks, Methotrexate 0.6 mL subcutaneous every week, folic acid 1 mg by mouth daily.  CBC and CMP will be drawn with his next infusion labs.  TB gold also be due.  Future order for TB gold was placed.- Plan: QuantiFERON-TB Gold Plus, CBC with Differential/Platelet, COMPLETE METABOLIC PANEL WITH GFR  Tendinopathy of right shoulder: He has good range of motion of his right shoulder.  He has some discomfort with with forward flexion.  He was given a handout on shoulder exercises that he can perform at home.  He declined a shoulder x-ray at this time.  Other medical conditions are listed as follows:  History of iron deficiency anemia  Primary osteoarthritis of both hands  History of hypertension    Orders: Orders Placed This Encounter  Procedures  . QuantiFERON-TB Gold Plus  . CBC with Differential/Platelet  . COMPLETE METABOLIC PANEL WITH GFR   Meds ordered this encounter  Medications  . clobetasol  cream (TEMOVATE) 0.05 %    Sig: Apply topically 2 (two) times daily as needed.    Dispense:  30 g    Refill:  1    Follow-Up Instructions: Return in about 5 months (around 07/14/2018) for Psoriatic arthritis.   Gearldine Bienenstock, PA-C   I examined and evaluated the patient with Sherron Ales PA. The plan of care was discussed as noted above.  Pollyann Savoy, MD  Note - This record has been created using Animal nutritionist.  Chart creation errors have been sought, but may not always  have been located. Such creation errors do not reflect on  the standard of medical care.

## 2018-02-12 ENCOUNTER — Telehealth: Payer: Self-pay

## 2018-02-12 NOTE — Telephone Encounter (Signed)
Received a prior authorization request for Clobetasol 0.05% cream for pt from [facility]. Working on the authorization via cover my meds. It asks if the pt has tried and failed Clobetasol 0.05% ointment, shampoo or solution/ Clobetasol emollient 0.05% cream, clodan 0.05% shampoo, halobetasol 0.05% cream or ointment.   Called pt to clarify. Will update once we have a response.   Virgel Haro, [place], CPhT 9:29 AM

## 2018-02-12 NOTE — Telephone Encounter (Signed)
Pts wife, Shanda BumpsJessica returned call. She states that pt has only tried the cream for his psoriasis. Authorization was updated and submitted. Received an immediate response the it was approved from 02/12/2018 through 02/11/2019. she voices understanding and denies any questions at this time.   Will send document to scan center once received.   Dahiana Kulak, [place], CPhT 9:45 AM

## 2018-02-19 NOTE — Addendum Note (Signed)
Addended by: Tamera StandsHINNANT, Tekla Malachowski D on: 02/19/2018 08:43 AM   Modules accepted: Orders

## 2018-02-20 LAB — CBC WITH DIFFERENTIAL/PLATELET
BASOS ABS: 0 10*3/uL (ref 0.0–0.2)
Basos: 0 %
EOS (ABSOLUTE): 0.2 10*3/uL (ref 0.0–0.4)
EOS: 2 %
HEMATOCRIT: 36.1 % — AB (ref 37.5–51.0)
HEMOGLOBIN: 11 g/dL — AB (ref 13.0–17.7)
Immature Grans (Abs): 0 10*3/uL (ref 0.0–0.1)
Immature Granulocytes: 0 %
LYMPHS ABS: 3.6 10*3/uL — AB (ref 0.7–3.1)
Lymphs: 34 %
MCH: 23.7 pg — ABNORMAL LOW (ref 26.6–33.0)
MCHC: 30.5 g/dL — AB (ref 31.5–35.7)
MCV: 78 fL — AB (ref 79–97)
MONOCYTES: 7 %
Monocytes Absolute: 0.7 10*3/uL (ref 0.1–0.9)
Neutrophils Absolute: 6.1 10*3/uL (ref 1.4–7.0)
Neutrophils: 57 %
Platelets: 247 10*3/uL (ref 150–379)
RBC: 4.65 x10E6/uL (ref 4.14–5.80)
RDW: 19 % — ABNORMAL HIGH (ref 12.3–15.4)
WBC: 10.7 10*3/uL (ref 3.4–10.8)

## 2018-02-20 LAB — COMPREHENSIVE METABOLIC PANEL
A/G RATIO: 1.2 (ref 1.2–2.2)
ALBUMIN: 4 g/dL (ref 3.5–5.5)
ALK PHOS: 95 IU/L (ref 39–117)
ALT: 24 IU/L (ref 0–44)
AST: 25 IU/L (ref 0–40)
BILIRUBIN TOTAL: 0.5 mg/dL (ref 0.0–1.2)
BUN / CREAT RATIO: 13 (ref 9–20)
BUN: 14 mg/dL (ref 6–20)
CHLORIDE: 98 mmol/L (ref 96–106)
CO2: 23 mmol/L (ref 20–29)
Calcium: 8.6 mg/dL — ABNORMAL LOW (ref 8.7–10.2)
Creatinine, Ser: 1.1 mg/dL (ref 0.76–1.27)
GFR calc non Af Amer: 87 mL/min/{1.73_m2} (ref >59)
GFR, EST AFRICAN AMERICAN: 101 mL/min/{1.73_m2} (ref >59)
GLOBULIN, TOTAL: 3.3 g/dL (ref 1.5–4.5)
Glucose: 89 mg/dL (ref 65–99)
Potassium: 3.6 mmol/L (ref 3.5–5.2)
SODIUM: 139 mmol/L (ref 134–144)
TOTAL PROTEIN: 7.3 g/dL (ref 6.0–8.5)

## 2018-04-02 NOTE — Addendum Note (Signed)
Addended by: Tamera Stands D on: 04/02/2018 08:32 AM   Modules accepted: Orders

## 2018-04-03 LAB — CBC WITH DIFFERENTIAL/PLATELET
BASOS: 0 %
Basophils Absolute: 0 10*3/uL (ref 0.0–0.2)
EOS (ABSOLUTE): 0.2 10*3/uL (ref 0.0–0.4)
EOS: 2 %
Hematocrit: 35.7 % — ABNORMAL LOW (ref 37.5–51.0)
Hemoglobin: 11.1 g/dL — ABNORMAL LOW (ref 13.0–17.7)
IMMATURE GRANS (ABS): 0 10*3/uL (ref 0.0–0.1)
IMMATURE GRANULOCYTES: 0 %
LYMPHS: 34 %
Lymphocytes Absolute: 3.2 10*3/uL — ABNORMAL HIGH (ref 0.7–3.1)
MCH: 23.6 pg — ABNORMAL LOW (ref 26.6–33.0)
MCHC: 31.1 g/dL — ABNORMAL LOW (ref 31.5–35.7)
MCV: 76 fL — AB (ref 79–97)
MONOS ABS: 0.8 10*3/uL (ref 0.1–0.9)
Monocytes: 8 %
NEUTROS PCT: 56 %
Neutrophils Absolute: 5.2 10*3/uL (ref 1.4–7.0)
Platelets: 251 10*3/uL (ref 150–450)
RBC: 4.71 x10E6/uL (ref 4.14–5.80)
RDW: 17.6 % — AB (ref 12.3–15.4)
WBC: 9.4 10*3/uL (ref 3.4–10.8)

## 2018-04-03 LAB — COMPREHENSIVE METABOLIC PANEL
A/G RATIO: 1.2 (ref 1.2–2.2)
ALT: 15 IU/L (ref 0–44)
AST: 16 IU/L (ref 0–40)
Albumin: 3.9 g/dL (ref 3.5–5.5)
Alkaline Phosphatase: 99 IU/L (ref 39–117)
BUN / CREAT RATIO: 11 (ref 9–20)
BUN: 11 mg/dL (ref 6–20)
Bilirubin Total: 0.5 mg/dL (ref 0.0–1.2)
CALCIUM: 8.3 mg/dL — AB (ref 8.7–10.2)
CO2: 21 mmol/L (ref 20–29)
CREATININE: 1.04 mg/dL (ref 0.76–1.27)
Chloride: 101 mmol/L (ref 96–106)
GFR calc Af Amer: 108 mL/min/{1.73_m2} (ref >59)
GFR calc non Af Amer: 93 mL/min/{1.73_m2} (ref >59)
GLOBULIN, TOTAL: 3.2 g/dL (ref 1.5–4.5)
Glucose: 129 mg/dL — ABNORMAL HIGH (ref 65–99)
POTASSIUM: 3.4 mmol/L — AB (ref 3.5–5.2)
SODIUM: 138 mmol/L (ref 134–144)
TOTAL PROTEIN: 7.1 g/dL (ref 6.0–8.5)

## 2018-05-12 NOTE — Addendum Note (Signed)
Addended by: Tamera StandsHINNANT, Ngina Royer D on: 05/12/2018 08:29 AM   Modules accepted: Orders

## 2018-05-13 LAB — COMPREHENSIVE METABOLIC PANEL
ALBUMIN: 4.1 g/dL (ref 3.5–5.5)
ALT: 20 IU/L (ref 0–44)
AST: 16 IU/L (ref 0–40)
Albumin/Globulin Ratio: 1.1 — ABNORMAL LOW (ref 1.2–2.2)
Alkaline Phosphatase: 102 IU/L (ref 39–117)
BUN / CREAT RATIO: 11 (ref 9–20)
BUN: 13 mg/dL (ref 6–20)
Bilirubin Total: 0.3 mg/dL (ref 0.0–1.2)
CALCIUM: 8.4 mg/dL — AB (ref 8.7–10.2)
CO2: 21 mmol/L (ref 20–29)
CREATININE: 1.14 mg/dL (ref 0.76–1.27)
Chloride: 101 mmol/L (ref 96–106)
GFR calc Af Amer: 96 mL/min/{1.73_m2} (ref >59)
GFR, EST NON AFRICAN AMERICAN: 83 mL/min/{1.73_m2} (ref >59)
GLOBULIN, TOTAL: 3.7 g/dL (ref 1.5–4.5)
Glucose: 93 mg/dL (ref 65–99)
Potassium: 3.8 mmol/L (ref 3.5–5.2)
SODIUM: 137 mmol/L (ref 134–144)
Total Protein: 7.8 g/dL (ref 6.0–8.5)

## 2018-05-13 LAB — CBC WITH DIFFERENTIAL/PLATELET
BASOS: 0 %
Basophils Absolute: 0 10*3/uL (ref 0.0–0.2)
EOS (ABSOLUTE): 0.5 10*3/uL — AB (ref 0.0–0.4)
Eos: 5 %
HEMATOCRIT: 36.6 % — AB (ref 37.5–51.0)
HEMOGLOBIN: 10.9 g/dL — AB (ref 13.0–17.7)
IMMATURE GRANS (ABS): 0 10*3/uL (ref 0.0–0.1)
Immature Granulocytes: 0 %
LYMPHS: 36 %
Lymphocytes Absolute: 3.7 10*3/uL — ABNORMAL HIGH (ref 0.7–3.1)
MCH: 22.6 pg — ABNORMAL LOW (ref 26.6–33.0)
MCHC: 29.8 g/dL — ABNORMAL LOW (ref 31.5–35.7)
MCV: 76 fL — AB (ref 79–97)
Monocytes Absolute: 0.8 10*3/uL (ref 0.1–0.9)
Monocytes: 8 %
NEUTROS ABS: 5.1 10*3/uL (ref 1.4–7.0)
Neutrophils: 51 %
Platelets: 237 10*3/uL (ref 150–450)
RBC: 4.83 x10E6/uL (ref 4.14–5.80)
RDW: 17.7 % — AB (ref 12.3–15.4)
WBC: 10.1 10*3/uL (ref 3.4–10.8)

## 2018-06-29 ENCOUNTER — Other Ambulatory Visit: Payer: Self-pay

## 2018-06-29 ENCOUNTER — Other Ambulatory Visit: Payer: Self-pay | Admitting: Neurology

## 2018-06-29 DIAGNOSIS — Z5181 Encounter for therapeutic drug level monitoring: Secondary | ICD-10-CM

## 2018-06-30 LAB — COMPREHENSIVE METABOLIC PANEL
ALBUMIN: 3.8 g/dL (ref 3.5–5.5)
ALT: 23 IU/L (ref 0–44)
AST: 21 IU/L (ref 0–40)
Albumin/Globulin Ratio: 1.1 — ABNORMAL LOW (ref 1.2–2.2)
Alkaline Phosphatase: 97 IU/L (ref 39–117)
BILIRUBIN TOTAL: 0.4 mg/dL (ref 0.0–1.2)
BUN / CREAT RATIO: 9 (ref 9–20)
BUN: 9 mg/dL (ref 6–20)
CALCIUM: 8.5 mg/dL — AB (ref 8.7–10.2)
CHLORIDE: 102 mmol/L (ref 96–106)
CO2: 23 mmol/L (ref 20–29)
Creatinine, Ser: 1 mg/dL (ref 0.76–1.27)
GFR calc Af Amer: 112 mL/min/{1.73_m2} (ref >59)
GFR, EST NON AFRICAN AMERICAN: 97 mL/min/{1.73_m2} (ref >59)
GLOBULIN, TOTAL: 3.5 g/dL (ref 1.5–4.5)
Glucose: 92 mg/dL (ref 65–99)
Potassium: 3.9 mmol/L (ref 3.5–5.2)
SODIUM: 139 mmol/L (ref 134–144)
TOTAL PROTEIN: 7.3 g/dL (ref 6.0–8.5)

## 2018-06-30 LAB — CBC WITH DIFFERENTIAL/PLATELET
BASOS: 0 %
Basophils Absolute: 0 10*3/uL (ref 0.0–0.2)
EOS (ABSOLUTE): 0.2 10*3/uL (ref 0.0–0.4)
EOS: 2 %
HEMATOCRIT: 37.6 % (ref 37.5–51.0)
Hemoglobin: 11.4 g/dL — ABNORMAL LOW (ref 13.0–17.7)
IMMATURE GRANS (ABS): 0 10*3/uL (ref 0.0–0.1)
IMMATURE GRANULOCYTES: 0 %
LYMPHS: 40 %
Lymphocytes Absolute: 3.3 10*3/uL — ABNORMAL HIGH (ref 0.7–3.1)
MCH: 22.4 pg — AB (ref 26.6–33.0)
MCHC: 30.3 g/dL — ABNORMAL LOW (ref 31.5–35.7)
MCV: 74 fL — AB (ref 79–97)
Monocytes Absolute: 0.5 10*3/uL (ref 0.1–0.9)
Monocytes: 6 %
NEUTROS ABS: 4.3 10*3/uL (ref 1.4–7.0)
Neutrophils: 52 %
PLATELETS: 222 10*3/uL (ref 150–450)
RBC: 5.1 x10E6/uL (ref 4.14–5.80)
RDW: 17.4 % — ABNORMAL HIGH (ref 12.3–15.4)
WBC: 8.2 10*3/uL (ref 3.4–10.8)

## 2018-07-05 NOTE — Progress Notes (Deleted)
Office Visit Note  Patient: Joseph [name]             Date of Birth: 07/28/1981           MRN: [id number]             PCP: Joette CatchingNyland, Leonard, MD Referring: Joette CatchingNyland, Leonard, MD Visit Date: 07/19/2018 Occupation: @GUAROCC @  Subjective:  No chief complaint on file.   History of Present Illness: Joseph [name] is a 37 y.o. male ***   Activities of Daily Living:  Patient reports morning stiffness for *** {minute/hour:19697}.   Patient {ACTIONS;DENIES/REPORTS:21021675::"Denies"} nocturnal pain.  Difficulty dressing/grooming: {ACTIONS;DENIES/REPORTS:21021675::"Denies"} Difficulty climbing stairs: {ACTIONS;DENIES/REPORTS:21021675::"Denies"} Difficulty getting out of chair: {ACTIONS;DENIES/REPORTS:21021675::"Denies"} Difficulty using hands for taps, buttons, cutlery, and/or writing: {ACTIONS;DENIES/REPORTS:21021675::"Denies"}  No Rheumatology ROS completed.   PMFS History:  Patient Active Problem List   Diagnosis Date Noted  . Varicose veins of both lower extremities 07/26/2017  . Acute sinus infection 11/07/2014  . Other acute sinusitis 11/07/2014  . Iron deficiency anemia 09/21/2014  . Psoriatic arthritis (HCC) 09/21/2014  . ESSENTIAL HYPERTENSION, MALIGNANT 02/18/2009  . Nonspecific (abnormal) findings on radiological and other examination of body structure 02/16/2009  . CHEST XRAY, ABNORMAL 02/16/2009    Past Medical History:  Diagnosis Date  . Acute sinus infection 11/07/2014  . Arthritis   . Blood transfusion without reported diagnosis    [facility]   . Iron deficiency anemia 09/21/2014    Family History  Problem Relation Age of Onset  . Pulmonary fibrosis Mother    Past Surgical History:  Procedure Laterality Date  . COLONOSCOPY WITH ESOPHAGOGASTRODUODENOSCOPY (EGD)    . HERNIA REPAIR  1994  . 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   Physical Exam   Musculoskeletal Exam: ***  CDAI Exam: CDAI Score: Not documented Patient Global Assessment: Not documented; Provider Global Assessment: Not documented Swollen: Not documented; Tender: Not documented Joint Exam   Not documented   There is currently no information documented on the homunculus. Go to the Rheumatology activity and complete the homunculus joint exam.  Investigation: No additional findings.  Imaging: No results found.  Recent Labs: Lab Results  Component Value Date   WBC 8.2 06/29/2018   HGB 11.4 (L) 06/29/2018   PLT 222 06/29/2018   NA 139 06/29/2018   K 3.9 06/29/2018   CL 102 06/29/2018   CO2 23 06/29/2018   GLUCOSE 92 06/29/2018   BUN 9 06/29/2018   CREATININE 1.00 06/29/2018   BILITOT 0.4 06/29/2018   ALKPHOS 97 06/29/2018   AST 21 06/29/2018   ALT 23 06/29/2018   PROT 7.3 06/29/2018   ALBUMIN 3.8 06/29/2018   CALCIUM 8.5 (L) 06/29/2018   GFRAA 112 06/29/2018   QFTBGOLD Negative 04/03/2017    Speciality Comments: Remicade 5mg /kg every 6 weeks TB gold negative 5/25/18Infuses at [facility]  Procedures:  No procedures performed Allergies: Bee venom and Sulfonamide derivatives   Assessment / Plan:     Visit Diagnoses: Psoriatic arthritis (HCC)  Psoriasis  High risk medication use - Remicade 5 mg/kg IV every 6 weeks, Methotrexate 0.6 mL subcutaneous every week, folic acid 1 mg by mouth daily.   Primary osteoarthritis of both hands  Tendinopathy of right shoulder  History of  hypertension  History of iron deficiency anemia  Varicose veins of both lower extremities, unspecified whether complicated   Orders: No orders of the defined types were placed in this encounter.  No orders of the defined types were placed in this encounter.   Face-to-face time spent with patient was *** minutes. Greater than 50% of time was spent in counseling and  coordination of care.  Follow-Up Instructions: No follow-ups on file.   Gearldine Bienenstock, PA-C  Note - This record has been created using Dragon software.  Chart creation errors have been sought, but may not always  have been located. Such creation errors do not reflect on  the standard of medical care.

## 2018-07-15 NOTE — Progress Notes (Deleted)
Office Visit Note  Patient: Joseph [name]             Date of Birth: 03-May-1981           MRN: [id number]             PCP: Joette Catching, MD Referring: Joette Catching, MD Visit Date: 07/21/2018 Occupation: @GUAROCC @  Subjective:  No chief complaint on file.   History of Present Illness: FRITZ CAUTHON is a 37 y.o. male ***   Activities of Daily Living:  Patient reports morning stiffness for *** {minute/hour:19697}.   Patient {ACTIONS;DENIES/REPORTS:21021675::"Denies"} nocturnal pain.  Difficulty dressing/grooming: {ACTIONS;DENIES/REPORTS:21021675::"Denies"} Difficulty climbing stairs: {ACTIONS;DENIES/REPORTS:21021675::"Denies"} Difficulty getting out of chair: {ACTIONS;DENIES/REPORTS:21021675::"Denies"} Difficulty using hands for taps, buttons, cutlery, and/or writing: {ACTIONS;DENIES/REPORTS:21021675::"Denies"}  No Rheumatology ROS completed.   PMFS History:  Patient Active Problem List   Diagnosis Date Noted  . Varicose veins of both lower extremities 07/26/2017  . Acute sinus infection 11/07/2014  . Other acute sinusitis 11/07/2014  . Iron deficiency anemia 09/21/2014  . Psoriatic arthritis (HCC) 09/21/2014  . ESSENTIAL HYPERTENSION, MALIGNANT 02/18/2009  . Nonspecific (abnormal) findings on radiological and other examination of body structure 02/16/2009  . CHEST XRAY, ABNORMAL 02/16/2009    Past Medical History:  Diagnosis Date  . Acute sinus infection 11/07/2014  . Arthritis   . Blood transfusion without reported diagnosis    At [facility]   . Iron deficiency anemia 09/21/2014    Family History  Problem Relation Age of Onset  . Pulmonary fibrosis Mother    Past Surgical History:  Procedure Laterality Date  . COLONOSCOPY WITH ESOPHAGOGASTRODUODENOSCOPY (EGD)    . HERNIA REPAIR  1994  . 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   Physical Exam   Musculoskeletal Exam: ***  CDAI Exam: CDAI Score: Not documented Patient Global Assessment: Not documented; Provider Global Assessment: Not documented Swollen: Not documented; Tender: Not documented Joint Exam   Not documented   There is currently no information documented on the homunculus. Go to the Rheumatology activity and complete the homunculus joint exam.  Investigation: No additional findings.  Imaging: No results found.  Recent Labs: Lab Results  Component Value Date   WBC 8.2 06/29/2018   HGB 11.4 (L) 06/29/2018   PLT 222 06/29/2018   NA 139 06/29/2018   K 3.9 06/29/2018   CL 102 06/29/2018   CO2 23 06/29/2018   GLUCOSE 92 06/29/2018   BUN 9 06/29/2018   CREATININE 1.00 06/29/2018   BILITOT 0.4 06/29/2018   ALKPHOS 97 06/29/2018   AST 21 06/29/2018   ALT 23 06/29/2018   PROT 7.3 06/29/2018   ALBUMIN 3.8 06/29/2018   CALCIUM 8.5 (L) 06/29/2018   GFRAA 112 06/29/2018   QFTBGOLD Negative 04/03/2017    Speciality Comments: Remicade 5mg /kg every 6 weeks TB gold negative 5/25/18Infuses at [facility]  Procedures:  No procedures performed Allergies: Bee venom and Sulfonamide derivatives   Assessment / Plan:     Visit Diagnoses: Psoriatic arthritis (HCC)  Psoriasis  High risk medication use - Remicade 5 mg/kg infusions every 6 weeks, methotrexate 0.6 mL every week, and folic acid 1 mg daily.  Tendinopathy of right shoulder  Primary osteoarthritis of both hands  Varicose veins of both lower  extremities, unspecified whether complicated  Iron deficiency anemia  History of hypertension   Orders: No orders of the defined types were placed in this encounter.  No orders of the defined types were placed in this encounter.   Face-to-face time spent with patient was *** minutes. Greater than 50% of time was spent in counseling and coordination of  care.  Follow-Up Instructions: No follow-ups on file.   Gearldine Bienenstock, PA-C  Note - This record has been created using Dragon software.  Chart creation errors have been sought, but may not always  have been located. Such creation errors do not reflect on  the standard of medical care.

## 2018-07-19 ENCOUNTER — Ambulatory Visit: Payer: BLUE CROSS/BLUE SHIELD | Admitting: Physician Assistant

## 2018-07-21 ENCOUNTER — Ambulatory Visit: Payer: BLUE CROSS/BLUE SHIELD | Admitting: Physician Assistant

## 2018-07-23 ENCOUNTER — Ambulatory Visit: Payer: BLUE CROSS/BLUE SHIELD | Admitting: Rheumatology

## 2018-07-23 ENCOUNTER — Encounter: Payer: Self-pay | Admitting: Physician Assistant

## 2018-07-23 ENCOUNTER — Telehealth: Payer: Self-pay | Admitting: Pharmacy Technician

## 2018-07-23 VITALS — BP 156/97 | HR 70 | Resp 16 | Ht 70.0 in | Wt 308.0 lb

## 2018-07-23 DIAGNOSIS — Z862 Personal history of diseases of the blood and blood-forming organs and certain disorders involving the immune mechanism: Secondary | ICD-10-CM

## 2018-07-23 DIAGNOSIS — M19041 Primary osteoarthritis, right hand: Secondary | ICD-10-CM | POA: Diagnosis not present

## 2018-07-23 DIAGNOSIS — L409 Psoriasis, unspecified: Secondary | ICD-10-CM

## 2018-07-23 DIAGNOSIS — Z8679 Personal history of other diseases of the circulatory system: Secondary | ICD-10-CM

## 2018-07-23 DIAGNOSIS — I8393 Asymptomatic varicose veins of bilateral lower extremities: Secondary | ICD-10-CM

## 2018-07-23 DIAGNOSIS — M67911 Unspecified disorder of synovium and tendon, right shoulder: Secondary | ICD-10-CM

## 2018-07-23 DIAGNOSIS — Z79899 Other long term (current) drug therapy: Secondary | ICD-10-CM | POA: Diagnosis not present

## 2018-07-23 DIAGNOSIS — M19042 Primary osteoarthritis, left hand: Secondary | ICD-10-CM

## 2018-07-23 DIAGNOSIS — L4050 Arthropathic psoriasis, unspecified: Secondary | ICD-10-CM

## 2018-07-23 NOTE — Progress Notes (Signed)
Office Visit Note  Patient: Joseph [name]             Date of Birth: 30-Mar-1981           MRN: [id number]             PCP: Joette Catching, MD Referring: Joette Catching, MD Visit Date: 07/23/2018 Occupation: @GUAROCC @  Subjective:  Left ankle pain   History of Present Illness: Joseph [name] is a 37 y.o. male with history of psoriatic arthritis and osteoarthritis.  He receives Remicade infusions every 6 weeks, MTX 0.6 ml once a week, and folic acid 1 mg po daily.   He reports he had a flare about 1 week ago in his left foot.  He states he is currently having discomfort in bilateral ankle joints.  He states that he takes every morning with pain and swelling and stiffness.  He denies any Achilles tendinitis or plantar fasciitis at this time.  He denies any psoriasis.  He states that he has SI joint pain bilaterally if he is standing for prolonged periods of time.  He states that he has noticed some benefit since increasing the frequency of Remicade infusions every 6 weeks.  He states his next infusion is 08/13/2018.  He states that he is interested in trying another medication besides Remicade due to cost and time of infusions.  He states he continues to have breakthrough pain and swelling before his next infusion is due.    Activities of Daily Living:  Patient reports morning stiffness for 10-15 minutes.   Patient Reports nocturnal pain.  Difficulty dressing/grooming: Denies Difficulty climbing stairs: Denies Difficulty getting out of chair: Denies Difficulty using hands for taps, buttons, cutlery, and/or writing: Denies  Review of Systems  Constitutional: Negative for fatigue and night sweats.  HENT: Negative for mouth sores, trouble swallowing, trouble swallowing, mouth dryness and nose dryness.   Eyes: Negative for redness, visual disturbance and dryness.  Respiratory: Negative for cough, hemoptysis, shortness of breath and difficulty breathing.   Cardiovascular: Negative for  chest pain, palpitations, hypertension, irregular heartbeat and swelling in legs/feet.  Gastrointestinal: Negative for blood in stool, constipation and diarrhea.  Endocrine: Negative for increased urination.  Genitourinary: Negative for painful urination.  Musculoskeletal: Positive for arthralgias, joint pain, joint swelling and morning stiffness. Negative for myalgias, muscle weakness, muscle tenderness and myalgias.  Skin: Negative for color change, rash, hair loss, nodules/bumps, skin tightness, ulcers and sensitivity to sunlight.  Allergic/Immunologic: Negative for susceptible to infections.  Neurological: Negative for dizziness, fainting, memory loss, night sweats and weakness.  Hematological: Negative for swollen glands.  Psychiatric/Behavioral: Negative for depressed mood and sleep disturbance. The patient is not nervous/anxious.     PMFS History:  Patient Active Problem List   Diagnosis Date Noted  . Varicose veins of both lower extremities 07/26/2017  . Acute sinus infection 11/07/2014  . Other acute sinusitis 11/07/2014  . Iron deficiency anemia 09/21/2014  . Psoriatic arthritis (HCC) 09/21/2014  . ESSENTIAL HYPERTENSION, MALIGNANT 02/18/2009  . Nonspecific (abnormal) findings on radiological and other examination of body structure 02/16/2009  . CHEST XRAY, ABNORMAL 02/16/2009    Past Medical History:  Diagnosis Date  . Acute sinus infection 11/07/2014  . Arthritis   . Blood transfusion without reported diagnosis    At [facility]   . Iron deficiency anemia 09/21/2014    Family History  Problem Relation Age of Onset  . Pulmonary fibrosis Mother    Past Surgical History:  Procedure Laterality Date  .  COLONOSCOPY WITH ESOPHAGOGASTRODUODENOSCOPY (EGD)    . HERNIA REPAIR  1994  . 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 He appears well-developed and well-nourished.  HENT:  Head: Normocephalic and atraumatic.  Eyes: Pupils are equal, round, and reactive to light. Conjunctivae and EOM are normal.  Neck: Normal range of motion. Neck supple.  Cardiovascular: Normal rate, regular rhythm and normal heart sounds.  Pulmonary/Chest: Effort normal and breath sounds normal.  Abdominal: Soft. Bowel sounds are normal.  Lymphadenopathy:    He has no cervical adenopathy.  Neurological: [name] alert and oriented to person, place, and time.  Skin: Skin is warm and dry. Capillary refill takes less than 2 seconds.  Psychiatric: He has a normal mood and affect. His behavior is normal.  Nursing note and vitals reviewed.    Musculoskeletal Exam: C-spine, thoracic spine, lumbar spine good range of motion.  No midline spinal tenderness.  No SI joint tenderness.  Shoulder joints, elbow joints, wrist joints, MCPs, PIPs, DIPs good range of motion no synovitis.  [name] complete fist formation bilaterally.  Hip joints, knee joints good range of motion with no synovitis.  No warmth or effusion of bilateral knee joints.  He has tenderness and mild swelling of bilateral ankle joints.  He has limited range of motion of bilateral ankle joints.  No Achilles tendinitis or plantar fasciitis.  No tenderness of trochanteric bursa bilaterally.  CDAI Exam: CDAI Score: 1.2  Patient Global Assessment: 7 (mm); Provider Global Assessment: 5 (mm) Swollen: 0 ; Tender: 2  Joint Exam      Right  Left  Ankle   Tender   Tender   There is currently no information documented on the homunculus. Go to the Rheumatology activity and complete the homunculus joint  exam.  Investigation: No additional findings.  Imaging: No results found.  Recent Labs: Lab Results  Component Value Date   WBC 8.2 06/29/2018   HGB 11.4 (L) 06/29/2018   PLT 222 06/29/2018   NA 139 06/29/2018   K 3.9 06/29/2018   CL 102 06/29/2018   CO2 23 06/29/2018   GLUCOSE 92 06/29/2018   BUN 9 06/29/2018   CREATININE 1.00 06/29/2018   BILITOT 0.4 06/29/2018   ALKPHOS 97 06/29/2018   AST 21 06/29/2018   ALT 23 06/29/2018   PROT 7.3 06/29/2018   ALBUMIN 3.8 06/29/2018   CALCIUM 8.5 (L) 06/29/2018   GFRAA 112 06/29/2018   QFTBGOLD Negative 04/03/2017    Speciality Comments: Remicade 5mg /kg every 6 weeks TB gold negative 04/03/17 Infuses at [facility]  Procedures:  No procedures performed Allergies: Bee venom and Sulfonamide derivatives        Assessment / Plan:     Visit Diagnoses: Psoriatic arthritis (HCC):  He has no synovitis or dactylitis on exam.  He has been having increased breakthrough pain and swelling in bilateral ankle joints, worse in the left ankle.  He had a flare in the left foot 1 week  ago.  He has tenderness of both ankles on exam today.  He has no achilles tendonitis or plantar fasciitis.  No SI joint tenderness on exam.  He has no active psoriasis at this time.  He has noticed some benefit since increasing the frequency of Remicade infusions to every 6 weeks rather than every 8 weeks.  He continues to inject MTX 0.6 ml once a week and taking folic acid 1 mg daily. Him and his wife brought up switching to Cosentyx while in the office today.  Their insurance will be changing to [employer] on 08/10/18.  Since he continues to experience flares and breakthrough pain, we are going to apply for Cosentyx. He has failed Enbrel and Humira in the past.  Indications, contraindications, potential side effects were discussed in the office today.  Questions were addressed.  Consent was obtained today.  His next infusion for Remicade is due on 08/13/2018.  He was instructed that  he will not be able to start on Cosentyx to after this date.  He will come for a nurse visit for his first injection once everything is approved.  He will continue on methotrexate 0.6 mL subcutaneously once weekly and folic acid 1 mg daily.  He will follow-up in the office in 3 months.  [name] advised to notify us if he develops increased joint pain or joint swelling.  Association of heart disease with psoriatic arthritis was discussed. Need to monitor blood pressure, cholesterol, and to exercise 30-60 minutes on daily basis was discussed.   Medication counseling: TB Gold: Pending  Hepatitis panel: negative 01/31/09 HIV: Pending SPEP: Pending Immunoglobulin: Pending  Does patient have a history of inflammatory bowel disease? No  Counseled patient that Cosentyx is a IL-17 inhibitor that works to reduce pain and inflammation associated with arthritis.  Counseled patient on purpose, proper use, and adverse effects of Cosentyx. Reviewed the most common adverse effects of infection, inflammatory bowel disease, and allergic reaction.  Reviewed the importance of regular labs while on Cosentyx.  Counseled patient that Cosentyx should be held prior to scheduled surgery.  Counseled patient to avoid live vaccines while on Cosentyx.  Advised patient to get annual influenza vaccine and the pneumococcal vaccine as indicated.  Provided patient with medication education material and answered all questions.  Patient consented to Cosentyx.  Will upload consent into patient's chart.  Will apply for Cosentyx through patient's insurance.  Reviewed storage information for Cosentyx.  Advised initial injection must be administered in office.  Patient voiced understanding.    Psoriasis: He has no psoriasis at this time.   High risk medication use -We are applying for Cosentyx. He will be discontinuing Remicade 5 mg/kg IV every 6 weeks, Methotrexate 0.6 mL subcutaneous every week, folic acid 1 mg by mouth daily.  Patient  failed Enbrel prior to Remicade.  TB gold, HIV, immunoglobulins, and SPEP will be checked today.  - Plan: QuantiFERON-TB Gold Plus, HIV Antibody (routine testing w rflx), IgG, IgA, IgM, Serum protein electrophoresis with reflex  Primary osteoarthritis of both hands: He has PIP and DIP synovial thickening consistent with osteoarthritis of both hands.  Joint protection and muscle strengthening were discussed.   Tendinopathy of right shoulder: He has no discomfort at this time.  He has full ROM on exam.   History of iron deficiency anemia: He has been increasing his intake of red meat as a source of iron.  We discussed other dietary modifications he can make.  He was encouraged to try to  avoid red meats.   Other medical conditions are listed as follows:   History of hypertension  Varicose veins of both lower extremities, unspecified whether complicated   Orders: Orders Placed This Encounter  Procedures  . QuantiFERON-TB Gold Plus  . HIV Antibody (routine testing w rflx)  . IgG, IgA, IgM  . Serum protein electrophoresis with reflex   No orders of the defined types were placed in this encounter.   Face-to-face time spent with patient was 30 minutes. Greater than 50% of time was spent in counseling and coordination of care.  Follow-Up Instructions: Return in about 3 months (around 10/22/2018) for Psoriatic arthritis, Osteoarthritis.   Gearldine Bienenstock, PA-C   I examined and evaluated the patient with Sherron Ales PA.  Patient has been having increased joint pain and swelling recently.  He had good response to Remicade initially.  He also failed Enbrel in the past.  We had detailed discussion regarding different treatment options and their side effects.  After reviewing indications side effects contraindications he wanted to proceed with Cosentyx.  Handout was given and consent was taken.  We will apply for Cosentyx.  He will get off Remicade for 6 weeks prior to the first Cosentyx injection.   The plan of care was discussed as noted above.  Pollyann Savoy, MD  Note - This record has been created using Animal nutritionist.  Chart creation errors have been sought, but may not always  have been located. Such creation errors do not reflect on  the standard of medical care.

## 2018-07-23 NOTE — Telephone Encounter (Signed)
Discussed process of Cosentyx 300mg  Prior Authorization and Patient Assistance with patient. His insurance will change to [employer], effective 08/10/18. Will initiate on 08/10/18. Patient consented and signed patient Assistance Forms.Will follow up.  9:58 AM Dorthula Nettlesachael N Bresha Hosack, CPhT

## 2018-07-23 NOTE — Patient Instructions (Addendum)
Secukinumab injection What is this medicine? SECUKINUMAB (sek ue KIN ue mab) is used to treat psoriasis. It is also used to treat psoriatic arthritis and ankylosing spondylitis. This medicine may be used for other purposes; ask your health care provider or pharmacist if you have questions. COMMON BRAND NAME(S): Cosentyx What should I tell my health care provider before I take this medicine? They need to know if you have any of these conditions: -Crohn's disease, ulcerative colitis, or other inflammatory bowel disease -infection or history of infection -other conditions affecting the immune system -recently received or are scheduled to receive a vaccine -tuberculosis, a positive skin test for tuberculosis, or have recently been in close contact with someone who has tuberculosis -an unusual or allergic reaction to secukinumab, other medicines, latex, rubber, foods, dyes, or preservatives -pregnant or trying to get pregnant -breast-feeding How should I use this medicine? This medicine is for injection under the skin. It may be administered by a healthcare professional in a hospital or clinic setting or at home. If you get this medicine at home, you will be taught how to prepare and give this medicine. Use exactly as directed. Take your medicine at regular intervals. Do not take your medicine more often than directed. It is important that you put your used needles and syringes in a special sharps container. Do not put them in a trash can. If you do not have a sharps container, call your pharmacist or healthcare provider to get one. A special MedGuide will be given to you by the pharmacist with each prescription and refill. Be sure to read this information carefully each time. Talk to your pediatrician regarding the use of this medicine in children. Special care may be needed. Overdosage: If you think you have taken too much of this medicine contact a poison control center or emergency room at  once. NOTE: This medicine is only for you. Do not share this medicine with others. What if I miss a dose? It is important not to miss your dose. Call your doctor of health care professional if you are unable to keep an appointment. If you give yourself the medicine and you miss a dose, take it as soon as you can. If it is almost time for your next dose, take only that dose. Do not take double or extra doses. What may interact with this medicine? Do not take this medicine with any of the following medications: -live virus vaccines This medicine may also interact with the following medications: -cyclosporine -inactivated vaccines -warfarin This list may not describe all possible interactions. Give your health care provider a list of all the medicines, herbs, non-prescription drugs, or dietary supplements you use. Also tell them if you smoke, drink alcohol, or use illegal drugs. Some items may interact with your medicine. What should I watch for while using this medicine? Tell your doctor or healthcare professional if your symptoms do not start to get better or if they get worse. You will be tested for tuberculosis (TB) before you start this medicine. If your doctor prescribes any medicine for TB, you should start taking the TB medicine before starting this medicine. Make sure to finish the full course of TB medicine. Call your doctor or healthcare professional for advice if you get a fever, chills or sore throat, or other symptoms of a cold or flu. Do not treat yourself. This drug decreases your body's ability to fight infections. Try to avoid being around people who are sick. This medicine can decrease   the response to a vaccine. If you need to get vaccinated, tell your healthcare professional if you have received this medicine within the last 6 months. Extra booster doses may be needed. Talk to your doctor to see if a different vaccination schedule is needed. What side effects may I notice from  receiving this medicine? Side effects that you should report to your doctor or health care professional as soon as possible: -allergic reactions like skin rash, itching or hives, swelling of the face, lips, or tongue -signs and symptoms of infection like fever or chills; cough; sore throat; pain or trouble passing urine Side effects that usually do not require medical attention (report to your doctor or health care professional if they continue or are bothersome): -diarrhea This list may not describe all possible side effects. Call your doctor for medical advice about side effects. You may report side effects to FDA at 1-800-FDA-1088. Where should I keep my medicine? Keep out of the reach of children. Store the prefilled syringe or injection pen in a refrigerator between 2 to 8 degrees C (36 to 46 degrees F). Keep the syringe or the pen in the original carton until ready for use. Protect from light. Do not freeze. Do not shake. Prior to use, remove the syringe or pen from the refrigerator and use within 1 hour. Throw away any unused medicine after the expiration date on the label. NOTE: This sheet is a summary. It may not cover all possible information. If you have questions about this medicine, talk to your doctor, pharmacist, or health care provider.  2018 Elsevier/Gold Standard (2015-11-29 11:48:31)   Association of heart disease with psoriatic arthritis was discussed. Need to monitor blood pressure, cholesterol, and to exercise 30-60 minutes on daily basis was discussed.

## 2018-07-28 LAB — PROTEIN ELECTROPHORESIS, SERUM, WITH REFLEX
ALBUMIN ELP: 3.9 g/dL (ref 3.8–4.8)
ALPHA 2: 0.7 g/dL (ref 0.5–0.9)
Alpha 1: 0.3 g/dL (ref 0.2–0.3)
BETA GLOBULIN: 0.6 g/dL (ref 0.4–0.6)
Beta 2: 0.6 g/dL — ABNORMAL HIGH (ref 0.2–0.5)
Gamma Globulin: 1.8 g/dL — ABNORMAL HIGH (ref 0.8–1.7)
TOTAL PROTEIN: 7.9 g/dL (ref 6.1–8.1)

## 2018-07-28 LAB — QUANTIFERON-TB GOLD PLUS
Mitogen-NIL: >10 IU/mL
NIL: 0.04 IU/mL
QuantiFERON-TB Gold Plus: NEGATIVE
TB1-NIL: <0 IU/mL
TB2-NIL: 0 IU/mL

## 2018-07-28 LAB — IGG, IGA, IGM
IGM, SERUM: 161 mg/dL (ref 50–300)
IgG (Immunoglobin G), Serum: 1755 mg/dL — ABNORMAL HIGH (ref 600–1640)
Immunoglobulin A: 611 mg/dL — ABNORMAL HIGH (ref 47–310)

## 2018-07-28 LAB — HIV ANTIBODY (ROUTINE TESTING W REFLEX): HIV: NONREACTIVE

## 2018-07-28 NOTE — Progress Notes (Signed)
TB gold negative. HIV and Hep panel negative.  SPEP did not reveal abnormal proteins.  Ok to apply/start on Cosentyx.

## 2018-08-10 ENCOUNTER — Telehealth: Payer: Self-pay | Admitting: Pharmacy Technician

## 2018-08-10 NOTE — Telephone Encounter (Signed)
Ran eligibility check, no active plans found. Called patient, spoke to [place], wife. He has not received new card in mail. Will call once he receives or if he can get information.  Will run eligibility check again tomorrow.  9:59 AM Dorthula Nettles, CPhT

## 2018-08-11 NOTE — Telephone Encounter (Signed)
No active plans found during eligibility check.

## 2018-08-12 ENCOUNTER — Telehealth: Payer: Self-pay | Admitting: Pharmacy Technician

## 2018-08-12 NOTE — Telephone Encounter (Signed)
Received a Prior Authorization request from [employer] for Cosentyx 300mg . Authorization has been submitted to patient's insurance via Cover My Meds. Will update once we receive a response.  3:43 PM Dorthula Nettles, CPhT

## 2018-08-12 NOTE — Telephone Encounter (Signed)
No active plans found during eligibility check. 

## 2018-08-12 NOTE — Telephone Encounter (Signed)
Patient's spouse called in to give new insurance information to start Cosentyx prior authorization process.  She went on to say that the patient's Remicade infusion would have been due tomorrow- 08/13/18, and he says he needs something in the interim. She said they are out of methotrexate refills. Requested a call back.

## 2018-08-13 ENCOUNTER — Other Ambulatory Visit: Payer: Self-pay | Admitting: Pharmacist

## 2018-08-13 ENCOUNTER — Telehealth: Payer: Self-pay | Admitting: Pharmacy Technician

## 2018-08-13 ENCOUNTER — Telehealth: Payer: Self-pay | Admitting: Rheumatology

## 2018-08-13 DIAGNOSIS — L4050 Arthropathic psoriasis, unspecified: Secondary | ICD-10-CM

## 2018-08-13 MED ORDER — SECUKINUMAB 150 MG/ML ~~LOC~~ SOAJ: 300.0000 mg | SUBCUTANEOUS | 0 refills | Status: DC

## 2018-08-13 MED ORDER — METHOTREXATE SODIUM CHEMO INJECTION 50 MG/2ML: INTRAMUSCULAR | 0 refills | Status: DC

## 2018-08-13 NOTE — Telephone Encounter (Signed)
Returned wife's call, advised her of PA approval and scheduled nurse visit for Cosentyx on 07/19/18 @ 9am. Confirmed new prescription for methotrexate was sent in to pharmacy- patient's dosage is the same.  1:49 PM Dorthula Nettles, CPhT

## 2018-08-13 NOTE — Telephone Encounter (Signed)
Attempted to contact the patient and left message for patient to call the office .  Last Visit: 07/23/18 Next visit: 10/22/18 Labs: 06/29/18 chronic stable mild anemia, chemistry panel reveals chronic stable mildly low calcium level, otherwise blood work is unremarkable  Okay to refill MTX per Dr. Barron Schmid

## 2018-08-13 NOTE — Progress Notes (Signed)
Prior Authorization approved for Cosentyx.  All lab work is up to date and new start visit scheduled for 08/18/18.  Prescription sent to [facility] for loading dose.

## 2018-08-13 NOTE — Telephone Encounter (Signed)
Received a fax from [employer] regarding a prior authorization for Cosentyx. Authorization has been APPROVED from 08/13/18 to 08/14/19.  Called plan, Per Rep. Arla C. Authorization includes loading doses.   Will send document to scan center.  Ref/Authorization # [id number] Phone # [phone]  Per Plan, patient must use [facility] pharmacy. Please send in new prescription. Patient is eligible to use Cosentyx copay card.  8:54 AM Dorthula Nettles, CPhT

## 2018-08-13 NOTE — Telephone Encounter (Signed)
Patient's wife Shanda Bumps called stating she was returning your call.

## 2018-08-13 NOTE — Telephone Encounter (Signed)
Spoke with patient's wife and advised her that the prescription for MTX has been sent to the pharmacy. Advised her that the Cosentyx has been approved and the prescription will need to be sent to a specialty pharmacy [facility] due to their new insurance. Advised that the medication will be shipped to their home and that I will apeak with Amber the pharmacist about getting the first injection set up for next week with a sample.

## 2018-08-13 NOTE — Telephone Encounter (Signed)
Wife had a question about Remicade billing for date of service in July 2019. Patient received a bill for $504.19. States that they have never received a bill.  Called [facility], per Rep Bradly Chris, she stated that the EOB shows the plan did not pay the administration fees (codes- [address] and [phone]) due to patient's deductible. She went on to state that [place] only pays for out of pocket for medication, and medication was paid in full by the Plan.   [employer], spoke to Rep, who stated the same, the remainder was due to patient's deductible. Total $504.19.  Called patient, left message to advise.  Ref# [id number] Phone# [phone]  10:08 AM Dorthula Nettles, CPhT

## 2018-08-16 NOTE — Telephone Encounter (Signed)
Received fax from [employer] with information to apply for copay card. Spoke to wife and she has been working with [employer] rep to get patient signed up. She will call office if she needs any other assistance.  1:39 PM Dorthula Nettles, CPhT

## 2018-08-16 NOTE — Progress Notes (Signed)
Pharmacy Note  Subjective:  Patient presents today with caregiver to the [facility] to see Dr. Corliss Skains.  Patient was seen by the pharmacist for counseling on Cosentyx. Last Remicaide infusion was 7 weeks ago.  His current treatment includes injectable methotrexate.  Previously failed Enbrel.  Objective:  CBC    Component Value Date/Time   WBC 8.2 06/29/2018 1138   WBC 10.5 04/03/2017 1035   RBC 5.10 06/29/2018 1138   RBC 4.54 04/03/2017 1035   HGB 11.4 (L) 06/29/2018 1138   HGB 14.0 02/02/2015 0843   HCT 37.6 06/29/2018 1138   HCT 43.0 02/02/2015 0843   PLT 222 06/29/2018 1138   MCV 74 (L) 06/29/2018 1138   MCV 84.3 02/02/2015 0843   MCH 22.4 (L) 06/29/2018 1138   MCH 24.2 (L) 04/03/2017 1035   MCHC 30.3 (L) 06/29/2018 1138   MCHC 30.2 04/03/2017 1035   RDW 17.4 (H) 06/29/2018 1138   RDW 18.7 (H) 02/02/2015 0843   LYMPHSABS 3.3 (H) 06/29/2018 1138   LYMPHSABS 2.1 02/02/2015 0843   MONOABS 0.8 04/03/2017 1035   MONOABS 0.7 02/02/2015 0843   EOSABS 0.2 06/29/2018 1138   BASOSABS 0.0 06/29/2018 1138   BASOSABS 0.0 02/02/2015 0843    CMP     Component Value Date/Time   NA 139 06/29/2018 1138   NA 138 10/25/2014 1011   K 3.9 06/29/2018 1138   K 3.2 (L) 10/25/2014 1011   CL 102 06/29/2018 1138   CO2 23 06/29/2018 1138   CO2 24 10/25/2014 1011   GLUCOSE 92 06/29/2018 1138   GLUCOSE 98 04/03/2017 1035   GLUCOSE 103 10/25/2014 1011   BUN 9 06/29/2018 1138   BUN 13.4 10/25/2014 1011   CREATININE 1.00 06/29/2018 1138   CREATININE 1.0 10/25/2014 1011   CALCIUM 8.5 (L) 06/29/2018 1138   CALCIUM 8.5 10/25/2014 1011   PROT 7.9 07/23/2018 0923   PROT 7.3 06/29/2018 1138   PROT 7.0 10/25/2014 1011   ALBUMIN 3.8 06/29/2018 1138   ALBUMIN 3.7 10/25/2014 1011   AST 21 06/29/2018 1138   AST 18 10/25/2014 1011   ALT 23 06/29/2018 1138   ALT 16 10/25/2014 1011   ALKPHOS 97 06/29/2018 1138   ALKPHOS 83 10/25/2014 1011   BILITOT 0.4 06/29/2018 1138   BILITOT  0.43 10/25/2014 1011   GFRNONAA 97 06/29/2018 1138   GFRAA 112 06/29/2018 1138    TB Gold: negative 07/23/18 Hepatitis panel: negative 01/31/09 HIV: negative 07/23/18 SPEP: within normal limits 07/23/18 Immunoglobulin: within normal limits 07/23/18  Does patient have a history of inflammatory bowel disease? No  Assessment/Plan:  Counseled patient that Cosentyx is a IL-17 inhibitor that works to reduce pain and inflammation associated with arthritis.  Counseled patient on purpose, proper use, and adverse effects of Cosentyx. Reviewed the most common adverse effects of infection, inflammatory bowel disease, and allergic reaction.    Reviewed the importance of regular labs while on Cosentyx.  Instructed patient to return in 1 month and then every 3 months for routine lab monitoring.  Standing orders are in place.  Counseled patient that Cosentyx should be held prior to scheduled surgery.    Counseled patient to avoid live vaccines while on Cosentyx.  Advised patient to get annual influenza vaccine and the pneumococcal vaccine as indicated.  Based on our records he is due for another Pneumovax vaccine. Instructed patient to follow-up with PCP about Pneumovax and hepatitis B vaccinations.    Counseled patient on proper storage and administration of  injection.  Counseled on signs and symptoms of allergic reaction and localized reaction.  Instructed patient to take antihistamine day before, day of, and day after injection if localized reaction occurs.  Demonstrated proper injection technique with Cosentyx demo pen.  Patient prefers caregiver administer injection. Patient and caregiver able to demonstrate proper injection technique using the teach back method. Patient caregiver injected in the lower right and lower left abdomen with:  Sample Medication: Cosentyx 150mg  injection- 2 pens to equal 300mg  NDC: 1610-9604-54 Lot: [id number] Expiration: 05/2019    Patient tolerated well.  Observed for 30 mins  in office for adverse reaction and no allergic or localized reaction noted. Instructed patient to call with any questions/issues.    Verlin Fester, PharmD, [facility] Clinical Pharmacist  08/18/2018 11:20 AM

## 2018-08-18 ENCOUNTER — Ambulatory Visit (INDEPENDENT_AMBULATORY_CARE_PROVIDER_SITE_OTHER): Payer: Managed Care, Other (non HMO) | Admitting: Pharmacist

## 2018-08-18 VITALS — BP 125/82 | HR 74

## 2018-08-18 DIAGNOSIS — Z79899 Other long term (current) drug therapy: Secondary | ICD-10-CM

## 2018-08-18 DIAGNOSIS — L4050 Arthropathic psoriasis, unspecified: Secondary | ICD-10-CM

## 2018-08-18 MED ORDER — SECUKINUMAB 150 MG/ML ~~LOC~~ SOSY
300.0000 mg | PREFILLED_SYRINGE | Freq: Once | SUBCUTANEOUS | Status: AC
  Administered 2018-08-18: 300 mg via SUBCUTANEOUS

## 2018-08-18 NOTE — Telephone Encounter (Signed)
Received a fax from [facility] requesting a copy of the patient's insurance card. Faxed requested documents.  Phone- [phone] Fax- [phone]  9:30 AM Dorthula Nettles, CPhT

## 2018-08-18 NOTE — Patient Instructions (Addendum)
Cosentyx 300mg  (2 injections) 0,1,2,3,4 and then every 4 weeks. Your Cosentyx injection day is Wednesday but you may switch injection day to Friday for ease of scheduling . Standing Labs We placed an order today for your standing lab work.    Please come back and get your standing labs in 1 month and then every 3 months.  We have open lab Monday through Friday from 8:30-11:30 AM and 1:30-4:00 PM  at the office of Dr. Pollyann Savoy.   You may experience shorter wait times on Monday and Friday afternoons. The office is located at [address], [place], [place] [address] No appointment is necessary.   Labs are drawn by [facility].  You may receive a bill from [facility] for your lab work. If you have any questions regarding directions or hours of operation,  please call [phone].   Just as a reminder please drink plenty of water prior to coming for your lab work. Thanks!  Vaccines You are taking a medication(s) that can suppress your immune system.  The following immunizations are recommended:  . Flu annually . Pneumonia (Pneumovax 23) . Hepatitis B  Please check with your PCP to make sure you are up to date.

## 2018-09-20 ENCOUNTER — Telehealth: Payer: Self-pay | Admitting: Rheumatology

## 2018-09-20 MED ORDER — CLOBETASOL PROPIONATE 0.05 % EX CREA: TOPICAL_CREAM | Freq: Two times a day (BID) | CUTANEOUS | 1 refills | Status: DC | PRN

## 2018-09-20 NOTE — Telephone Encounter (Signed)
Last Visit : 07/23/18 Next Visit: 10/22/18  Okay to refill per Dr. Corliss Skains

## 2018-09-20 NOTE — Telephone Encounter (Signed)
Patient called requesting prescription refill of Clobetasol cream to be sent to [facility] in [place].

## 2018-10-11 NOTE — Progress Notes (Deleted)
Office Visit Note  Patient: Joseph [name]             Date of Birth: [birth year]           MRN: [id number]             PCP: Joette Catching, MD Referring: Joette Catching, MD Visit Date: 10/22/2018 Occupation: @GUAROCC @  Subjective:  No chief complaint on file.   History of Present Illness: Joseph [name] is a 37 y.o. male ***   Activities of Daily Living:  Patient reports morning stiffness for *** {minute/hour:19697}.   Patient {ACTIONS;DENIES/REPORTS:21021675::"Denies"} nocturnal pain.  Difficulty dressing/grooming: {ACTIONS;DENIES/REPORTS:21021675::"Denies"} Difficulty climbing stairs: {ACTIONS;DENIES/REPORTS:21021675::"Denies"} Difficulty getting out of chair: {ACTIONS;DENIES/REPORTS:21021675::"Denies"} Difficulty using hands for taps, buttons, cutlery, and/or writing: {ACTIONS;DENIES/REPORTS:21021675::"Denies"}  No Rheumatology ROS completed.   PMFS History:  Patient Active Problem List   Diagnosis Date Noted  . Varicose veins of both lower extremities 07/26/2017  . Acute sinus infection 11/07/2014  . Other acute sinusitis 11/07/2014  . Iron deficiency anemia 09/21/2014  . Psoriatic arthritis (HCC) 09/21/2014  . ESSENTIAL HYPERTENSION, MALIGNANT 02/18/2009  . Nonspecific (abnormal) findings on radiological and other examination of body structure 02/16/2009  . CHEST XRAY, ABNORMAL 02/16/2009    Past Medical History:  Diagnosis Date  . Acute sinus infection 11/07/2014  . Arthritis   . Blood transfusion without reported diagnosis    At [facility]   . Iron deficiency anemia 09/21/2014    Family History  Problem Relation Age of Onset  . Pulmonary fibrosis Mother    Past Surgical History:  Procedure Laterality Date  . COLONOSCOPY WITH ESOPHAGOGASTRODUODENOSCOPY (EGD)    . HERNIA REPAIR  1994  . 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   Physical Exam   Musculoskeletal Exam: ***  CDAI Exam: CDAI Score: Not documented Patient Global Assessment: Not documented; Provider Global Assessment: Not documented Swollen: Not documented; Tender: Not documented Joint Exam   Not documented   There is currently no information documented on the homunculus. Go to the Rheumatology activity and complete the homunculus joint exam.  Investigation: No additional findings.  Imaging: No results found.  Recent Labs: Lab Results  Component Value Date   WBC 8.2 06/29/2018   HGB 11.4 (L) 06/29/2018   PLT 222 06/29/2018   NA 139 06/29/2018   K 3.9 06/29/2018   CL 102 06/29/2018   CO2 23 06/29/2018   GLUCOSE 92 06/29/2018   BUN 9 06/29/2018   CREATININE 1.00 06/29/2018   BILITOT 0.4 06/29/2018   ALKPHOS 97 06/29/2018   AST 21 06/29/2018   ALT 23 06/29/2018   PROT 7.9 07/23/2018   ALBUMIN 3.8 06/29/2018   CALCIUM 8.5 (L) 06/29/2018   GFRAA 112 06/29/2018   QFTBGOLD Negative 04/03/2017   QFTBGOLDPLUS NEGATIVE 07/23/2018    Speciality Comments: Remicade 5mg /kg every 6 weeks TB gold negative 5/25/18Infuses at [facility]  Procedures:  No procedures performed Allergies: Bee venom and Sulfonamide derivatives   Assessment / Plan:     Visit Diagnoses: Psoriatic arthritis (HCC)  Psoriasis  High risk medication use - Cosentyx, MTX 0.6 ml once weeklyinadequate response to Remicade  Primary osteoarthritis of both hands  Tendinopathy of right shoulder  History of iron deficiency anemia  History of hypertension  Varicose veins of both lower extremities, unspecified whether complicated  Iron deficiency anemia   Orders: No orders of the defined types were placed in this encounter.  No orders of the defined types were placed in this encounter.   Face-to-face time spent with patient was *** minutes. Greater than 50% of time was spent in  counseling and coordination of care.  Follow-Up Instructions: No follow-ups on file.   Gearldine Bienenstockaylor M Cordai Rodrigue, PA-C  Note - This record has been created using Dragon software.  Chart creation errors have been sought, but may not always  have been located. Such creation errors do not reflect on  the standard of medical care.

## 2018-10-15 ENCOUNTER — Other Ambulatory Visit: Payer: Self-pay

## 2018-10-15 DIAGNOSIS — Z79899 Other long term (current) drug therapy: Secondary | ICD-10-CM

## 2018-10-18 LAB — CBC WITH DIFFERENTIAL/PLATELET
BASOS ABS: 61 {cells}/uL (ref 0–200)
BASOS PCT: 0.6 %
EOS PCT: 3.2 %
Eosinophils Absolute: 326 cells/uL (ref 15–500)
HEMATOCRIT: 37 % — AB (ref 38.5–50.0)
Hemoglobin: 11.3 g/dL — ABNORMAL LOW (ref 13.2–17.1)
LYMPHS ABS: 2234 {cells}/uL (ref 850–3900)
MCH: 22.9 pg — ABNORMAL LOW (ref 27.0–33.0)
MCHC: 30.5 g/dL — ABNORMAL LOW (ref 32.0–36.0)
MCV: 74.9 fL — ABNORMAL LOW (ref 80.0–100.0)
MPV: 11.8 fL (ref 7.5–12.5)
Monocytes Relative: 7.1 %
Neutro Abs: 6854 cells/uL (ref 1500–7800)
Neutrophils Relative %: 67.2 %
Platelets: 239 10*3/uL (ref 140–400)
RBC: 4.94 10*6/uL (ref 4.20–5.80)
RDW: 18.1 % — ABNORMAL HIGH (ref 11.0–15.0)
Total Lymphocyte: 21.9 %
WBC mixed population: 724 cells/uL (ref 200–950)
WBC: 10.2 10*3/uL (ref 3.8–10.8)

## 2018-10-18 LAB — COMPLETE METABOLIC PANEL WITH GFR
AG Ratio: 0.9 (calc) — ABNORMAL LOW (ref 1.0–2.5)
ALKALINE PHOSPHATASE (APISO): 91 U/L (ref 40–115)
ALT: 15 U/L (ref 9–46)
AST: 15 U/L (ref 10–40)
Albumin: 3.4 g/dL — ABNORMAL LOW (ref 3.6–5.1)
BUN: 11 mg/dL (ref 7–25)
CO2: 23 mmol/L (ref 20–32)
Calcium: 8.2 mg/dL — ABNORMAL LOW (ref 8.6–10.3)
Chloride: 103 mmol/L (ref 98–110)
Creat: 1.06 mg/dL (ref 0.60–1.35)
GFR, EST AFRICAN AMERICAN: 105 mL/min/{1.73_m2} (ref >=60)
GFR, Est Non African American: 90 mL/min/{1.73_m2} (ref >=60)
GLUCOSE: 140 mg/dL — AB (ref 65–99)
Globulin: 3.6 g/dL (calc) (ref 1.9–3.7)
Potassium: 3.2 mmol/L — ABNORMAL LOW (ref 3.5–5.3)
Sodium: 137 mmol/L (ref 135–146)
TOTAL PROTEIN: 7 g/dL (ref 6.1–8.1)
Total Bilirubin: 0.7 mg/dL (ref 0.2–1.2)

## 2018-10-18 LAB — QUANTIFERON-TB GOLD PLUS
Mitogen-NIL: 7.74 IU/mL
NIL: 0.02 IU/mL
QUANTIFERON-TB GOLD PLUS: NEGATIVE
TB1-NIL: 0.01 [IU]/mL
TB2-NIL: 0 [IU]/mL

## 2018-10-18 NOTE — Progress Notes (Signed)
Glucose is elevated-140.  Potassium is borderline low and calcium is low. Please notify patient and forward to PCP.

## 2018-10-18 NOTE — Progress Notes (Signed)
Chronic anemia, stable.

## 2018-10-18 NOTE — Progress Notes (Signed)
TB gold negative

## 2018-10-22 ENCOUNTER — Ambulatory Visit: Payer: BLUE CROSS/BLUE SHIELD | Admitting: Physician Assistant

## 2018-10-22 ENCOUNTER — Other Ambulatory Visit: Payer: Self-pay | Admitting: *Deleted

## 2018-10-22 MED ORDER — SECUKINUMAB (300 MG DOSE) 150 MG/ML ~~LOC~~ SOAJ: 300.0000 mg | SUBCUTANEOUS | 0 refills | Status: DC

## 2018-10-22 NOTE — Progress Notes (Signed)
Office Visit Note  Patient: Joseph [name]             Date of Birth: [birth year]           MRN: [id number]             PCP: Joseph Catching, MD Referring: Joseph Catching, MD Visit Date: 11/05/2018 Occupation: @GUAROCC @  Subjective:  Medication monitoring   History of Present Illness: Joseph [name] is a 37 y.o. male with history of psoriatic arthritis and osteoarthritis.  He was started on Cosentyx on 08/18/2018.  He has noticed improvement since starting on Cosentyx.  He has less pain in both hands.  He denies any joint swelling at this time.  He states since starting on Cosentyx he has had a few more patches of psoriasis than he did while on Remicade.  He denies any SI joint pain.  He denies any achilles tendonitis or plantar fasciitis.  He reports overall his feet have not been hurting him but at times when he works 12+ days and is wearing steel toe boots he will notice swelling in both feet.   Activities of Daily Living:  Patient reports morning stiffness for 0 minutes.   Patient Denies nocturnal pain.  Difficulty dressing/grooming: Denies Difficulty climbing stairs: Denies Difficulty getting out of chair: Denies Difficulty using hands for taps, buttons, cutlery, and/or writing: Denies  Review of Systems  Constitutional: Negative for fatigue.  HENT: Negative for mouth sores, trouble swallowing, trouble swallowing and mouth dryness.   Eyes: Negative for pain, itching and dryness.  Respiratory: Negative for shortness of breath, wheezing and difficulty breathing.   Cardiovascular: Negative for chest pain, palpitations and swelling in legs/feet.  Gastrointestinal: Negative for abdominal pain, blood in stool, constipation, diarrhea, nausea and vomiting.  Endocrine: Negative for increased urination.  Genitourinary: Negative for painful urination, pelvic pain and urgency.  Musculoskeletal: Positive for joint swelling. Negative for arthralgias, joint pain and morning stiffness.    Skin: Negative for rash and hair loss.  Allergic/Immunologic: Negative for susceptible to infections.  Neurological: Negative for dizziness, light-headedness, headaches, memory loss and weakness.  Psychiatric/Behavioral: Negative for confusion. The patient is not nervous/anxious.     PMFS History:  Patient Active Problem List   Diagnosis Date Noted  . Varicose veins of both lower extremities 07/26/2017  . Acute sinus infection 11/07/2014  . Other acute sinusitis 11/07/2014  . Iron deficiency anemia 09/21/2014  . Psoriatic arthritis (HCC) 09/21/2014  . ESSENTIAL HYPERTENSION, MALIGNANT 02/18/2009  . Nonspecific (abnormal) findings on radiological and other examination of body structure 02/16/2009  . CHEST XRAY, ABNORMAL 02/16/2009    Past Medical History:  Diagnosis Date  . Acute sinus infection 11/07/2014  . Arthritis   . Blood transfusion without reported diagnosis    At [facility]   . Iron deficiency anemia 09/21/2014    Family History  Problem Relation Age of Onset  . Pulmonary fibrosis Mother   . Pancreatic cancer Maternal Grandmother    Past Surgical History:  Procedure Laterality Date  . COLONOSCOPY WITH ESOPHAGOGASTRODUODENOSCOPY (EGD)    . HERNIA REPAIR  1994  . 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  Constitutional:      Appearance: [name] well-developed.  HENT:     Head: Normocephalic and atraumatic.  Eyes:     Conjunctiva/sclera: Conjunctivae normal.     Pupils: Pupils are equal, round, and reactive to light.  Neck:     Musculoskeletal: Normal range of motion and  neck supple.  Cardiovascular:     Rate and Rhythm: Normal rate and regular rhythm.     Heart sounds: Normal heart sounds.  Pulmonary:     Effort: Pulmonary effort is normal.     Breath sounds: Normal breath sounds.  Abdominal:     General: Bowel sounds are normal.     Palpations: Abdomen is soft.  Lymphadenopathy:     Cervical: No cervical adenopathy.  Skin:    General: Skin is warm and dry.     Capillary Refill: Capillary refill takes less than 2 seconds.     Comments: Scattered patches of psoriasis.  No nail pitting or nail dystrophy noted.   Neurological:     Mental Status: [name] alert and oriented to person, place, and time.  Psychiatric:        Behavior: Behavior normal.      Musculoskeletal Exam: C-spine, thoracic spine, and lumbar spine good ROM.  No midline spinal tenderness.  No SI joint tenderness.  Shoulder joints, elbow joints, wrist joints, MCPs, PIPs, and DIPs good ROM with no synovitis. Complete fist formation bilaterally.  Hip joints, knee joints, ankle joints, MTPs, PIPs, and DIPs good ROM with no synovitis.  No warmth or effusion of knee joints.  No tenderness or swelling of ankle joints.   CDAI Exam: CDAI Score: Not documented Patient Global Assessment: Not documented; Provider Global Assessment: Not documented Swollen: Not documented; Tender: Not documented Joint Exam   Not documented   There is currently no information documented on the homunculus. Go to the Rheumatology activity and complete the homunculus joint exam.  Investigation: No additional findings.  Imaging: No results found.  Recent Labs: Lab Results  Component Value Date   WBC 10.2 10/15/2018   HGB 11.3 (L) 10/15/2018   PLT 239 10/15/2018   NA 137 10/15/2018   K 3.2 (L) 10/15/2018   CL 103 10/15/2018   CO2 23 10/15/2018   GLUCOSE 140 (H) 10/15/2018   BUN 11 10/15/2018   CREATININE 1.06 10/15/2018   BILITOT 0.7 10/15/2018   ALKPHOS 97 06/29/2018   AST 15 10/15/2018   ALT 15  10/15/2018   PROT 7.0 10/15/2018   ALBUMIN 3.8 06/29/2018   CALCIUM 8.2 (L) 10/15/2018   GFRAA 105 10/15/2018   QFTBGOLD Negative 04/03/2017   QFTBGOLDPLUS NEGATIVE 10/15/2018    Speciality Comments: Remicade 5mg /kg every 6 weeks TB gold negative 04/03/17 Infuses at [facility]  Procedures:  No procedures performed Allergies: Bee venom and Sulfonamide derivatives   Assessment / Plan:     Visit Diagnoses: Psoriatic arthritis (HCC): He has no synovitis or dactylitis on exam.  He was started on Cosentyx on 08/18/2018.  He has noticed significant improvement since switching from Remicade to Cosentyx.  He does not have any joint pain or joint swelling at this time.  He does not have any morning stiffness.  He has a few scattered patches of psoriasis which have flared since transitioning to Cosentyx.  He has no SI joint tenderness  on exam.  He has no Achilles tenderness or plantar fasciitis.  He will continue on Cosentyx subcu injections once monthly and methotrexate 0.6 mL subcutaneous injections once weekly.  A refill of folic acid was sent to the pharmacy today.  He was advised to notify us if develops increased joint pain or joint swelling.  He will follow-up in the office in 5 months.  Psoriasis: He has a few scattered patches of psoriasis.  He continues to use topical clobetasol PRN. He does not need a refill at this time. No nail pitting noted.   High risk medication use - Cosentyx, MTX 0.6 ml once weekly (inadequate response to Enbrel, Remicade). CBC and CMP were drawn on 10/15/18.  He will return in March and every 3 months for lab work to monitor for drug toxicity. TB gold negative 10/15/18.  Primary osteoarthritis of both hands: He has complete fist formation bilaterally.  No synovitis.  Joint protection and muscle strengthening were discussed.   Tendinopathy of right shoulder: He has good ROM with no discomfort.   Other medical conditions are listed as follows:   History of iron deficiency  anemia  History of hypertension   Orders: No orders of the defined types were placed in this encounter.  Meds ordered this encounter  Medications  . folic acid (FOLVITE) 1 MG tablet    Sig: Take 1 tablet (1 mg total) by mouth daily.    Dispense:  90 tablet    Refill:  3      Follow-Up Instructions: Return in about 5 months (around 04/06/2019) for Psoriatic arthritis.   Gearldine Bienenstockaylor M , PA-C  Note - This record has been created using Dragon software.  Chart creation errors have been sought, but may not always  have been located. Such creation errors do not reflect on  the standard of medical care.

## 2018-10-22 NOTE — Telephone Encounter (Signed)
Refill request received via fax  Last visit: 07/23/18 Next visit: 11/05/18 Labs: 10/15/18 Glucose is elevated-140. Potassium is borderline low and calcium is low TB Gold: 10/13/18 Neg   Okay to refill per Dr. Corliss Skainseveshwar

## 2018-11-05 ENCOUNTER — Ambulatory Visit (INDEPENDENT_AMBULATORY_CARE_PROVIDER_SITE_OTHER): Payer: Managed Care, Other (non HMO) | Admitting: Physician Assistant

## 2018-11-05 ENCOUNTER — Encounter: Payer: Self-pay | Admitting: Physician Assistant

## 2018-11-05 VITALS — BP 145/98 | HR 83 | Resp 15 | Ht 70.0 in | Wt 308.0 lb

## 2018-11-05 DIAGNOSIS — M19041 Primary osteoarthritis, right hand: Secondary | ICD-10-CM | POA: Diagnosis not present

## 2018-11-05 DIAGNOSIS — L4050 Arthropathic psoriasis, unspecified: Secondary | ICD-10-CM

## 2018-11-05 DIAGNOSIS — Z79899 Other long term (current) drug therapy: Secondary | ICD-10-CM

## 2018-11-05 DIAGNOSIS — Z8679 Personal history of other diseases of the circulatory system: Secondary | ICD-10-CM

## 2018-11-05 DIAGNOSIS — M19042 Primary osteoarthritis, left hand: Secondary | ICD-10-CM

## 2018-11-05 DIAGNOSIS — L409 Psoriasis, unspecified: Secondary | ICD-10-CM

## 2018-11-05 DIAGNOSIS — Z862 Personal history of diseases of the blood and blood-forming organs and certain disorders involving the immune mechanism: Secondary | ICD-10-CM

## 2018-11-05 DIAGNOSIS — M67911 Unspecified disorder of synovium and tendon, right shoulder: Secondary | ICD-10-CM

## 2018-11-05 MED ORDER — FOLIC ACID 1 MG PO TABS: 1.0000 mg | ORAL_TABLET | Freq: Every day | ORAL | 3 refills | Status: DC

## 2018-11-05 NOTE — Patient Instructions (Signed)
Standing Labs We placed an order today for your standing lab work.    Please come back and get your standing labs in March and every 3 months  We have open lab Monday through Friday from 8:30-11:30 AM and 1:30-4:00 PM  at the office of Dr. Shaili Deveshwar.   You may experience shorter wait times on Monday and Friday afternoons. The office is located at [address], [address], [place], [place] [address] No appointment is necessary.   Labs are drawn by [facility].  You may receive a bill from [facility] for your lab work.  If you wish to have your labs drawn at another location, please call the office 24 hours in advance to send orders.  If you have any questions regarding directions or hours of operation,  please call [phone].   Just as a reminder please drink plenty of water prior to coming for your lab work. Thanks!  

## 2019-01-18 ENCOUNTER — Telehealth: Payer: Self-pay | Admitting: Rheumatology

## 2019-01-18 MED ORDER — METHOTREXATE SODIUM CHEMO INJECTION 50 MG/2ML: INTRAMUSCULAR | 0 refills | Status: DC

## 2019-01-18 NOTE — Telephone Encounter (Signed)
Last Visit: 11/05/18 Next visit: 04/08/19 Labs: 10/15/18 Glucose is elevated-140. Potassium is borderline low and calcium is low.   Left message to advise patient he is due to update labs  Okay to refill 30 day supply per Dr. Corliss Skains

## 2019-01-18 NOTE — Telephone Encounter (Signed)
Patient called requesting prescription refill of Methotrexate sent to [facility] at [address].

## 2019-02-08 ENCOUNTER — Other Ambulatory Visit: Payer: Self-pay | Admitting: *Deleted

## 2019-02-08 MED ORDER — SECUKINUMAB (300 MG DOSE) 150 MG/ML ~~LOC~~ SOAJ: 300.0000 mg | SUBCUTANEOUS | 0 refills | Status: DC

## 2019-02-08 MED ORDER — METHOTREXATE SODIUM CHEMO INJECTION 50 MG/2ML: INTRAMUSCULAR | 0 refills | Status: DC

## 2019-02-08 NOTE — Telephone Encounter (Signed)
Refill request received via fax  Last Visit: 11/05/18 Next visit: 04/08/19 Labs: 10/15/18 Glucose is elevated-140. Potassium is borderline low and calcium is low.  TB gold: 07/23/18 Neg   Okay to refill per Dr. Corliss Skains

## 2019-02-28 ENCOUNTER — Telehealth: Payer: Self-pay | Admitting: Rheumatology

## 2019-02-28 DIAGNOSIS — Z79899 Other long term (current) drug therapy: Secondary | ICD-10-CM

## 2019-02-28 NOTE — Telephone Encounter (Signed)
Lab orders have been released for [facility].  

## 2019-02-28 NOTE — Telephone Encounter (Signed)
Patient going to [facility] in [place]/ [facility] on Friday. Please release orders.

## 2019-03-12 LAB — CMP14+EGFR
ALT: 16 IU/L (ref 0–44)
AST: 14 IU/L (ref 0–40)
Albumin/Globulin Ratio: 1.2 (ref 1.2–2.2)
Albumin: 3.8 g/dL — ABNORMAL LOW (ref 4.0–5.0)
Alkaline Phosphatase: 104 IU/L (ref 39–117)
BUN/Creatinine Ratio: 10 (ref 9–20)
BUN: 10 mg/dL (ref 6–20)
Bilirubin Total: 0.4 mg/dL (ref 0.0–1.2)
CO2: 21 mmol/L (ref 20–29)
Calcium: 8.2 mg/dL — ABNORMAL LOW (ref 8.7–10.2)
Chloride: 102 mmol/L (ref 96–106)
Creatinine, Ser: 1 mg/dL (ref 0.76–1.27)
GFR calc Af Amer: 112 mL/min/{1.73_m2} (ref >59)
GFR calc non Af Amer: 97 mL/min/{1.73_m2} (ref >59)
Globulin, Total: 3.3 g/dL (ref 1.5–4.5)
Glucose: 109 mg/dL — ABNORMAL HIGH (ref 65–99)
Potassium: 3.7 mmol/L (ref 3.5–5.2)
Sodium: 139 mmol/L (ref 134–144)
Total Protein: 7.1 g/dL (ref 6.0–8.5)

## 2019-03-12 LAB — CBC WITH DIFFERENTIAL/PLATELET
Basophils Absolute: 0.1 10*3/uL (ref 0.0–0.2)
Basos: 1 %
EOS (ABSOLUTE): 0.2 10*3/uL (ref 0.0–0.4)
Eos: 2 %
Hematocrit: 38.5 % (ref 37.5–51.0)
Hemoglobin: 11.8 g/dL — ABNORMAL LOW (ref 13.0–17.7)
Immature Grans (Abs): 0.1 10*3/uL (ref 0.0–0.1)
Immature Granulocytes: 1 %
Lymphocytes Absolute: 2.5 10*3/uL (ref 0.7–3.1)
Lymphs: 28 %
MCH: 24.1 pg — ABNORMAL LOW (ref 26.6–33.0)
MCHC: 30.6 g/dL — ABNORMAL LOW (ref 31.5–35.7)
MCV: 79 fL (ref 79–97)
Monocytes Absolute: 0.7 10*3/uL (ref 0.1–0.9)
Monocytes: 7 %
Neutrophils Absolute: 5.4 10*3/uL (ref 1.4–7.0)
Neutrophils: 61 %
Platelets: 202 10*3/uL (ref 150–450)
RBC: 4.9 x10E6/uL (ref 4.14–5.80)
RDW: 17.2 % — ABNORMAL HIGH (ref 11.6–15.4)
WBC: 8.8 10*3/uL (ref 3.4–10.8)

## 2019-03-14 NOTE — Telephone Encounter (Signed)
stable °

## 2019-03-25 NOTE — Progress Notes (Deleted)
Office Visit Note  Patient: Joseph [name]             Date of Birth: [birth year]           MRN: [id number]             PCP: Dione Housekeeper, MD Referring: Dione Housekeeper, MD Visit Date: 04/08/2019 Occupation: _0 @  Subjective:  No chief complaint on file.  [name] on Cosentyx 300 mg every 28 days (started in October 2019), methotrexate 0.6 mL's every 7 days, and folic acid 1 mg daily.  Last TB gold negative on 10/15/2018 and will monitor yearly.  Most recent CBC/CMP stable on 03/11/2019 and will monitor every 3 months.  Standing orders are in place.  He has received Pneumovax 23 vaccine.  Recommend annual flu and Prevnar 13 as indicated.  History of Present Illness: Joseph [name] is a 38 y.o. male ***   Activities of Daily Living:  Patient reports morning stiffness for *** {minute/hour:19697}.   Patient {ACTIONS;DENIES/REPORTS:21021675::"Denies"} nocturnal pain.  Difficulty dressing/grooming: {ACTIONS;DENIES/REPORTS:21021675::"Denies"} Difficulty climbing stairs: {ACTIONS;DENIES/REPORTS:21021675::"Denies"} Difficulty getting out of chair: {ACTIONS;DENIES/REPORTS:21021675::"Denies"} Difficulty using hands for taps, buttons, cutlery, and/or writing: {ACTIONS;DENIES/REPORTS:21021675::"Denies"}  No Rheumatology ROS completed.   PMFS History:  Patient Active Problem List   Diagnosis Date Noted  . Varicose veins of both lower extremities 07/26/2017  . Acute sinus infection 11/07/2014  . Other acute sinusitis 11/07/2014  . Iron deficiency anemia 09/21/2014  . Psoriatic arthritis ([place]) 09/21/2014  . ESSENTIAL HYPERTENSION, MALIGNANT 02/18/2009  . Nonspecific (abnormal) findings on radiological and other examination of body structure 02/16/2009  . CHEST XRAY, ABNORMAL 02/16/2009    Past Medical History:  Diagnosis Date  . Acute sinus infection 11/07/2014  . Arthritis   . Blood transfusion without reported diagnosis    At [facility]   . Iron deficiency anemia 09/21/2014     Family History  Problem Relation Age of Onset  . Pulmonary fibrosis Mother   . Pancreatic cancer Maternal Grandmother    Past Surgical History:  Procedure Laterality Date  . COLONOSCOPY WITH ESOPHAGOGASTRODUODENOSCOPY (EGD)    . HERNIA REPAIR  1994  . 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 MMR 02/12/2001  . Pneumococcal Polysaccharide-23 11/10/2012  . Td 08/17/2001     Objective: Vital Signs: There were no vitals taken for this visit.   Physical Exam   Musculoskeletal Exam: ***  CDAI Exam: CDAI Score: Not documented Patient Global Assessment: Not documented; Provider Global Assessment: Not documented Swollen: Not documented; Tender: Not documented Joint Exam   Not documented   There is currently no information documented on the homunculus. Go to the Rheumatology activity and complete the homunculus joint exam.  Investigation: No additional findings.  Imaging: No results found.  Recent Labs: Lab Results  Component Value Date   WBC 8.8 03/11/2019   HGB 11.8 (L) 03/11/2019   PLT 202 03/11/2019   NA 139 03/11/2019   K 3.7 03/11/2019   CL 102 03/11/2019   CO2 21 03/11/2019   GLUCOSE 109 (H) 03/11/2019   BUN 10 03/11/2019   CREATININE 1.00 03/11/2019   BILITOT 0.4 03/11/2019   ALKPHOS 104 03/11/2019   AST 14 03/11/2019   ALT 16 03/11/2019   PROT 7.1 03/11/2019   ALBUMIN 3.8 (L)  03/11/2019   CALCIUM 8.2 (L) 03/11/2019   GFRAA 112 03/11/2019   QFTBGOLD Negative 04/03/2017   QFTBGOLDPLUS NEGATIVE 10/15/2018    Speciality Comments: Remicade 55m/kg every 6 weeks TB gold negative 5/25/18Infuses at [facility]  Procedures:  No procedures performed Allergies: Bee venom and Sulfonamide derivatives   Assessment / Plan:     Visit Diagnoses: No diagnosis found.   Orders:  No orders of the defined types were placed in this encounter.  No orders of the defined types were placed in this encounter.   Face-to-face time spent with patient was *** minutes. Greater than 50% of time was spent in counseling and coordination of care.  Follow-Up Instructions: No follow-ups on file.   TOfilia Neas PA-C  Note - This record has been created using Dragon software.  Chart creation errors have been sought, but may not always  have been located. Such creation errors do not reflect on  the standard of medical care.

## 2019-04-08 ENCOUNTER — Ambulatory Visit: Payer: Self-pay | Admitting: Physician Assistant

## 2019-05-17 ENCOUNTER — Other Ambulatory Visit: Payer: Self-pay | Admitting: *Deleted

## 2019-05-17 MED ORDER — COSENTYX SENSOREADY (300 MG) 150 MG/ML ~~LOC~~ SOAJ: 300.0000 mg | SUBCUTANEOUS | 0 refills | Status: DC

## 2019-05-17 NOTE — Telephone Encounter (Signed)
Refill request received via fax  Last Visit: 11/05/18 Next visit: 06/10/19 Labs: 03/11/19 stable TB Gold: 10/15/18 neg   Okay to refill per Dr. Estanislado Pandy

## 2019-05-27 ENCOUNTER — Ambulatory Visit: Payer: Self-pay | Admitting: Physician Assistant

## 2019-05-30 NOTE — Progress Notes (Signed)
Office Visit Note  Patient: Joseph [name]             Date of Birth: Oct 22, 1981           MRN: [id number]             PCP: Dione Housekeeper, MD Referring: Dione Housekeeper, MD Visit Date: 06/10/2019 Occupation: _0 @  Subjective:  Pedal edema     History of Present Illness: Joseph [name] is a 38 y.o. male with history of psoriatic arthritis and osteoarthritis.  Patient is on Cosentyx 3 mg subcutaneous injections every 28 days, methotrexate 0.6 mL subcutaneous injections once weekly and folic acid 1 mg by mouth daily. He denies any recent psoriatic arthritis flares.  He states about about 3 days prior to cosentyx he experiences a mild increase in joint stiffness. He uses clobetasol cream topically as needed for psoriasis.He denies any active psoriasis.  He denies any achilles tendonitis or plantar fasciitis.  He denies any SI joint pain.  He has noticed increased pedal edema bilaterally.  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  He has tried compression stockings in the past, which were not very effective.   Activities of Daily Living:  Patient reports morning stiffness for 0 none.   Patient Reports nocturnal pain.  Difficulty dressing/grooming: Denies Difficulty climbing stairs: Denies Difficulty getting out of chair: Denies Difficulty using hands for taps, buttons, cutlery, and/or writing: Denies  Review of Systems  Constitutional: Negative for fatigue and night sweats.  HENT: Negative for mouth sores, mouth dryness and nose dryness.   Eyes: Negative for redness and dryness.  Respiratory: Negative for cough, hemoptysis, shortness of breath and difficulty breathing.   Cardiovascular: Positive for swelling in legs/feet. Negative for chest pain, palpitations, hypertension and irregular heartbeat.  Gastrointestinal: Negative for blood in stool, constipation and diarrhea.  Endocrine: Negative for increased urination.  Genitourinary: Negative for painful urination.   Musculoskeletal: Positive for joint swelling. Negative for arthralgias, joint pain, myalgias, muscle weakness, morning stiffness, muscle tenderness and myalgias.  Skin: Negative for color change, rash, hair loss, nodules/bumps, skin tightness, ulcers and sensitivity to sunlight.  Allergic/Immunologic: Negative for susceptible to infections.  Neurological: Negative for dizziness, fainting, memory loss, night sweats and weakness.  Hematological: Negative for bruising/bleeding tendency and swollen glands.  Psychiatric/Behavioral: Negative for depressed mood and sleep disturbance. The patient is not nervous/anxious.     PMFS History:  Patient Active Problem List   Diagnosis Date Noted  . Varicose veins of both lower extremities 07/26/2017  . Acute sinus infection 11/07/2014  . Other acute sinusitis 11/07/2014  . Iron deficiency anemia 09/21/2014  . Psoriatic arthritis ([place]) 09/21/2014  . ESSENTIAL HYPERTENSION, MALIGNANT 02/18/2009  . Nonspecific (abnormal) findings on radiological and other examination of body structure 02/16/2009  . CHEST XRAY, ABNORMAL 02/16/2009    Past Medical History:  Diagnosis Date  . Acute sinus infection 11/07/2014  . Arthritis   . Blood transfusion without reported diagnosis    At [facility]   . Iron deficiency anemia 09/21/2014    Family History  Problem Relation Age of Onset  . Pulmonary fibrosis Mother   . Pancreatic cancer Maternal Grandmother    Past Surgical History:  Procedure Laterality Date  . COLONOSCOPY WITH ESOPHAGOGASTRODUODENOSCOPY (EGD)    . HERNIA REPAIR  1994  . 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 MMR 02/12/2001  . Pneumococcal Polysaccharide-23 11/10/2012  . Td 08/17/2001     Objective: Vital Signs: BP 128/87 (BP Location: Left Arm,  Patient Position: Sitting, Cuff Size: Large)   Pulse 80   Resp 16   Ht _0  (1.778 m)   Wt (!) 318 lb 9.6 oz (144.5 kg)   BMI 45.71 kg/m    Physical Exam Vitals signs and nursing note reviewed.  Constitutional:      Appearance: [name] well-developed.  HENT:     Head: Normocephalic and atraumatic.  Eyes:     Conjunctiva/sclera: Conjunctivae normal.     Pupils: Pupils are equal, round, and reactive to light.  Neck:     Musculoskeletal: Normal range of motion and neck supple.  Cardiovascular:     Rate and Rhythm: Normal rate and regular rhythm.     Heart sounds: Normal heart sounds.  Pulmonary:     Effort: Pulmonary effort is normal.     Breath sounds: Normal breath sounds.  Abdominal:     General: Bowel sounds are normal.     Palpations: Abdomen is soft.  Skin:    General: Skin is warm and dry.     Capillary Refill: Capillary refill takes less than 2 seconds.  Neurological:     Mental Status: [name] alert and oriented to person, place, and time.  Psychiatric:        Behavior: Behavior normal.      Musculoskeletal Exam: C-spine, thoracic spine, lumbar spine good range of motion.  No midline spinal tenderness.  No SI joint tenderness.  Shoulder joints, elbow joints, wrist joints, MCPs, PIPs, DIPs good range of motion with no synovitis.  Hip joints, knee joints, ankle joints, MTPs, PIPs, DIPs good range of motion with no synovitis.  No warmth or effusion of bilateral knee joints.  No tenderness or swelling of ankle joints.  No Achilles tendinitis or plantar fasciitis.  He has pedal edema bilaterally.  CDAI Exam: CDAI Score: 0.7  Patient Global: 5 mm; Provider Global: 2 mm Swollen: 0 ; Tender: 0  Joint Exam   No joint exam has been documented for this visit   There is currently no information documented on the homunculus. Go to the Rheumatology activity and complete the homunculus joint exam.  Investigation: No additional findings.  Imaging: No results found.  Recent  Labs: Lab Results  Component Value Date   WBC 8.8 03/11/2019   HGB 11.8 (L) 03/11/2019   PLT 202 03/11/2019   NA 139 03/11/2019   K 3.7 03/11/2019   CL 102 03/11/2019   CO2 21 03/11/2019   GLUCOSE 109 (H) 03/11/2019   BUN 10 03/11/2019   CREATININE 1.00 03/11/2019   BILITOT 0.4 03/11/2019   ALKPHOS 104 03/11/2019   AST 14 03/11/2019   ALT 16 03/11/2019   PROT 7.1 03/11/2019   ALBUMIN 3.8 (L) 03/11/2019   CALCIUM 8.2 (L) 03/11/2019   GFRAA 112 03/11/2019   QFTBGOLD Negative 04/03/2017   QFTBGOLDPLUS NEGATIVE 10/15/2018    Speciality Comments: Prior therapy: Enbrel/Humira (inadequate response)  and Remicaide (d/c due to cost and time consuming infusion)  Procedures:  No procedures performed Allergies: Bee venom and Sulfonamide derivatives    Assessment / Plan:     Visit Diagnoses: Psoriatic arthritis ([place]) - He has no synovitis or dactylitis on exam.  He has not had any recent psoriatic arthritis flares.  [name]  clinically doing well on Cosentyx 300 mg every days injections every 28 days, methotrexate 0.6 mg subcutaneous injections once weekly and folic acid 1 mg by mouth daily.  He reports that about 3 days prior to his Cosentyx injection he develops increased joint stiffness but denies any joint swelling or joint pain.  He has no joint pain at this time.  [name] experiencing increased pedal edema.  We discussed wearing compression hose as well as limiting his salt intake.  He was advised to follow-up with his PCP for further evaluation.  He has no Achilles tendinitis or plantar fasciitis.  He has no SI joint tenderness on exam.  He has no active psoriasis at this time.  He will continue on the current treatment regimen.  He does not need any refills at this time.  [name] advised to notify us if he develops increased joint pain or joint swelling.  He will follow-up in the office in 5 months.  Psoriasis - He has no psoriasis at this time.  He does have clobetasol cream which she can  apply topically as needed if he develops increased patches of psoriasis.  High risk medication use -Cosentyx 300 mg every 28 days (started on 08/18/18), methotrexate 0.6 mL every 7 days, and folic acid 1 mg 1 tablet daily.(inadequate response to Enbrel, Remicade).  Last TB gold negative on 10/15/2018 and will monitor yearly.  Most recent CBC/CMP stable on 03/11/2019.  Due for CBC/CMP today and will monitor every 3 months.  Standing orders are in place.  I discussed the importance of holding Cosentyx and methotrexate if he develops any signs or symptoms of an infection and to resume once the infection has completely cleared.  The importance of social distancing and following the standard precautions recommended by the CDC were discussed. - Plan: CBC with Differential/Platelet, COMPLETE METABOLIC PANEL WITH GFR  Primary osteoarthritis of both hands -He has no hand pain or joint swelling at this time.  He has complete fist formation bilaterally.  Joint protection and muscle strengthening were discussed.  Tendinopathy of right shoulder -He has good ROM with no discomfort.   Pedal edema: He has pedal edema bilaterally.  He has been experiencing increased edema after 12-hour shifts of walking on concrete.  We discussed wearing compression hose and limiting his salt intake.  He was advised to follow-up with his PCP for further evaluation.  Other medical conditions are listed as follows:  History of iron deficiency anemia   History of hypertension   Varicose veins of both lower extremities, unspecified whether complicated   Iron deficiency anemia   Orders: Orders Placed This Encounter  Procedures  . CBC with Differential/Platelet  . COMPLETE METABOLIC PANEL WITH GFR   No orders of the defined types were placed in this encounter.   Follow-Up Instructions: Return in about 5 months (around 11/10/2019) for Psoriatic arthritis, Osteoarthritis.   Ofilia Neas, PA-C  Note - This record has been  created using Dragon software.  Chart creation errors have been sought, but may not always  have been located. Such creation errors do not reflect on  the standard of medical care.

## 2019-06-10 ENCOUNTER — Ambulatory Visit: Payer: Managed Care, Other (non HMO) | Admitting: Physician Assistant

## 2019-06-10 ENCOUNTER — Encounter: Payer: Self-pay | Admitting: Physician Assistant

## 2019-06-10 ENCOUNTER — Other Ambulatory Visit: Payer: Self-pay

## 2019-06-10 VITALS — BP 128/87 | HR 80 | Resp 16 | Ht 70.0 in | Wt 318.6 lb

## 2019-06-10 DIAGNOSIS — L4050 Arthropathic psoriasis, unspecified: Secondary | ICD-10-CM | POA: Diagnosis not present

## 2019-06-10 DIAGNOSIS — Z79899 Other long term (current) drug therapy: Secondary | ICD-10-CM | POA: Diagnosis not present

## 2019-06-10 DIAGNOSIS — L409 Psoriasis, unspecified: Secondary | ICD-10-CM | POA: Diagnosis not present

## 2019-06-10 DIAGNOSIS — M19042 Primary osteoarthritis, left hand: Secondary | ICD-10-CM

## 2019-06-10 DIAGNOSIS — M19041 Primary osteoarthritis, right hand: Secondary | ICD-10-CM

## 2019-06-10 DIAGNOSIS — Z862 Personal history of diseases of the blood and blood-forming organs and certain disorders involving the immune mechanism: Secondary | ICD-10-CM

## 2019-06-10 DIAGNOSIS — Z8679 Personal history of other diseases of the circulatory system: Secondary | ICD-10-CM

## 2019-06-10 DIAGNOSIS — I8393 Asymptomatic varicose veins of bilateral lower extremities: Secondary | ICD-10-CM

## 2019-06-10 DIAGNOSIS — M67911 Unspecified disorder of synovium and tendon, right shoulder: Secondary | ICD-10-CM

## 2019-06-10 DIAGNOSIS — R600 Localized edema: Secondary | ICD-10-CM

## 2019-06-11 LAB — CBC WITH DIFFERENTIAL/PLATELET
Absolute Monocytes: 927 cells/uL (ref 200–950)
Basophils Absolute: 52 cells/uL (ref 0–200)
Basophils Relative: 0.5 %
Eosinophils Absolute: 175 cells/uL (ref 15–500)
Eosinophils Relative: 1.7 %
HCT: 37.6 % — ABNORMAL LOW (ref 38.5–50.0)
Hemoglobin: 11.6 g/dL — ABNORMAL LOW (ref 13.2–17.1)
Lymphs Abs: 2709 cells/uL (ref 850–3900)
MCH: 24.1 pg — ABNORMAL LOW (ref 27.0–33.0)
MCHC: 30.9 g/dL — ABNORMAL LOW (ref 32.0–36.0)
MCV: 78 fL — ABNORMAL LOW (ref 80.0–100.0)
MPV: 10.2 fL (ref 7.5–12.5)
Monocytes Relative: 9 %
Neutro Abs: 6438 cells/uL (ref 1500–7800)
Neutrophils Relative %: 62.5 %
Platelets: 208 10*3/uL (ref 140–400)
RBC: 4.82 10*6/uL (ref 4.20–5.80)
RDW: 18.8 % — ABNORMAL HIGH (ref 11.0–15.0)
Total Lymphocyte: 26.3 %
WBC: 10.3 10*3/uL (ref 3.8–10.8)

## 2019-06-11 LAB — COMPLETE METABOLIC PANEL WITH GFR
AG Ratio: 1.1 (calc) (ref 1.0–2.5)
ALT: 13 U/L (ref 9–46)
AST: 13 U/L (ref 10–40)
Albumin: 3.9 g/dL (ref 3.6–5.1)
Alkaline phosphatase (APISO): 93 U/L (ref 36–130)
BUN: 16 mg/dL (ref 7–25)
CO2: 24 mmol/L (ref 20–32)
Calcium: 8.2 mg/dL — ABNORMAL LOW (ref 8.6–10.3)
Chloride: 102 mmol/L (ref 98–110)
Creat: 1.18 mg/dL (ref 0.60–1.35)
GFR, Est African American: 91 mL/min/{1.73_m2} (ref >=60)
GFR, Est Non African American: 79 mL/min/{1.73_m2} (ref >=60)
Globulin: 3.5 g/dL (calc) (ref 1.9–3.7)
Glucose, Bld: 93 mg/dL (ref 65–99)
Potassium: 3.3 mmol/L — ABNORMAL LOW (ref 3.5–5.3)
Sodium: 137 mmol/L (ref 135–146)
Total Bilirubin: 0.6 mg/dL (ref 0.2–1.2)
Total Protein: 7.4 g/dL (ref 6.1–8.1)

## 2019-06-13 NOTE — Progress Notes (Signed)
Patient is chronically anemic but hgb is stable.  Potassium is low-3.3. Is he taking a potassium supplement? Calcium is low 8.2. He should be taking calcium 1200 mg po daily.  Please notify patient and forward to PCP.  If he does not have a PCP, we can refer him to Dr. Junius Roads or somewhere convenient for him.

## 2019-07-01 ENCOUNTER — Ambulatory Visit: Payer: Managed Care, Other (non HMO) | Admitting: Family Medicine

## 2019-07-29 ENCOUNTER — Telehealth: Payer: Self-pay | Admitting: *Deleted

## 2019-07-29 NOTE — Telephone Encounter (Signed)
Received fax regarding a prior authorization for Cosentyx.   Key: [id number]

## 2019-07-29 NOTE — Telephone Encounter (Signed)
Submitted a Prior Authorization request to [employer] for [place] via Cover My Meds. Will update once we receive a response.  PA# [id number]  1:57 PM Beatriz Chancellor, CPhT

## 2019-08-01 NOTE — Telephone Encounter (Signed)
Received notification from [place] regarding a prior authorization for [place]. Authorization has been APPROVED from 08/01/19 to 07/31/20.   Will send document to scan center.  Authorization # [id number]

## 2019-08-10 ENCOUNTER — Telehealth: Payer: Self-pay | Admitting: Pharmacist

## 2019-08-10 DIAGNOSIS — L4050 Arthropathic psoriasis, unspecified: Secondary | ICD-10-CM

## 2019-08-10 DIAGNOSIS — L409 Psoriasis, unspecified: Secondary | ICD-10-CM

## 2019-08-10 MED ORDER — COSENTYX SENSOREADY (300 MG) 150 MG/ML ~~LOC~~ SOAJ: 150.0000 mg | SUBCUTANEOUS | 0 refills | Status: DC

## 2019-08-10 NOTE — Telephone Encounter (Addendum)
Received call from wife, Janett Billow, that patient has started a new job.  His new insurance as not started and prior insurance has expired.  [name] due for his next Cosentyx dose at the end of the week.  She wanted to know what options they had for interim coverage.  Informed patient that we have a sample we can provide for his next dose.  If there are any other changes or delays we can apply for patient assistance if need be.    She states that he has been having increased back pain and ankle pain as his new job is more physically demanding.  Asked for OTC recommendations. Advised patient could try topical gel with lidocaine/menthol/diclofenac.   She states symptoms are worse about a week before dose.  His current dosing for Cosentyx is 300 mg every 28 days.  Advised to switch dosing to 150 mg every 14 days to see if there is an improvement in symptoms. Also advised that if symptoms persist to schedule an earlier appointment.  She will stop by later this week for sample.  All questions encouraged and answered.  Instructed patient to call with any other questions or concerns.   Mariella Saa, PharmD, [place], [place] Clinical Specialty Pharmacist [phone]  08/10/2019 1:42 PM

## 2019-09-08 MED ORDER — COSENTYX SENSOREADY PEN 150 MG/ML ~~LOC~~ SOAJ: 150.0000 mg | SUBCUTANEOUS | 0 refills | Status: DC

## 2019-09-08 NOTE — Addendum Note (Signed)
Addended by: Mariella Saa C on: 09/08/2019 11:38 AM   Modules accepted: Orders

## 2019-09-08 NOTE — Telephone Encounter (Signed)
Patient's wife, Janett Billow, called to inform our office that they have received new insurance information.  She has [employer] with information below:  [id number] [place] that patient will be required to fill at [facility]  His next injection is due tomorrow. Advised that I will send a prescription to [facility] but it might required a PA which could delay the fill.  Will work with [facility] to see if we can fill prescription by tomorrow.    Will return call to Janett Billow at [phone] and notify whether she can pick up from [facility] or if she will need to stop by the office for a sample.  She asked about price as they were previously paying $0.  Asked if she had a co-pay card.  She was not aware of them using a co-pay card but said someone from the office could have signed them up.  Will call for co-pay card information if needed.  She also stated that with the new plan he is required to have a PCP send a referral to Dr. Estanislado Pandy.  He does not currently have a PCP.  She states our office recommended [employer].  I am not familiar with Dr. Junius Roads.  Looked up information for [facility] and gave her the phone number listed.  All questions encouraged and answered.  Instructed wife to call with any other questions or concerns.   Mariella Saa, PharmD, [place], [place] Clinical Specialty Pharmacist [phone]  09/08/2019 11:35 AM

## 2019-09-08 NOTE — Addendum Note (Signed)
Addended by: Mariella Saa C on: 09/08/2019 11:40 AM   Modules accepted: Orders

## 2019-09-08 NOTE — Telephone Encounter (Signed)
Submitted an urgent  Prior Authorization request to [facility] for COSENTYX via Cover My Meds. Will update once we receive a response.  [employer] usually takes at least 24 hours to decide on new PA requests. Patient probably should come by office for sample to prevent delay.   12:09 PM Beatriz Chancellor, CPhT

## 2019-09-09 ENCOUNTER — Telehealth: Payer: Self-pay | Admitting: *Deleted

## 2019-09-09 NOTE — Telephone Encounter (Signed)
Medication Samples have been provided to the patient.  Drug name: Cosentyx       Strength: 150 mg      Qty: 1  LOT: [id number]  Exp.Date: 03/2020  Dosing instructions: Inject 150 mg every 14 days.   The patient has been instructed regarding the correct time, dose, and frequency of taking this medication, including desired effects and most common side effects.   Gwenlyn Perking 4:36 PM 09/09/2019

## 2019-09-13 ENCOUNTER — Ambulatory Visit: Payer: Managed Care, Other (non HMO) | Admitting: Pharmacist

## 2019-09-13 ENCOUNTER — Other Ambulatory Visit: Payer: Self-pay

## 2019-09-13 DIAGNOSIS — Z7189 Other specified counseling: Secondary | ICD-10-CM

## 2019-09-13 MED ORDER — COSENTYX SENSOREADY PEN 150 MG/ML ~~LOC~~ SOAJ: 150.0000 mg | SUBCUTANEOUS | 0 refills | Status: DC

## 2019-09-13 NOTE — Telephone Encounter (Signed)
Received notification from [facility] regarding a prior authorization for [place]. Authorization has been APPROVED from 09/08/2019 to 09/06/2020.   Will send document to scan center.  Authorization # [id number]   Will call patient later today to schedule 1st shipment from [facility]

## 2019-09-13 NOTE — Telephone Encounter (Signed)
Scheduled patient's shipment for 09/21/2019 delivery to pt's home. Patient has $192 copay. Wife will call to get patient signed up for Cosentyx Debit Card. She will call pharmacy back with the card info. Sent email to [facility]. Will keep an eye on and follow up if needed.  10:53 AM Beatriz Chancellor, CPhT

## 2019-09-13 NOTE — Progress Notes (Signed)
   S: Patient presents today for review of their specialty medication.   Patient is currently taking Cosentyx (secukinumab) for psoriatic arthritis. Patient is managed by Dr. Estanislado Pandy for this. Pt's dosing frequency was recently changed from q28 to q14 days.   Dosing: Psoriatic arthritis: SubQ: 150 mg every 14 days.   Adherence: confirms. Next injection 09/23/19.   Efficacy: pt's wife reports good efficacy.   Current adverse effects: S/sx of infection: denies  GI upset: denies  Headache: denies  S/sx of hypersensitivity: denies   O:  Last TB gold: negative 10/15/18  Last CBC below: pt noted to be chronically anemic but stable per Rheumatology   Lab Results  Component Value Date   WBC 10.3 06/10/2019   HGB 11.6 (L) 06/10/2019   HCT 37.6 (L) 06/10/2019   MCV 78.0 (L) 06/10/2019   PLT 208 06/10/2019      Chemistry      Component Value Date/Time   NA 137 06/10/2019 1025   NA 139 03/11/2019 1354   NA 138 10/25/2014 1011   K 3.3 (L) 06/10/2019 1025   K 3.2 (L) 10/25/2014 1011   CL 102 06/10/2019 1025   CO2 24 06/10/2019 1025   CO2 24 10/25/2014 1011   BUN 16 06/10/2019 1025   BUN 10 03/11/2019 1354   BUN 13.4 10/25/2014 1011   CREATININE 1.18 06/10/2019 1025   CREATININE 1.0 10/25/2014 1011      Component Value Date/Time   CALCIUM 8.2 (L) 06/10/2019 1025   CALCIUM 8.5 10/25/2014 1011   ALKPHOS 104 03/11/2019 1354   ALKPHOS 83 10/25/2014 1011   AST 13 06/10/2019 1025   AST 18 10/25/2014 1011   ALT 13 06/10/2019 1025   ALT 16 10/25/2014 1011   BILITOT 0.6 06/10/2019 1025   BILITOT 0.4 03/11/2019 1354   BILITOT 0.43 10/25/2014 1011      A/P: 1. Medication review: Patient is currently on Cosentyx for psoriatic arthritis and is tolerating it well. Reviewed the medication with the patient, including the following: Cosentyx is a monoclonal antibody used in the treatment of ankylosing spondylitis, psoriasis, and psoriatic arthritis. The injection is subq and the  medication should be allowed to reach room temp prior to injecting. Injection sites should be rotated. Possible adverse effects include headaches, GI upset, increased risk of infection and hypersensitivity reactions. No recommendations for any changes at this time.  Benard Halsted, PharmD, [place] [phone]

## 2019-09-20 ENCOUNTER — Other Ambulatory Visit: Payer: Self-pay | Admitting: Pharmacist

## 2019-09-20 DIAGNOSIS — L4050 Arthropathic psoriasis, unspecified: Secondary | ICD-10-CM

## 2019-09-20 MED ORDER — "TUBERCULIN-ALLERGY SYRINGES 27G X 1/2"" 1 ML KIT": PACK | 0 refills | Status: DC

## 2019-09-20 MED ORDER — METHOTREXATE SODIUM CHEMO INJECTION 50 MG/2ML: INTRAMUSCULAR | 0 refills | Status: DC

## 2019-09-20 MED FILL — COSENTYX 300 MG DOSE-2 PENS: 150 | 28 days supply | Qty: 2 | Fill #0

## 2019-09-22 ENCOUNTER — Telehealth: Payer: Self-pay | Admitting: Rheumatology

## 2019-09-22 MED ORDER — FOLIC ACID 1 MG PO TABS: 1.0000 mg | ORAL_TABLET | Freq: Every day | ORAL | 3 refills | Status: DC

## 2019-09-22 NOTE — Telephone Encounter (Signed)
Patient's wife Joseph [name] called requesting prescription refill of Folic Acid to be sent to [facility] in [place].   Joseph [name] is requesting a return call to let her know when the prescription is sent to the pharmacy.

## 2019-09-22 NOTE — Telephone Encounter (Signed)
Last Visit: 06/10/19  Next visit: 11/10/19  Okay to refill per Dr. Estanislado Pandy   Patient's wife advised prescription was sent to the pharmacy.

## 2019-10-19 MED FILL — COSENTYX 300 MG DOSE-2 PENS: 150 | 28 days supply | Qty: 2 | Fill #1

## 2019-11-10 ENCOUNTER — Ambulatory Visit: Payer: Managed Care, Other (non HMO) | Admitting: Physician Assistant

## 2019-11-15 ENCOUNTER — Ambulatory Visit: Payer: Managed Care, Other (non HMO) | Admitting: Family Medicine

## 2019-11-15 MED FILL — COSENTYX 300 MG DOSE-2 PENS: 150 | 28 days supply | Qty: 2 | Fill #2

## 2019-11-18 ENCOUNTER — Ambulatory Visit: Payer: Self-pay | Admitting: Family Medicine

## 2019-11-25 ENCOUNTER — Other Ambulatory Visit: Payer: Self-pay

## 2019-11-25 ENCOUNTER — Ambulatory Visit (INDEPENDENT_AMBULATORY_CARE_PROVIDER_SITE_OTHER): Payer: Self-pay | Admitting: Family Medicine

## 2019-11-25 ENCOUNTER — Encounter: Payer: Self-pay | Admitting: Family Medicine

## 2019-11-25 VITALS — BP 142/103 | HR 74 | Resp 18 | Ht 70.0 in | Wt 318.0 lb

## 2019-11-25 DIAGNOSIS — R739 Hyperglycemia, unspecified: Secondary | ICD-10-CM

## 2019-11-25 DIAGNOSIS — D509 Iron deficiency anemia, unspecified: Secondary | ICD-10-CM

## 2019-11-25 DIAGNOSIS — I10 Essential (primary) hypertension: Secondary | ICD-10-CM

## 2019-11-25 DIAGNOSIS — R002 Palpitations: Secondary | ICD-10-CM

## 2019-11-25 DIAGNOSIS — Z6841 Body Mass Index (BMI) 40.0 and over, adult: Secondary | ICD-10-CM | POA: Insufficient documentation

## 2019-11-25 DIAGNOSIS — Z0000 Encounter for general adult medical examination without abnormal findings: Secondary | ICD-10-CM

## 2019-11-25 DIAGNOSIS — L4050 Arthropathic psoriasis, unspecified: Secondary | ICD-10-CM

## 2019-11-25 DIAGNOSIS — R6882 Decreased libido: Secondary | ICD-10-CM

## 2019-11-25 NOTE — Progress Notes (Signed)
Office Visit Note   Patient: Joseph [name]           Date of Birth: [birth year]           MRN: [id number] Visit Date: 11/25/2019 Requested by: Joette Catching, MD [address] [place],  [place] [address] PCP: Lavada Mesi, MD  Subjective: Chief Complaint  Patient presents with  . establish PCP (last PCP retired)  . heart palpitations    HPI: [name] here to establish care.  His previous PCP retired.  He has a history of psoriatic arthritis first diagnosed at age 83.  [name] doing well now with medications under supervision of Dr. Link Snuffer.  He has hypertension not currently being treated with medication.  He checks his blood pressure occasionally.  Lately he has been having palpitations.  They has happened about 3 times per week for the past few weeks.  Usually they are brief, but once it lasted about 30 minutes.  No chest pain or shortness of breath.  He has also been having decreased libido and occasional fatigue.  He would like to have his testosterone level checked.  Previous labs have been notable for hyperglycemia.  He has also had iron deficiency anemia in the past.  Family history was updated today.               ROS: No fever or chills.  No head congestion.  No vision change.  No constipation or diarrhea.  All other systems were reviewed and are negative.  Objective: Vital Signs: BP (!) 142/103 (BP Location: Left Arm, Patient Position: Sitting, Cuff Size: Large)   Pulse 74   Resp 18   Ht 5\' 10"  (1.778 m)   Wt (!) 318 lb (144.2 kg)   BMI 45.63 kg/m   Physical Exam:  General:  Alert and oriented, in no acute distress. Pulm:  Breathing unlabored. Psy:  Normal mood, congruent affect. Skin: No suspicious lesions. HEENT:  [place]/AT, PERRLA, EOM Full, no nystagmus.  Funduscopic examination within normal limits.  No conjunctival erythema.  Tympanic membranes are pearly gray with normal landmarks.  External ear canals are occluded by cerumen.  Nasal passages are  clear.  Oropharynx is clear.  No significant lymphadenopathy.  No thyromegaly or nodules.  2+ carotid pulses without bruits. CV: Regular rate and rhythm without murmurs, rubs, or gallops.  No peripheral edema.  2+ radial and posterior tibial pulses. Lungs: Clear to auscultation throughout with no wheezing or areas of consolidation. Abdomen: Midline hernia scar well-healed.  No hepatosplenomegaly, bowel sounds are active. Extremities: Longitudinal ridging of his fingernails.  No joint effusions.   Imaging: None today  Assessment & Plan: 1.  Wellness examination -Labs to evaluate.  2.  Elevated blood pressure -I will ask him to start monitoring at home.  If readings remain elevated, we will start medication.  3.  Decreased libido -Check testosterone level.  4.  History of anemia -Recheck today.  5.  Palpitations -If labs are normal and symptoms persist, we will order a monitor.  6.  Obesity - Minimize processed carbs & sweets.     Procedures: No procedures performed  No notes on file     PMFS History: Patient Active Problem List   Diagnosis Date Noted  . Hair loss 12/31/2017  . Varicose veins of both lower extremities 07/26/2017  . Iron deficiency anemia 09/21/2014  . Psoriatic arthritis ([facility]) 09/21/2014  . ESSENTIAL HYPERTENSION, MALIGNANT 02/18/2009  . Nonspecific (abnormal) findings on radiological and other examination  of body structure 02/16/2009  . CHEST XRAY, ABNORMAL 02/16/2009   Past Medical History:  Diagnosis Date  . Acute sinus infection 11/07/2014  . Arthritis   . Blood transfusion without reported diagnosis    At [facility]   . Iron deficiency anemia 09/21/2014    Family History  Problem Relation Age of Onset  . Pulmonary fibrosis Mother   . Hypertension Mother   . Pancreatic cancer Maternal Grandmother   . Rheum arthritis Paternal Aunt   . Cancer Paternal Grandmother   . Breast cancer Paternal Grandmother   . COPD Paternal Grandfather   .  CAD Paternal Grandfather   . Colon cancer Neg Hx   . Prostate cancer Neg Hx   . Diabetes Neg Hx   . Thyroid disease Neg Hx     Past Surgical History:  Procedure Laterality Date  . COLONOSCOPY WITH ESOPHAGOGASTRODUODENOSCOPY (EGD)    . HERNIA REPAIR  1994  . 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 Not on file  Tobacco Use  . Smoking status: Never Smoker  . Smokeless tobacco: Never Used  Substance and Sexual Activity  . Alcohol use: No  . Drug use: No  . Sexual activity: Not on file

## 2019-11-28 LAB — THYROID PANEL WITH TSH
Free Thyroxine Index: 2.2 (ref 1.4–3.8)
T3 Uptake: 32 % (ref 22–35)
T4, Total: 6.8 ug/dL (ref 4.9–10.5)
TSH: 2.79 mIU/L (ref 0.40–4.50)

## 2019-11-28 LAB — LIPID PANEL
Cholesterol: 162 mg/dL (ref <200)
HDL: 31 mg/dL — ABNORMAL LOW (ref >=40)
LDL Cholesterol (Calc): 99 mg/dL (calc)
Non-HDL Cholesterol (Calc): 131 mg/dL (calc) — ABNORMAL HIGH (ref <130)
Total CHOL/HDL Ratio: 5.2 (calc) — ABNORMAL HIGH (ref <5.0)
Triglycerides: 202 mg/dL — ABNORMAL HIGH (ref <150)

## 2019-11-28 LAB — VITAMIN D 25 HYDROXY (VIT D DEFICIENCY, FRACTURES): Vit D, 25-Hydroxy: 15 ng/mL — ABNORMAL LOW (ref 30–100)

## 2019-11-28 LAB — HEMOGLOBIN A1C
Hgb A1c MFr Bld: 5.6 % of total Hgb (ref <5.7)
Mean Plasma Glucose: 114 (calc)
eAG (mmol/L): 6.3 (calc)

## 2019-11-28 LAB — TESTOSTERONE TOTAL,FREE,BIO, MALES
Albumin: 3.9 g/dL (ref 3.6–5.1)
Sex Hormone Binding: 22 nmol/L (ref 10–50)
Testosterone: 192 ng/dL — ABNORMAL LOW (ref 250–827)

## 2019-11-28 LAB — COMPREHENSIVE METABOLIC PANEL
AG Ratio: 1.1 (calc) (ref 1.0–2.5)
ALT: 12 U/L (ref 9–46)
AST: 12 U/L (ref 10–40)
Albumin: 3.9 g/dL (ref 3.6–5.1)
Alkaline phosphatase (APISO): 92 U/L (ref 36–130)
BUN: 10 mg/dL (ref 7–25)
CO2: 26 mmol/L (ref 20–32)
Calcium: 8.4 mg/dL — ABNORMAL LOW (ref 8.6–10.3)
Chloride: 101 mmol/L (ref 98–110)
Creat: 0.95 mg/dL (ref 0.60–1.35)
Globulin: 3.7 g/dL (calc) (ref 1.9–3.7)
Glucose, Bld: 98 mg/dL (ref 65–99)
Potassium: 3.8 mmol/L (ref 3.5–5.3)
Sodium: 136 mmol/L (ref 135–146)
Total Bilirubin: 0.5 mg/dL (ref 0.2–1.2)
Total Protein: 7.6 g/dL (ref 6.1–8.1)

## 2019-11-28 LAB — CBC WITH DIFFERENTIAL/PLATELET
Absolute Monocytes: 740 cells/uL (ref 200–950)
Basophils Absolute: 60 cells/uL (ref 0–200)
Basophils Relative: 0.6 %
Eosinophils Absolute: 170 cells/uL (ref 15–500)
Eosinophils Relative: 1.7 %
HCT: 40.2 % (ref 38.5–50.0)
Hemoglobin: 12.7 g/dL — ABNORMAL LOW (ref 13.2–17.1)
Lymphs Abs: 2480 cells/uL (ref 850–3900)
MCH: 24.3 pg — ABNORMAL LOW (ref 27.0–33.0)
MCHC: 31.6 g/dL — ABNORMAL LOW (ref 32.0–36.0)
MCV: 76.9 fL — ABNORMAL LOW (ref 80.0–100.0)
MPV: 11.3 fL (ref 7.5–12.5)
Monocytes Relative: 7.4 %
Neutro Abs: 6550 cells/uL (ref 1500–7800)
Neutrophils Relative %: 65.5 %
Platelets: 232 10*3/uL (ref 140–400)
RBC: 5.23 10*6/uL (ref 4.20–5.80)
RDW: 17.3 % — ABNORMAL HIGH (ref 11.0–15.0)
Total Lymphocyte: 24.8 %
WBC: 10 10*3/uL (ref 3.8–10.8)

## 2019-11-28 LAB — IRON,TIBC AND FERRITIN PANEL
%SAT: 8 % (calc) — ABNORMAL LOW (ref 20–48)
Ferritin: 7 ng/mL — ABNORMAL LOW (ref 38–380)
Iron: 27 ug/dL — ABNORMAL LOW (ref 50–180)
TIBC: 347 mcg/dL (calc) (ref 250–425)

## 2019-11-29 ENCOUNTER — Telehealth: Payer: Self-pay | Admitting: Family Medicine

## 2019-11-29 DIAGNOSIS — D509 Iron deficiency anemia, unspecified: Secondary | ICD-10-CM

## 2019-11-29 DIAGNOSIS — E349 Endocrine disorder, unspecified: Secondary | ICD-10-CM

## 2019-11-29 DIAGNOSIS — E559 Vitamin D deficiency, unspecified: Secondary | ICD-10-CM

## 2019-11-29 DIAGNOSIS — E785 Hyperlipidemia, unspecified: Secondary | ICD-10-CM

## 2019-11-29 NOTE — Telephone Encounter (Signed)
Labs are notable for the following:  Blood glucose and hemoglobin A1c are both in normal range, which is good.  Electrolytes and kidney function are normal.  Blood counts and iron studies are abnormal, indicating iron deficiency.  I recommend taking ferrous gluconate or sulfate, approximately 325 mg daily for the next 3 months and then we will recheck the levels.  Probably stop the supplement at that point.  Thyroid studies are in normal range.  Lipid panel and testosterone level are abnormal.  These typically will normalize with dietary change and weight loss.  From a dietary standpoint it is important to minimize intake of breads, pastas, cereals, sugars and sweets.  We can recheck levels in about 4 to 6 months if still having symptoms.  Vitamin D is very low at 15 (target level is 50-80).  I recommend vitamin D3, 5,000 IU tablets, take two of them daily for 3 months, and then one daily long-term after that.  Recheck level in 6 months.

## 2019-12-07 ENCOUNTER — Encounter: Payer: Self-pay | Admitting: Family Medicine

## 2019-12-08 ENCOUNTER — Encounter: Payer: Self-pay | Admitting: Family Medicine

## 2019-12-15 ENCOUNTER — Other Ambulatory Visit: Payer: Self-pay | Admitting: Rheumatology

## 2019-12-15 DIAGNOSIS — Z79899 Other long term (current) drug therapy: Secondary | ICD-10-CM

## 2019-12-15 DIAGNOSIS — Z111 Encounter for screening for respiratory tuberculosis: Secondary | ICD-10-CM

## 2019-12-15 NOTE — Telephone Encounter (Signed)
Last Visit: 06/10/2019 Next Visit: 12/30/2019 Labs: 11/25/2019 calcium 8.4, hemoglobin 12.7, MCV 76.9, MCH 24.3, MCHC 31.6, RDW 17.3 TB Gold: 10/15/2018 negative   Patient will update TB gold tomorrow morning in our office, per his wife.   Okay to refill per Dr. Corliss Skains.

## 2019-12-16 ENCOUNTER — Other Ambulatory Visit: Payer: Self-pay | Admitting: Pharmacist

## 2019-12-16 ENCOUNTER — Other Ambulatory Visit: Payer: Self-pay

## 2019-12-16 ENCOUNTER — Ambulatory Visit (INDEPENDENT_AMBULATORY_CARE_PROVIDER_SITE_OTHER): Payer: Self-pay

## 2019-12-16 DIAGNOSIS — I10 Essential (primary) hypertension: Secondary | ICD-10-CM

## 2019-12-16 DIAGNOSIS — Z111 Encounter for screening for respiratory tuberculosis: Secondary | ICD-10-CM

## 2019-12-16 DIAGNOSIS — Z79899 Other long term (current) drug therapy: Secondary | ICD-10-CM

## 2019-12-16 MED ORDER — COSENTYX SENSOREADY PEN 150 MG/ML ~~LOC~~ SOAJ: 150.0000 mg | SUBCUTANEOUS | 0 refills | Status: DC

## 2019-12-16 MED FILL — COSENTYX 300 MG DOSE-2 PENS: 150 | 28 days supply | Qty: 2 | Fill #0

## 2019-12-16 NOTE — Progress Notes (Signed)
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 Original paperwork given to the patient - copy will be placed in the chart.

## 2019-12-17 LAB — QUANTIFERON-TB GOLD PLUS
Mitogen-NIL: >10 IU/mL
NIL: 0.05 IU/mL
QuantiFERON-TB Gold Plus: NEGATIVE
TB1-NIL: <0 IU/mL
TB2-NIL: 0 IU/mL

## 2019-12-22 ENCOUNTER — Ambulatory Visit: Payer: No Typology Code available for payment source | Attending: Internal Medicine

## 2019-12-22 DIAGNOSIS — Z20822 Contact with and (suspected) exposure to covid-19: Secondary | ICD-10-CM

## 2019-12-22 NOTE — Progress Notes (Deleted)
Office Visit Note  Patient: Joseph [name]             Date of Birth: [birth year]           MRN: [id number]             PCP: Joseph Blase, MD Referring: Joseph Blase, MD Visit Date: 12/30/2019 Occupation: @GUAROCC @  Subjective:  No chief complaint on file.   History of Present Illness: Joseph [name] is a 39 y.o. male ***   Activities of Daily Living:  Patient reports morning stiffness for *** {minute/hour:19697}.   Patient {ACTIONS;DENIES/REPORTS:21021675::"Denies"} nocturnal pain.  Difficulty dressing/grooming: {ACTIONS;DENIES/REPORTS:21021675::"Denies"} Difficulty climbing stairs: {ACTIONS;DENIES/REPORTS:21021675::"Denies"} Difficulty getting out of chair: {ACTIONS;DENIES/REPORTS:21021675::"Denies"} Difficulty using hands for taps, buttons, cutlery, and/or writing: {ACTIONS;DENIES/REPORTS:21021675::"Denies"}  No Rheumatology ROS completed.   PMFS History:  Patient Active Problem List   Diagnosis Date Noted  . Morbid obesity with BMI of 45.0-49.9, adult ([facility]) 11/25/2019  . Hair loss 12/31/2017  . Varicose veins of both lower extremities 07/26/2017  . Iron deficiency anemia 09/21/2014  . Psoriatic arthritis ([place]) 09/21/2014  . ESSENTIAL HYPERTENSION, MALIGNANT 02/18/2009  . Nonspecific (abnormal) findings on radiological and other examination of body structure 02/16/2009  . CHEST XRAY, ABNORMAL 02/16/2009    Past Medical History:  Diagnosis Date  . Acute sinus infection 11/07/2014  . Arthritis   . Blood transfusion without reported diagnosis    At [facility]   . Iron deficiency anemia 09/21/2014    Family History  Problem Relation Age of Onset  . Pulmonary fibrosis Mother   . Hypertension Mother   . Pancreatic cancer Maternal Grandmother   . Rheum arthritis Paternal Aunt   . Cancer Paternal Grandmother   . Breast cancer Paternal Grandmother   . COPD Paternal Grandfather   . CAD Paternal Grandfather   . Colon cancer Neg Hx   . Prostate cancer Neg Hx   .  Diabetes Neg Hx   . Thyroid disease Neg Hx    Past Surgical History:  Procedure Laterality Date  . COLONOSCOPY WITH ESOPHAGOGASTRODUODENOSCOPY (EGD)    . HERNIA REPAIR  1994  . 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 MMR 02/12/2001  . Pneumococcal Polysaccharide-23 11/10/2012  . Td 08/17/2001     Objective: Vital Signs: There were no vitals taken for this visit.   Physical Exam   Musculoskeletal Exam: ***  CDAI Exam: CDAI Score: -- Patient Global: --; Provider Global: -- Swollen: --; Tender: -- Joint Exam 12/30/2019   No joint exam has been documented for this visit   There is currently no information documented on the homunculus. Go to the Rheumatology activity and complete the homunculus joint exam.  Investigation: No additional findings.  Imaging: No results found.  Recent Labs: Lab Results  Component Value Date   WBC 10.0 11/25/2019   HGB 12.7 (L) 11/25/2019   PLT 232 11/25/2019   NA 136 11/25/2019   K 3.8 11/25/2019   CL 101 11/25/2019   CO2 26 11/25/2019   GLUCOSE 98 11/25/2019   BUN 10 11/25/2019   CREATININE 0.95 11/25/2019   BILITOT 0.5 11/25/2019   ALKPHOS 104 03/11/2019   AST 12 11/25/2019   ALT 12 11/25/2019   PROT 7.6 11/25/2019   ALBUMIN 3.8 (L) 03/11/2019  CALCIUM 8.4 (L) 11/25/2019   GFRAA 91 06/10/2019   QFTBGOLD Negative 04/03/2017   QFTBGOLDPLUS NEGATIVE 12/16/2019    Speciality Comments: Prior therapy: Enbrel/Humira (inadequate response)  and Remicaide (d/c due to cost and time consuming infusion)  Procedures:  No procedures performed Allergies: Bee venom and Sulfonamide derivatives   Assessment / Plan:     Visit Diagnoses: No diagnosis found.  Orders: No orders of the defined types were placed in this encounter.  No orders of  the defined types were placed in this encounter.   Face-to-face time spent with patient was *** minutes. Greater than 50% of time was spent in counseling and coordination of care.  Follow-Up Instructions: No follow-ups on file.   Earnestine Mealing, CMA  Note - This record has been created using Editor, commissioning.  Chart creation errors have been sought, but may not always  have been located. Such creation errors do not reflect on  the standard of medical care.

## 2019-12-23 LAB — NOVEL CORONAVIRUS, NAA: SARS-CoV-2, NAA: NOT DETECTED

## 2019-12-30 ENCOUNTER — Ambulatory Visit: Payer: Self-pay | Admitting: Physician Assistant

## 2020-01-04 ENCOUNTER — Other Ambulatory Visit: Payer: Self-pay

## 2020-01-04 ENCOUNTER — Emergency Department (HOSPITAL_COMMUNITY): Payer: No Typology Code available for payment source

## 2020-01-04 ENCOUNTER — Telehealth: Payer: Self-pay | Admitting: Rheumatology

## 2020-01-04 ENCOUNTER — Emergency Department (HOSPITAL_COMMUNITY)
Admission: EM | Admit: 2020-01-04 | Discharge: 2020-01-04 | Disposition: A | Discharge status: Home / Self Care | Payer: No Typology Code available for payment source | Attending: Emergency Medicine | Admitting: Emergency Medicine

## 2020-01-04 ENCOUNTER — Encounter (HOSPITAL_COMMUNITY): Payer: Self-pay | Admitting: Emergency Medicine

## 2020-01-04 DIAGNOSIS — Y939 Activity, unspecified: Secondary | ICD-10-CM | POA: Insufficient documentation

## 2020-01-04 DIAGNOSIS — Y929 Unspecified place or not applicable: Secondary | ICD-10-CM | POA: Diagnosis not present

## 2020-01-04 DIAGNOSIS — S52001A Unspecified fracture of upper end of right ulna, initial encounter for closed fracture: Secondary | ICD-10-CM | POA: Insufficient documentation

## 2020-01-04 DIAGNOSIS — M545 Low back pain: Secondary | ICD-10-CM | POA: Diagnosis not present

## 2020-01-04 DIAGNOSIS — S52101A Unspecified fracture of upper end of right radius, initial encounter for closed fracture: Secondary | ICD-10-CM | POA: Diagnosis not present

## 2020-01-04 DIAGNOSIS — S52024A Nondisplaced fracture of olecranon process without intraarticular extension of right ulna, initial encounter for closed fracture: Secondary | ICD-10-CM | POA: Insufficient documentation

## 2020-01-04 DIAGNOSIS — M25551 Pain in right hip: Secondary | ICD-10-CM | POA: Insufficient documentation

## 2020-01-04 DIAGNOSIS — W19XXXA Unspecified fall, initial encounter: Secondary | ICD-10-CM

## 2020-01-04 DIAGNOSIS — Y999 Unspecified external cause status: Secondary | ICD-10-CM | POA: Insufficient documentation

## 2020-01-04 DIAGNOSIS — S52021A Displaced fracture of olecranon process without intraarticular extension of right ulna, initial encounter for closed fracture: Secondary | ICD-10-CM

## 2020-01-04 DIAGNOSIS — S52109A Unspecified fracture of upper end of unspecified radius, initial encounter for closed fracture: Secondary | ICD-10-CM

## 2020-01-04 DIAGNOSIS — S59911A Unspecified injury of right forearm, initial encounter: Secondary | ICD-10-CM | POA: Diagnosis present

## 2020-01-04 MED ORDER — OXYCODONE HCL 5 MG PO TABS: 5.0000 mg | ORAL_TABLET | Freq: Four times a day (QID) | ORAL | 0 refills | Status: DC | PRN

## 2020-01-04 MED ORDER — ACETAMINOPHEN 325 MG PO TABS: 650.0000 mg | ORAL_TABLET | Freq: Four times a day (QID) | ORAL | Status: DC

## 2020-01-04 MED ORDER — FENTANYL CITRATE (PF) 100 MCG/2ML IJ SOLN
50.0000 ug | Freq: Once | INTRAMUSCULAR | Status: AC
  Administered 2020-01-04: 10:00:00 50 ug via INTRAVENOUS
  Filled 2020-01-04: qty 2

## 2020-01-04 MED ORDER — OXYCODONE-ACETAMINOPHEN 5-325 MG PO TABS: 2.0000 | ORAL_TABLET | ORAL | 0 refills | Status: DC | PRN

## 2020-01-04 MED ORDER — OXYCODONE-ACETAMINOPHEN 5-325 MG PO TABS
2.0000 | ORAL_TABLET | Freq: Once | ORAL | Status: AC
  Administered 2020-01-04: 2 via ORAL
  Filled 2020-01-04: qty 2

## 2020-01-04 MED ORDER — MELOXICAM 15 MG PO TABS: 15.0000 mg | ORAL_TABLET | Freq: Every day | ORAL | 1 refills | Status: DC

## 2020-01-04 MED ORDER — FENTANYL CITRATE (PF) 100 MCG/2ML IJ SOLN
100.0000 ug | Freq: Once | INTRAMUSCULAR | Status: AC
  Administered 2020-01-04: 100 ug via INTRAVENOUS
  Filled 2020-01-04: qty 2

## 2020-01-04 MED FILL — oxyCODONE HCL 5 MG TABS: 5 | 5 days supply | Qty: 20 | Fill #0

## 2020-01-04 MED FILL — MELOXICAM 15 MG TABLET: 15 | 30 days supply | Qty: 30 | Fill #0
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
## 2020-01-04 NOTE — Telephone Encounter (Signed)
I received phone call from Dr. Mack Hook.  He mentioned that Joseph [name] fell and fractured his left elbow.  It is a severe commuted fracture.  He will require surgery.  [name] on Cosentyx every 2 weeks dosing and methotrexate weekly dosing.  We discussed that he should to skip his next Cosentyx dose and have surgery towards the third or fourth week.  He should hold methotrexate 1 week prior to the surgery.  Both medications can be started  1 or 2 weeks postoperatively if there are no signs of infection and proper healing. I noticed that patient's labs are up-to-date.  He does not have a scheduled follow-up appointment.  Please schedule an appointment approximately in 2 months so he will be recovered from the surgery.  Pollyann Savoy, MD

## 2020-01-04 NOTE — ED Notes (Signed)
Patient gave permission to cut his shirt for x-rays to be performed and provided a gown. Assisted patient with removing pants.

## 2020-01-04 NOTE — ED Notes (Signed)
Provided patient two warm blankets and moist mouth swabs for his dry mouth.

## 2020-01-04 NOTE — Discharge Instructions (Signed)
It was a pleasure to meet you today. Unfortunately, you do have several fractures involving your right elbow and right wrist. The rest of the pictures from your shoulder and hip did not show any fractures though.  As you discussed with Dr. Janee Morn, you will need surgery to repair your elbow. Please follow up with them as they discussed with you.

## 2020-01-04 NOTE — ED Provider Notes (Addendum)
[facility] DEPT Provider Note   CSN: [id number] Arrival date & time: 01/04/20  4332     History Chief Complaint  Patient presents with  . right side pain    Joseph [name] is a 39 y.o. male who presented to [facility] emergency department today following a fall from a tractor trailer.  He notes that he was standing on the back of a tractor trailer at which time it suddenly took off and he fell.  Approximate height of the fall was 3 feet.  He denies hitting his head or losing consciousness.  He endorses right shoulder right elbow and right hip pain.  The right elbow pain is most prominent.  He describes a lot of pain if he tries to move it.  He was able to walk after the fall.  Past Medical History:  Diagnosis Date  . Acute sinus infection 11/07/2014  . Arthritis   . Blood transfusion without reported diagnosis    At [facility]   . Iron deficiency anemia 09/21/2014    Patient Active Problem List   Diagnosis Date Noted  . Morbid obesity with BMI of 45.0-49.9, adult ([place]) 11/25/2019  . Hair loss 12/31/2017  . Varicose veins of both lower extremities 07/26/2017  . Iron deficiency anemia 09/21/2014  . Psoriatic arthritis ([place]) 09/21/2014  . ESSENTIAL HYPERTENSION, MALIGNANT 02/18/2009  . Nonspecific (abnormal) findings on radiological and other examination of body structure 02/16/2009  . CHEST XRAY, ABNORMAL 02/16/2009    Past Surgical History:  Procedure Laterality Date  . COLONOSCOPY WITH ESOPHAGOGASTRODUODENOSCOPY (EGD)    . HERNIA REPAIR  1994  . 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 Pulmonary fibrosis Mother   . Hypertension Mother   . Pancreatic cancer Maternal Grandmother   . Rheum arthritis Paternal Aunt   . Cancer Paternal Grandmother   . Breast cancer Paternal Grandmother   . COPD Paternal Grandfather   . CAD Paternal Grandfather    . Colon cancer Neg Hx   . Prostate cancer Neg Hx   . Diabetes Neg Hx   . Thyroid disease Neg Hx     Social History   Tobacco Use  . Smoking status: Never Smoker  . Smokeless tobacco: Never Used  Substance Use Topics  . Alcohol use: No  . Drug use: No    Home Medications Prior to Admission medications   Medication Sig Start Date End Date Taking? Authorizing Provider  acetaminophen (TYLENOL) 325 MG tablet Take 2 tablets (650 mg total) by mouth every 6 (six) hours. 01/04/20   Milly Jakob, MD  clobetasol cream (TEMOVATE) 0.05 % Apply topically 2 (two) times daily as needed. 09/20/18   Bo Merino, MD  folic acid (FOLVITE) 1 MG tablet Take 1 tablet (1 mg total) by mouth daily. 09/22/19   Bo Merino, MD  meloxicam (MOBIC) 15 MG tablet Take 1 tablet (15 mg total) by mouth daily. 01/04/20   Milly Jakob, MD  methotrexate 50 MG/2ML injection Inject 0.6 mL Sub q once a week 09/20/19   Bo Merino, MD  oxyCODONE (ROXICODONE) 5 MG immediate release tablet Take 1 tablet (5 mg total) by mouth every 6 (six) hours as needed for severe pain. 01/04/20   Milly Jakob, MD  Secukinumab (COSENTYX SENSOREADY PEN) 150 MG/ML SOAJ Inject 150 mg into the skin every 14 (fourteen) days. 12/16/19  Tresa Garter, MD  Tuberculin-Allergy Syringes 27G X 1/2" 1 ML KIT To use with injectable methotrexate once a week 09/20/19   Bo Merino, MD    Allergies    Bee venom and Sulfonamide derivatives  Review of Systems   Review of Systems  Constitutional: Negative for chills and fever.  HENT: Negative.   Eyes: Negative.   Respiratory: Negative.   Cardiovascular: Negative.   Gastrointestinal: Negative.   Genitourinary: Negative.   Musculoskeletal: Positive for arthralgias and joint swelling.  Skin: Negative.   Neurological: Negative for seizures, syncope, light-headedness and numbness.  Psychiatric/Behavioral: Negative.     Physical Exam Updated Vital Signs BP (!)  143/118   Pulse 82   Temp 98.1 F (36.7 C) (Oral)   Resp 15   SpO2 98%   Physical Exam Constitutional:      General: [name] not in acute distress. HENT:     Head: Atraumatic.  Cardiovascular:     Rate and Rhythm: Normal rate and regular rhythm.  Pulmonary:     Effort: Pulmonary effort is normal.     Breath sounds: Normal breath sounds.  Abdominal:     General: Bowel sounds are normal.  Musculoskeletal:     Cervical back: Normal range of motion.     Comments: Swelling of the right elbow but no grossly apparent deformity of the right shoulder, wrist, elbow, hip. No pain with palpation of the right hip, wrist or shoulder. minimal pain with palpation of the right elbow. Right elbow effusion present. Limited ROM in the shoulder and elbow due to pain.   Skin:    Findings: No lesion.  Neurological:     General: No focal deficit present.     Mental Status: [name] alert and oriented to person, place, and time.     Sensory: No sensory deficit.     Comments: Sensation of the right upper and lower extremities intact.  Psychiatric:        Mood and Affect: Mood normal.     ED Results / Procedures / Treatments   Labs (all labs ordered are listed, but only abnormal results are displayed) Labs Reviewed - No data to display  EKG None  Radiology DG Sacrum/Coccyx  Result Date: 01/04/2020 CLINICAL DATA:  Pain following fall EXAM: SACRUM AND COCCYX - 2+ VIEW COMPARISON:  None. FINDINGS: Frontal, angled frontal, and lateral views were obtained. No evident acute fracture or diastasis. Joint spaces appear normal. No erosive change. Apparent old fracture right ischium. IMPRESSION: No acute fracture or diastasis. No appreciable arthropathy. Apparent old fracture right ischium. Electronically Signed   By: Lowella Grip III M.D.   On: 01/04/2020 10:36   DG Shoulder Right  Result Date: 01/04/2020 CLINICAL DATA:  Pain following fall EXAM: RIGHT SHOULDER - 2+ VIEW COMPARISON:  None. FINDINGS:  Frontal and Y scapular images obtained. No appreciable fracture or dislocation. There is mild generalized joint space narrowing. No erosive change. Visualized right lung clear. IMPRESSION: No evident fracture or dislocation. Mild generalized joint space narrowing. Electronically Signed   By: Lowella Grip III M.D.   On: 01/04/2020 10:33   DG Elbow Complete Right  Result Date: 01/04/2020 CLINICAL DATA:  Fall with right arm injury.  Initial encounter. EXAM: RIGHT ELBOW - COMPLETE 3+ VIEW COMPARISON:  None. FINDINGS: Severely comminuted and displaced olecranon process and coronoid process fractures with retraction of the olecranon process. The radial head is subluxed laterally and likely fractured. IMPRESSION: 1. Severely displaced and distracted proximal ulna  fracture. 2. Radial head subluxation and probable fracture. Electronically Signed   By: Monte Fantasia M.D.   On: 01/04/2020 10:31   DG Wrist Complete Right  Result Date: 01/04/2020 CLINICAL DATA:  Pain following fall EXAM: RIGHT WRIST - COMPLETE 3+ VIEW COMPARISON:  None. FINDINGS: Frontal, oblique, and lateral views were obtained. There is a fracture in the region of the radial styloid. Several small fracture fragments are noted along the dorsal aspect of the distal radius. There is avulsion of the ulnar styloid. No dislocation. Joint spaces appear normal. No erosive change. IMPRESSION: Fracture along the radial styloid with several small avulse fragments dorsally. Avulsion ulnar styloid. No dislocation. Joint spaces unremarkable. Electronically Signed   By: Lowella Grip III M.D.   On: 01/04/2020 10:32   DG Hip Unilat With Pelvis 2-3 Views Right  Result Date: 01/04/2020 CLINICAL DATA:  Pain following fall EXAM: DG HIP (WITH OR WITHOUT PELVIS) 2-3V RIGHT COMPARISON:  None. FINDINGS: Frontal pelvis as well as frontal and lateral right hip images obtained. There is evidence of an apparent old fracture of the right ischium. No acute appearing  fracture or dislocation evident. Joint spaces appear normal. No erosive change. IMPRESSION: Apparent old fracture of the right ischium. No acute fracture or dislocation evident. No appreciable arthropathy. Electronically Signed   By: Lowella Grip III M.D.   On: 01/04/2020 10:35     Medications Ordered in ED Medications  fentaNYL (SUBLIMAZE) injection 50 mcg (50 mcg Intravenous Given 01/04/20 1001)  fentaNYL (SUBLIMAZE) injection 100 mcg (100 mcg Intravenous Given 01/04/20 1153)  oxyCODONE-acetaminophen (PERCOCET/ROXICET) 5-325 MG per tablet 2 tablet (2 tablets Oral Given 01/04/20 1338)    ED Course  I have reviewed the triage vital signs and the nursing notes.  Pertinent labs & imaging results that were available during my care of the patient were reviewed by me and considered in my medical decision making (see chart for details).    MDM Rules/Calculators/A&P                      1:45 PM initial assessment: 39 year old male presenting via EMS for a fall from a tractor trailer with a height of approximately 3 feet.  Primary complaint is right shoulder, elbow, wrist, hip pain. Will obtain xrays to evaluate for fx vs dislocation. Patient denies hitting his head or LOC so I don't think head CT is necessary at this time.  1:45 PM imaging significant for severely displaced and distracted proximal ulna fx, avulsion of ulnar styloid, radial head subluxation with probable fx, radial styloid with several small avulsion fragments dorsally, severely comminuted and displaced olecranon process and coronoid process fx with retraction of olecranon process. Attempted to reach out to orthopedics. Spoke with on call ortho staff who referred me to Dr. Grandville Silos since there is also wrist involvement. No answer from Dr. Grandville Silos at this time. Will continue to try to reach out.  1:45 PM patient and his wife (via telephone) updated on findings. Pain 10/10 as well. Will give another dose of fentanyl.   1:45 PM  Evaluated by Dr. Grandville Silos from ortho. Will need repair of the elbow but likely not the wrist. Given the patient's history of psoriatic arthritis being treated on cosentyx and MTX, they will need to discuss with rheumatologist prior to scheduling repair. Recommending placement of LAS after which time he can be discharged home.  1:45 PM reassessed. Splint in place. Appears much more comfortable. Should be stable for discharge if pain  can be well controlled on po meds.  Final Clinical Impression(s) / ED Diagnoses Final diagnoses:  Fall, initial encounter  Closed fracture of olecranon process of right ulna, initial encounter  Closed fracture of proximal end of right ulna, unspecified fracture morphology, initial encounter  Closed fracture of proximal end of radius, unspecified fracture morphology, unspecified laterality, initial encounter    Rx / DC Orders ED Discharge Orders         Ordered    oxyCODONE (ROXICODONE) 5 MG immediate release tablet  Every 6 hours PRN     01/04/20 1303    meloxicam (MOBIC) 15 MG tablet  Daily     01/04/20 1303    acetaminophen (TYLENOL) 325 MG tablet  Every 6 hours     01/04/20 1303    oxyCODONE-acetaminophen (PERCOCET) 5-325 MG tablet  Every 4 hours PRN,   Status:  Discontinued     01/04/20 [place], Corley Kohls, MD 01/04/20 [place], Norvil Martensen, MD 01/04/20 [place], Wenda Overland, MD 01/05/20 2012

## 2020-01-05 NOTE — Telephone Encounter (Signed)
Patient has a follow up visit scheduled for 02/03/20.

## 2020-01-11 ENCOUNTER — Encounter (HOSPITAL_BASED_OUTPATIENT_CLINIC_OR_DEPARTMENT_OTHER): Payer: Self-pay | Admitting: Orthopedic Surgery

## 2020-01-11 ENCOUNTER — Other Ambulatory Visit: Payer: Self-pay

## 2020-01-11 ENCOUNTER — Other Ambulatory Visit: Payer: Self-pay | Admitting: Orthopedic Surgery

## 2020-01-11 ENCOUNTER — Other Ambulatory Visit (HOSPITAL_COMMUNITY)
Admission: RE | Admit: 2020-01-11 | Discharge: 2020-01-11 | Disposition: A | Discharge status: Home / Self Care | Payer: No Typology Code available for payment source | Source: Ambulatory Visit | Attending: Orthopedic Surgery | Admitting: Orthopedic Surgery

## 2020-01-11 DIAGNOSIS — Z01812 Encounter for preprocedural laboratory examination: Secondary | ICD-10-CM | POA: Diagnosis not present

## 2020-01-11 DIAGNOSIS — Z20822 Contact with and (suspected) exposure to covid-19: Secondary | ICD-10-CM | POA: Insufficient documentation

## 2020-01-11 LAB — SARS CORONAVIRUS 2 (TAT 6-24 HRS): SARS Coronavirus 2: NEGATIVE

## 2020-01-12 ENCOUNTER — Other Ambulatory Visit: Payer: Self-pay | Admitting: Orthopedic Surgery

## 2020-01-13 ENCOUNTER — Ambulatory Visit (HOSPITAL_BASED_OUTPATIENT_CLINIC_OR_DEPARTMENT_OTHER)
Admission: RE | Admit: 2020-01-13 | Discharge: 2020-01-13 | Disposition: A | Discharge status: Home / Self Care | Payer: No Typology Code available for payment source | Source: Ambulatory Visit | Attending: Orthopedic Surgery | Admitting: Orthopedic Surgery

## 2020-01-13 ENCOUNTER — Ambulatory Visit (HOSPITAL_BASED_OUTPATIENT_CLINIC_OR_DEPARTMENT_OTHER): Payer: No Typology Code available for payment source | Admitting: Certified Registered Nurse Anesthetist

## 2020-01-13 ENCOUNTER — Other Ambulatory Visit: Payer: Self-pay

## 2020-01-13 ENCOUNTER — Encounter (HOSPITAL_BASED_OUTPATIENT_CLINIC_OR_DEPARTMENT_OTHER): Payer: Self-pay | Admitting: Orthopedic Surgery

## 2020-01-13 ENCOUNTER — Encounter (HOSPITAL_BASED_OUTPATIENT_CLINIC_OR_DEPARTMENT_OTHER)
Admission: RE | Disposition: A | Discharge status: Home / Self Care | Payer: Self-pay | Source: Ambulatory Visit | Attending: Orthopedic Surgery

## 2020-01-13 ENCOUNTER — Ambulatory Visit (HOSPITAL_COMMUNITY): Payer: No Typology Code available for payment source

## 2020-01-13 DIAGNOSIS — Z6841 Body Mass Index (BMI) 40.0 and over, adult: Secondary | ICD-10-CM | POA: Diagnosis not present

## 2020-01-13 DIAGNOSIS — Z79899 Other long term (current) drug therapy: Secondary | ICD-10-CM | POA: Diagnosis not present

## 2020-01-13 DIAGNOSIS — Z419 Encounter for procedure for purposes other than remedying health state, unspecified: Secondary | ICD-10-CM

## 2020-01-13 DIAGNOSIS — S52001A Unspecified fracture of upper end of right ulna, initial encounter for closed fracture: Secondary | ICD-10-CM | POA: Diagnosis present

## 2020-01-13 DIAGNOSIS — I10 Essential (primary) hypertension: Secondary | ICD-10-CM | POA: Insufficient documentation

## 2020-01-13 DIAGNOSIS — L4050 Arthropathic psoriasis, unspecified: Secondary | ICD-10-CM | POA: Diagnosis not present

## 2020-01-13 DIAGNOSIS — E669 Obesity, unspecified: Secondary | ICD-10-CM | POA: Insufficient documentation

## 2020-01-13 DIAGNOSIS — T884XXA Failed or difficult intubation, initial encounter: Secondary | ICD-10-CM

## 2020-01-13 HISTORY — PX: ORIF ULNAR FRACTURE: SHX5417

## 2020-01-13 HISTORY — DX: Failed or difficult intubation, initial encounter: T88.4XXA

## 2020-01-13 HISTORY — PX: LIGAMENT REPAIR: SHX5444

## 2020-01-13 SURGERY — OPEN REDUCTION INTERNAL FIXATION (ORIF) ULNAR FRACTURE
Anesthesia: Regional | Site: Elbow | Laterality: Right

## 2020-01-13 MED ORDER — 0.9 % SODIUM CHLORIDE (POUR BTL) OPTIME
TOPICAL | Status: DC | PRN
  Administered 2020-01-13: 12:00:00 200 mL

## 2020-01-13 MED ORDER — FENTANYL CITRATE (PF) 100 MCG/2ML IJ SOLN
50.0000 ug | INTRAMUSCULAR | Status: DC | PRN
  Administered 2020-01-13: 100 ug via INTRAVENOUS

## 2020-01-13 MED ORDER — PROPOFOL 10 MG/ML IV BOLUS
INTRAVENOUS | Status: DC | PRN
  Administered 2020-01-13: 200 mg via INTRAVENOUS

## 2020-01-13 MED ORDER — PROPOFOL 500 MG/50ML IV EMUL
INTRAVENOUS | Status: AC
  Filled 2020-01-13: qty 50

## 2020-01-13 MED ORDER — DEXTROSE 5 % IV SOLN
3.0000 g | Freq: Once | INTRAVENOUS | Status: AC
  Administered 2020-01-13: 3 g via INTRAVENOUS

## 2020-01-13 MED ORDER — FENTANYL CITRATE (PF) 100 MCG/2ML IJ SOLN
INTRAMUSCULAR | Status: AC
  Filled 2020-01-13: qty 2

## 2020-01-13 MED ORDER — ARTIFICIAL TEARS OPHTHALMIC OINT
TOPICAL_OINTMENT | OPHTHALMIC | Status: AC
  Filled 2020-01-13: qty 3.5

## 2020-01-13 MED ORDER — CEFAZOLIN SODIUM-DEXTROSE 2-4 GM/100ML-% IV SOLN
INTRAVENOUS | Status: AC
  Filled 2020-01-13: qty 100

## 2020-01-13 MED ORDER — COSENTYX SENSOREADY PEN 150 MG/ML ~~LOC~~ SOAJ: 150.0000 mg | SUBCUTANEOUS | 0 refills | Status: DC

## 2020-01-13 MED ORDER — FENTANYL CITRATE (PF) 100 MCG/2ML IJ SOLN
INTRAMUSCULAR | Status: DC | PRN
  Administered 2020-01-13 (×4): 50 ug via INTRAVENOUS

## 2020-01-13 MED ORDER — GLYCOPYRROLATE PF 0.2 MG/ML IJ SOSY
PREFILLED_SYRINGE | INTRAMUSCULAR | Status: AC
  Filled 2020-01-13: qty 1

## 2020-01-13 MED ORDER — ARTIFICIAL TEARS OPHTHALMIC OINT
TOPICAL_OINTMENT | OPHTHALMIC | Status: DC | PRN
  Administered 2020-01-13: 1 via OPHTHALMIC

## 2020-01-13 MED ORDER — MEPERIDINE HCL 25 MG/ML IJ SOLN: 6.2500 mg | INTRAMUSCULAR | Status: DC | PRN

## 2020-01-13 MED ORDER — HYDROCODONE-ACETAMINOPHEN 7.5-325 MG PO TABS: 1.0000 | ORAL_TABLET | Freq: Once | ORAL | Status: DC | PRN

## 2020-01-13 MED ORDER — FENTANYL CITRATE (PF) 100 MCG/2ML IJ SOLN: 25.0000 ug | INTRAMUSCULAR | Status: DC | PRN

## 2020-01-13 MED ORDER — PHENYLEPHRINE 40 MCG/ML (10ML) SYRINGE FOR IV PUSH (FOR BLOOD PRESSURE SUPPORT)
PREFILLED_SYRINGE | INTRAVENOUS | Status: AC
  Filled 2020-01-13: qty 10

## 2020-01-13 MED ORDER — PHENYLEPHRINE HCL (PRESSORS) 10 MG/ML IV SOLN
INTRAVENOUS | Status: DC | PRN
  Administered 2020-01-13: 120 ug via INTRAVENOUS

## 2020-01-13 MED ORDER — ONDANSETRON HCL 4 MG/2ML IJ SOLN: 4.0000 mg | Freq: Once | INTRAMUSCULAR | Status: DC | PRN

## 2020-01-13 MED ORDER — MIDAZOLAM HCL 2 MG/2ML IJ SOLN
INTRAMUSCULAR | Status: AC
  Filled 2020-01-13: qty 2

## 2020-01-13 MED ORDER — ROCURONIUM BROMIDE 10 MG/ML (PF) SYRINGE
PREFILLED_SYRINGE | INTRAVENOUS | Status: AC
  Filled 2020-01-13: qty 10

## 2020-01-13 MED ORDER — CEFAZOLIN SODIUM-DEXTROSE 2-4 GM/100ML-% IV SOLN: 2.0000 g | INTRAVENOUS | Status: DC

## 2020-01-13 MED ORDER — LACTATED RINGERS IV SOLN: INTRAVENOUS | Status: DC

## 2020-01-13 MED ORDER — LIDOCAINE 2% (20 MG/ML) 5 ML SYRINGE
INTRAMUSCULAR | Status: AC
  Filled 2020-01-13: qty 5

## 2020-01-13 MED ORDER — LIDOCAINE HCL (CARDIAC) PF 100 MG/5ML IV SOSY
PREFILLED_SYRINGE | INTRAVENOUS | Status: DC | PRN
  Administered 2020-01-13: 50 mg via INTRATRACHEAL

## 2020-01-13 MED ORDER — METHOTREXATE SODIUM CHEMO INJECTION 50 MG/2ML: INTRAMUSCULAR | 0 refills | Status: DC

## 2020-01-13 MED ORDER — ONDANSETRON HCL 4 MG/2ML IJ SOLN
INTRAMUSCULAR | Status: AC
  Filled 2020-01-13: qty 2

## 2020-01-13 MED ORDER — CEFAZOLIN SODIUM-DEXTROSE 1-4 GM/50ML-% IV SOLN
INTRAVENOUS | Status: AC
  Filled 2020-01-13: qty 50

## 2020-01-13 MED ORDER — ONDANSETRON HCL 4 MG/2ML IJ SOLN
INTRAMUSCULAR | Status: DC | PRN
  Administered 2020-01-13: 4 mg via INTRAVENOUS

## 2020-01-13 MED ORDER — ROPIVACAINE HCL 5 MG/ML IJ SOLN
INTRAMUSCULAR | Status: DC | PRN
  Administered 2020-01-13: 30 mL via PERINEURAL

## 2020-01-13 MED ORDER — DEXAMETHASONE SODIUM PHOSPHATE 10 MG/ML IJ SOLN
INTRAMUSCULAR | Status: DC | PRN
  Administered 2020-01-13: 10 mg via INTRAVENOUS
  Administered 2020-01-13: 5 mg via INTRAVENOUS

## 2020-01-13 MED ORDER — MIDAZOLAM HCL 2 MG/2ML IJ SOLN
1.0000 mg | INTRAMUSCULAR | Status: DC | PRN
  Administered 2020-01-13: 2 mg via INTRAVENOUS

## 2020-01-13 MED ORDER — ROCURONIUM BROMIDE 100 MG/10ML IV SOLN
INTRAVENOUS | Status: DC | PRN
  Administered 2020-01-13: 70 mg via INTRAVENOUS
  Administered 2020-01-13: 30 mg via INTRAVENOUS

## 2020-01-13 MED ORDER — SUGAMMADEX SODIUM 500 MG/5ML IV SOLN
INTRAVENOUS | Status: DC | PRN
  Administered 2020-01-13: 268.8 mg via INTRAVENOUS

## 2020-01-13 MED ORDER — CHLORHEXIDINE GLUCONATE 4 % EX LIQD: 60.0000 mL | Freq: Once | CUTANEOUS | Status: DC

## 2020-01-13 MED ORDER — GLYCOPYRROLATE 0.2 MG/ML IJ SOLN
INTRAMUSCULAR | Status: DC | PRN
  Administered 2020-01-13: .2 mg via INTRAVENOUS

## 2020-01-13 MED ORDER — SUGAMMADEX SODIUM 500 MG/5ML IV SOLN
INTRAVENOUS | Status: AC
  Filled 2020-01-13: qty 5

## 2020-01-13 MED ORDER — MIDAZOLAM HCL 2 MG/2ML IJ SOLN
INTRAMUSCULAR | Status: DC | PRN
  Administered 2020-01-13 (×2): 1 mg via INTRAVENOUS

## 2020-01-13 SURGICAL SUPPLY — 72 items
BIT DRILL 2.5X2.75 QC CALB (BIT) ×3 IMPLANT
BIT DRILL CALIBRATED 2.7 (BIT) ×2 IMPLANT
BIT DRILL CALIBRATED 2.7MM (BIT) ×1
BLADE SURG 15 STRL LF DISP TIS (BLADE) ×2 IMPLANT
BLADE SURG 15 STRL SS (BLADE) ×6
BNDG COHESIVE 4X5 TAN STRL (GAUZE/BANDAGES/DRESSINGS) ×3 IMPLANT
BNDG ESMARK 4X9 LF (GAUZE/BANDAGES/DRESSINGS) ×3 IMPLANT
BNDG GAUZE ELAST 4 BULKY (GAUZE/BANDAGES/DRESSINGS) ×3 IMPLANT
CANISTER SUCT 1200ML W/VALVE (MISCELLANEOUS) ×3 IMPLANT
CHLORAPREP W/TINT 26 (MISCELLANEOUS) ×3 IMPLANT
CORD BIPOLAR FORCEPS 12FT (ELECTRODE) ×3 IMPLANT
COVER BACK TABLE 60X90IN (DRAPES) ×3 IMPLANT
COVER MAYO STAND STRL (DRAPES) ×3 IMPLANT
CUFF TOURN SGL QUICK 24 (TOURNIQUET CUFF) ×3
CUFF TRNQT CYL 24X4X16.5-23 (TOURNIQUET CUFF) ×1 IMPLANT
DRAPE C-ARM 42X72 X-RAY (DRAPES) ×3 IMPLANT
DRAPE C-ARMOR (DRAPES) ×3 IMPLANT
DRAPE EXTREMITY T 121X128X90 (DISPOSABLE) ×3 IMPLANT
DRAPE SURG 17X23 STRL (DRAPES) ×3 IMPLANT
DRAPE U-SHAPE 47X51 STRL (DRAPES) ×3 IMPLANT
DRSG ADAPTIC 3X8 NADH LF (GAUZE/BANDAGES/DRESSINGS) ×3 IMPLANT
GAUZE SPONGE 4X4 12PLY STRL LF (GAUZE/BANDAGES/DRESSINGS) ×3 IMPLANT
GLOVE BIO SURGEON STRL SZ7.5 (GLOVE) ×6 IMPLANT
GLOVE BIOGEL PI IND STRL 7.0 (GLOVE) ×2 IMPLANT
GLOVE BIOGEL PI IND STRL 8 (GLOVE) ×1 IMPLANT
GLOVE BIOGEL PI INDICATOR 7.0 (GLOVE) ×4
GLOVE BIOGEL PI INDICATOR 8 (GLOVE) ×2
GLOVE ECLIPSE 6.5 STRL STRAW (GLOVE) ×3 IMPLANT
GOWN STRL REUS W/ TWL LRG LVL3 (GOWN DISPOSABLE) ×2 IMPLANT
GOWN STRL REUS W/TWL LRG LVL3 (GOWN DISPOSABLE) ×6
GOWN STRL REUS W/TWL XL LVL3 (GOWN DISPOSABLE) ×3 IMPLANT
K-WIRE .062X4 (WIRE) ×3 IMPLANT
K-WIRE FIXATION 2.0X6 (WIRE) ×3
KWIRE FIXATION 2.0X6 (WIRE) ×1 IMPLANT
NS IRRIG 1000ML POUR BTL (IV SOLUTION) ×3 IMPLANT
PACK BASIN DAY SURGERY FS (CUSTOM PROCEDURE TRAY) ×3 IMPLANT
PADDING CAST ABS 4INX4YD NS (CAST SUPPLIES) ×2
PADDING CAST ABS COTTON 4X4 ST (CAST SUPPLIES) ×1 IMPLANT
PENCIL SMOKE EVACUATOR (MISCELLANEOUS) ×3 IMPLANT
PLATE OLECRANON LRG (Plate) ×3 IMPLANT
RETRIEVER SUT HEWSON (MISCELLANEOUS) ×3 IMPLANT
SCREW CORT 3.5X32 (Screw) ×3 IMPLANT
SCREW CORT 3.5X40 (Screw) ×3 IMPLANT
SCREW CORT T15 28X3.5XST LCK (Screw) ×1 IMPLANT
SCREW CORT T15 32X3.5XST LCK (Screw) ×1 IMPLANT
SCREW CORTICAL 3.5X28MM (Screw) ×3 IMPLANT
SCREW LOCK CORT STAR 3.5X16 (Screw) ×6 IMPLANT
SCREW LOCK CORT STAR 3.5X18 (Screw) ×3 IMPLANT
SCREW LOCK CORT STAR 3.5X20 (Screw) ×3 IMPLANT
SCREW LOCK CORT STAR 3.5X22 (Screw) ×3 IMPLANT
SCREW LOCK CORT STAR 3.5X28 (Screw) ×3 IMPLANT
SCREW LOW PROFILE 3.5X48MM (Screw) ×6 IMPLANT
SCREW LP 3.5X38MM (Screw) ×3 IMPLANT
SLEEVE SCD COMPRESS KNEE MED (MISCELLANEOUS) ×3 IMPLANT
SLING ARM FOAM STRAP XLG (SOFTGOODS) ×3 IMPLANT
SPLINT FIBERGLASS 4X30 (CAST SUPPLIES) ×3 IMPLANT
SPONGE LAP 18X18 RF (DISPOSABLE) ×3 IMPLANT
STAPLER VISISTAT 35W (STAPLE) ×3 IMPLANT
STOCKINETTE 6  STRL (DRAPES) ×3
STOCKINETTE 6 STRL (DRAPES) ×1 IMPLANT
SUCTION FRAZIER HANDLE 10FR (MISCELLANEOUS) ×3
SUCTION TUBE FRAZIER 10FR DISP (MISCELLANEOUS) ×1 IMPLANT
SUT VIC AB 0 CT3 27 (SUTURE) ×6 IMPLANT
SUT VIC AB 2-0 CT3 27 (SUTURE) ×9 IMPLANT
SUT VICRYL 4-0 PS2 18IN ABS (SUTURE) IMPLANT
SUT VICRYL RAPIDE 4/0 PS 2 (SUTURE) ×3 IMPLANT
SYR BULB 3OZ (MISCELLANEOUS) ×3 IMPLANT
TOWEL GREEN STERILE FF (TOWEL DISPOSABLE) ×3 IMPLANT
TUBE CONNECTING 20'X1/4 (TUBING) ×1
TUBE CONNECTING 20X1/4 (TUBING) ×2 IMPLANT
UNDERPAD 30X36 HEAVY ABSORB (UNDERPADS AND DIAPERS) ×3 IMPLANT
WASHER 3.5MM (Orthopedic Implant) ×3 IMPLANT

## 2020-01-13 NOTE — Interval H&P Note (Signed)
History and Physical Interval Note:  01/13/2020 7:31 AM  Joseph [name]  has presented today for surgery, with the diagnosis of RIGHT COMPLEX ELBOW TRAUMA.  The various methods of treatment have been discussed with the patient and family. After consideration of risks, benefits and other options for treatment, the patient has consented to  Procedure(s) with comments: OPEN TREATMENT OF COMPLEX RIGHT PROXIMAL ULNA FRACTURE WITH POSSIBLE COLLATERAL LIGAMENT REPAIRS (Right) - PROCEDURE: OPEN TREATMENT OF COMPLEX RIGHT PROXIMAL ULNA FRACTURE WITH POSSIBLE COLLATERAL LIGAMENT REPAIRS  LENGTH OF SURGERY: 2.5 HOURS MAC + REGIONAL BLOCK as a surgical intervention.  The patient's history has been reviewed, patient examined, no change in status, stable for surgery.  I have reviewed the patient's chart and labs.  Questions were answered to the patient's satisfaction.     Jolyn Nap

## 2020-01-13 NOTE — Anesthesia Preprocedure Evaluation (Addendum)
Anesthesia Evaluation  Patient identified by MRN, date of birth, ID band Patient awake    Reviewed: Allergy & Precautions, NPO status , Patient's Chart, lab work & pertinent test results  History of Anesthesia Complications (+) PONV  Airway Mallampati: III  TM Distance: >3 FB Neck ROM: Full    Dental no notable dental hx. (+) Teeth Intact   Pulmonary neg pulmonary ROS,    Pulmonary exam normal breath sounds clear to auscultation       Cardiovascular hypertension, Normal cardiovascular exam Rhythm:Regular Rate:Normal     Neuro/Psych negative neurological ROS  negative psych ROS   GI/Hepatic negative GI ROS, Neg liver ROS,   Endo/Other  Morbid obesity  Renal/GU negative Renal ROS  negative genitourinary   Musculoskeletal  (+) Arthritis , Psoriatic arthritis Fx right proximal ulna   Abdominal (+) + obese,   Peds  Hematology  (+) anemia ,   Anesthesia Other Findings   Reproductive/Obstetrics                            Anesthesia Physical Anesthesia Plan  ASA: III  Anesthesia Plan: Regional and MAC   Post-op Pain Management:    Induction: Intravenous  PONV Risk Score and Plan: 1 and Treatment may vary due to age or medical condition, Propofol infusion, Ondansetron and Midazolam  Airway Management Planned: Natural Airway, Simple Face Mask and Nasal Cannula  Additional Equipment:   Intra-op Plan:   Post-operative Plan:   Informed Consent: I have reviewed the patients History and Physical, chart, labs and discussed the procedure including the risks, benefits and alternatives for the proposed anesthesia with the patient or authorized representative who has indicated his/her understanding and acceptance.     Dental advisory given  Plan Discussed with: CRNA and Surgeon  Anesthesia Plan Comments:         Anesthesia Quick Evaluation

## 2020-01-13 NOTE — Anesthesia Postprocedure Evaluation (Signed)
Anesthesia Post Note  Patient: Joseph [name]  Procedure(s) Performed: OPEN TREATMENT OF COMPLEX RIGHT PROXIMAL ULNA FRACTURE WITH POSSIBLE COLLATERAL LIGAMENT REPAIRS (Right Elbow)     Patient location during evaluation: PACU Anesthesia Type: Regional and General Level of consciousness: awake and alert and oriented Pain management: pain level controlled Vital Signs Assessment: post-procedure vital signs reviewed and stable Respiratory status: spontaneous breathing, nonlabored ventilation and respiratory function stable Cardiovascular status: blood pressure returned to baseline and stable Postop Assessment: no apparent nausea or vomiting Anesthetic complications: no Comments: Block inadequate for surgery. GETA induced. Patient was difficult intubation. Intubated using Glidescope with T4 blade. Patient informed of difficult intubation and that he would be receiving letter in mail.    Last Vitals:  Vitals:   01/13/20 1245 01/13/20 1300  BP: 114/84 113/78  Pulse: (!) 101 (!) 101  Resp: (!) 26 (!) 22  Temp:    SpO2: 97% 96%    Last Pain:  Vitals:   01/13/20 0722  TempSrc: Oral  PainSc: 5                  Promiss Labarbera A.

## 2020-01-13 NOTE — Op Note (Signed)
01/13/2020  7:31 AM  PATIENT:  Joseph [name]  39 y.o. male  [name]  POST-OPERATIVE DIAGNOSIS:  Same  PROCEDURE:   1.  ORIF right proximal ulna fracture    2.  Right elbow lateral collateral ligament repair     3.  Right elbow medial collateral ligament repair  SURGEON: Rayvon Char. Grandville Silos, MD  PHYSICIAN ASSISTANT: Morley Kos, OPA-C  ANESTHESIA:  regional and general  SPECIMENS:  None  DRAINS:   None  EBL:  less than 100 mL  PREOPERATIVE INDICATIONS:  Joseph [name] is a  38 y.o. male with a closed, highly comminuted right proximal ulna fracture  The risks benefits and alternatives were discussed with the patient preoperatively including but not limited to the risks of infection, bleeding, nerve injury, cardiopulmonary complications, the need for revision surgery, among others, and the patient verbalized understanding and consented to proceed.  OPERATIVE IMPLANTS: Biomet ALPS 13-hole olecranon plate/screws  OPERATIVE PROCEDURE:  After receiving prophylactic antibiotics and a regional block, the patient was escorted to the operative theatre and placed in a supine position.  He did not tolerate rolling from the supine position, so MAC was aborted in favor of general anesthetic.  Once an endotracheal tube was placed and general anesthetic administered, he was repositioned into floppy lateral on a beanbag, right side up with care to pad the appropriate pressure points and axillary roll was placed.  A surgical "time-out" was performed during which the planned procedure, proposed operative site, and the correct patient identity were compared to the operative consent and agreement confirmed by the circulating nurse according to current facility policy.  His limb had been prescribed in the holding area.  It was further prepped with ChloraPrep and then draped in usual sterile fashion.  Sterile tourniquet was applied but not  inflated.  A posterior approach was marked, curving just lateral to the prominence of the olecranon.  Limb was exsanguinated with an Esmarch bandage and tourniquet inflated to 250 mmHg.  A straight posterior approach was then made deeply, elevating full-thickness flaps.  The musculature and fascia had already become compromised and split in the region of the comminution.  This was developed proximally and distally in the midline, elevating distally the periosteum off the ulna.  The zone of comminution that had multiple fragments was then irrigated and clot removed.  The lateral collateral ligament complex and annular ligament had been disrupted from its ulnar attachment.  On the medial side there was medial wall blowout fragment and multiple comminuted pieces there, couple of which contained the medial collateral ligament insertions.  The olecranon major fragment was then reduced to the shaft and ultimately secured with an olecranon plate, longitudinally dividing the triceps insertion to allow for the proximal plate extension to fit more snugly against the bones contour.  It was quite difficult to keep reduction while applying this, and the use of provisional fixation was instrumental.  However once it was applied, proximal fixation was achieved with the 2 screws it typically traversed the olecranon and first temporary wires in the most 2 proximal plate extension holes that were later replaced with nonlocking screws.  Once this construct was applied and overall shape was reconstituted, with the triceps now being in essence tied to the forearm, effort was begun to further place some of the blowout fragments and secure them, with the 2 tabs that are medial and lateral on the plate.  There was a large medial blowout  fragment which contained essentially the medial cortex throughout the whole roundness of the curve of the trochlea throughout the whole roundness of the curve of the trochlea.  This was replaced and  secured with the screw through the lateral medial tab extension.  After this, there was a deeper fragment that was secured from the contralateral side with a screw in the other tab.  These tabs had to be redirected slightly with plate benders to allow for the proper trajectory of the screws.  All the while, provisional fluoroscopic assessment was performed as well as visual inspection to ensure that screws were not in the joint space.  Ultimately, the most proximal 1 in the olecranon plate was removed as it traversed the joint adjacent to the radial head to closely, even though it was in a comminuted area that lacked articular cartilage coverage, I was concerned it was closed and removed it.  There was good restoration of articular alignment, the coronoid fragments were found to be small and multiple, but at least the base fragment was held to the construct with the screws to the plate.  The radial collateral ligament complex had been stripped of the ulna, and suture was placed into it and ultimately through drill holes, tying on the opposite side across the medial wall fragment.  Once this was done, stability of the radial head was much improved.  Prior to that there was posterior lateral rotatory instability and it was corrected with this.  On the medial side, the collateral ligaments were fine at the humeral level, and the fragment at the sublime tubercle was not effectively yet tied into the construct.  Suture was placed into them, and brought through drill holes again tied on the lateral side of the olecranon, restoring attachment of these ligaments.  Once the medial and lateral sided ligaments have been addressed in this manner, the elbow stayed reduced, not subluxing until the last 30 to 40 degrees of extension.  Tourniquet was released, final fluoroscopic images obtained, and 2-0 Vicryl suture was used to reapproximate the more distal fascial split.  The full-thickness skin flaps were then reapproximated  with 2-0 Vicryl deep dermal subcuticular buried sutures and the skin with staples.  A long-arm splint dressing was applied, extending all the way to the fifth metacarpal neck, with the forearm in neutral rotation and supporting the wrist fracture in the process.  A sling was applied.  He was awakened and taken to the recovery room in stable condition, breathing spontaneously  DISPOSITION: He will be discharged home today with typical instructions, returning in 10 to 15 days.  At that time, we will remove the splint carefully, keeping the elbow flexed to 90 degrees, inspect the wound, address staple removal of the wound appears appropriate, and obtain new x-rays of the right elbow, keeping the elbow flexed to 90.  This should be 3 views, essentially an AP of the distal humerus, AP of the proximal forearm, and lateral.  We may return into the splint for an additional period of time before beginning motion out of concern for the size of the limb and the propensity for instability.

## 2020-01-13 NOTE — Discharge Instructions (Addendum)
January 13, 2020  Joseph [name] [address] [place] [address]   Dear Mr. Shellhammer,   I hope you are doing well after your surgery.  Included with this note is the letter you should have received after your surgery. I think it is always a good idea to let the anesthesia team know about previous difficult intubations.  Thank you for communicating it with [place].  As we discussed in the holding area prior to you surgery, I was able to review the previous records and did not need to read the letter.  We modified our intubation strategy with the information from the previous surgery and were able to intubate you without difficulty.  I mentioned this to you in the recovery room, but you may not remember our discussion. We used a "Glidescope" for the procedure and our technique is documented in our anesthetic record.  This record is available should others need to review it. If you have any questions about the procedure, please let me know and I will be happy to help.    During your recent procedure at [facility], your anesthesia team determined that you were a difficult intubation.  This means that there was difficulty in placing a breathing tube from your mouth into your windpipe.  This procedure is commonly performed for general anesthetics for the purpose of providing oxygen and anesthetic gases during the operation.  Routinely, this is done using an instrument, known as a laryngoscope, to visualize the vocal cords and directly place the breathing tube into the trachea.  It is very important for you to tell your Anesthesiologist when you have future operations, that you are a difficult intubation so that other arrangements, personnel, or instruments can be obtained to ensure your safety.   We recommend that you obtain and wear a Medical Alert Bracelet in case an emergency arises.  There are many companies who will sell you such a bracelet in many styles.  They are easily found on the  internet by searching "Medical Alert Bracelet."  Yours should have the words "Difficult Intubation" written on the bracelet.   It was a pleasure to take care of you for your surgery, and I hope the best for you in the future. Should you have any questions, please feel free to contact me at [phone].   FOSTER,MICHAEL A.            Post Anesthesia Home Care Instructions  Activity: Get plenty of rest for the remainder of the day. A responsible individual must stay with you for 24 hours following the procedure.  For the next 24 hours, DO NOT: -Drive a car -Advertising copywriter -Drink alcoholic beverages -Take any medication unless instructed by your physician -Make any legal decisions or sign important papers.  Meals: Start with liquid foods such as gelatin or soup. Progress to regular foods as tolerated. Avoid greasy, spicy, heavy foods. If nausea and/or vomiting occur, drink only clear liquids until the nausea and/or vomiting subsides. Call your physician if vomiting continues.  Special Instructions/Symptoms: Your throat may feel dry or sore from the anesthesia or the breathing tube placed in your throat during surgery. If this causes discomfort, gargle with warm salt water. The discomfort should disappear within 24 hours.  If you had a scopolamine patch placed behind your ear for the management of post- operative nausea and/or vomiting:  1. The medication in the patch is effective for 72 hours, after which it should be removed.  Wrap patch in a tissue and discard in the trash. Wash hands thoroughly with soap and water. 2. You may remove the patch earlier than 72 hours if you experience unpleasant side effects which may include dry mouth, dizziness or visual disturbances. 3. Avoid touching the patch. Wash your hands with soap and water after contact with the patch.     Regional Anesthesia Blocks  1. Numbness or the inability to move the "blocked" extremity may last from 3-48  hours after placement. The length of time depends on the medication injected and your individual response to the medication. If the numbness is not going away after 48 hours, call your surgeon.  2. The extremity that is blocked will need to be protected until the numbness is gone and the  Strength has returned. Because you cannot feel it, you will need to take extra care to avoid injury. Because it may be weak, you may have difficulty moving it or using it. You may not know what position it is in without looking at it while the block is in effect.  3. Bruising and tenderness at the needle site are common side effects and will resolve in a few days.  4. Persistent numbness or new problems with movement should be communicated to the surgeon or the [facility] [phone] [place] [phone]).    Discharge Instructions   You have a dressing with a plaster splint incorporated in it. Move your fingers as much as possible, making a full fist and fully opening the fist. Elevate your hand to reduce pain & swelling of the digits.  Ice over the operative site may be helpful to reduce pain & swelling.  DO NOT USE HEAT. Continue Meloxicam and Tylenol as prescribed. Take the Oxycodone that you have at home additionally for severe post operative pain. Leave the dressing in place until you return to our office. Wear your sling full time until you see [place]. You may shower, but keep the bandage clean & dry.  You may drive a car when you are off of prescription pain medications and can safely control your vehicle with both hands. Please call our office to arrange a follow up appointment for 10-15 days from the date of surgery.   Please call [phone] during normal business hours or [phone] after hours for any problems. Including the following:  - excessive redness of the incisions - drainage for more than 4 days - fever of more than 101.5 F  *Please note that pain  medications will not be refilled after hours or on weekends.  WORK STATUS: Out of work until first post operative appointment.    Call your surgeon if you experience:   1.  Fever over 101.0. 2.  Inability to urinate. 3.  Nausea and/or vomiting. 4.  Extreme swelling or bruising at the surgical site. 5.  Continued bleeding from the incision. 6.  Increased pain, redness or drainage from the incision. 7.  Problems related to your pain medication. 8.  Any problems and/or concerns

## 2020-01-13 NOTE — Anesthesia Procedure Notes (Signed)
Anesthesia Regional Block: Supraclavicular block   Pre-Anesthetic Checklist: ,, timeout performed, Correct Patient, Correct Site, Correct Laterality, Correct Procedure, Correct Position, site marked, Risks and benefits discussed,  Surgical consent,  Pre-op evaluation,  At surgeon's request and post-op pain management  Laterality: Right  Prep: chloraprep       Needles:  Injection technique: Single-shot  Needle Type: Echogenic Stimulator Needle     Needle Length: 9cm  Needle Gauge: 21   Needle insertion depth: 8 cm   Additional Needles:   Procedures:,,,, ultrasound used (permanent image in chart),,,,  Narrative:  Start time: 01/13/2020 7:45 AM End time: 01/13/2020 7:50 AM Injection made incrementally with aspirations every 5 mL.  Performed by: Personally  Anesthesiologist: Mal Amabile, MD  Additional Notes: Timeout performed. Patient sedated. Relevant anatomy ID'd using [place]. Incremental 2-35ml injection of LA with frequent aspiration. Patient tolerated procedure well.        Right Supraclavicular block

## 2020-01-13 NOTE — Transfer of Care (Signed)
Immediate Anesthesia Transfer of Care Note  Patient: Joseph [name]  Procedure(s) Performed: OPEN TREATMENT OF COMPLEX RIGHT PROXIMAL ULNA FRACTURE WITH POSSIBLE COLLATERAL LIGAMENT REPAIRS (Right Elbow)  Patient Location: PACU  Anesthesia Type:General and Regional  Level of Consciousness: awake, oriented and patient cooperative  Airway & Oxygen Therapy: Patient Spontanous Breathing and Patient connected to face mask oxygen  Post-op Assessment: Report given to RN, Post -op Vital signs reviewed and stable, Patient moving all extremities X 4 and Patient able to stick tongue midline  Post vital signs: Reviewed and stable  Last Vitals:  Vitals Value Taken Time  BP 128/77 01/13/20 1231  Temp    Pulse 96 01/13/20 1236  Resp 16 01/13/20 1236  SpO2 100 % 01/13/20 1236  Vitals shown include unvalidated device data.  Last Pain:  Vitals:   01/13/20 0722  TempSrc: Oral  PainSc: 5       Patients Stated Pain Goal: 4 (01/13/20 6469)  Complications: No apparent anesthesia complications

## 2020-01-13 NOTE — Anesthesia Procedure Notes (Signed)
Procedure Name: Intubation Date/Time: 01/13/2020 9:14 AM Performed by: Collier Bullock, CRNA Pre-anesthesia Checklist: Patient identified, Emergency Drugs available, Suction available and Patient being monitored Patient Re-evaluated:Patient Re-evaluated prior to induction Oxygen Delivery Method: Circle system utilized Preoxygenation: Pre-oxygenation with 100% oxygen Induction Type: IV induction Ventilation: Mask ventilation with difficulty, Oral airway inserted - appropriate to patient size and Nasal airway inserted- appropriate to patient size Laryngoscope Size: Mac, Glidescope and 3 (T3 then T4 glidescope) Grade View: Grade IV Tube type: Oral Tube size: 7.0 mm Number of attempts: 4 (DLx1 with MAC 3, then T3 glidescope by CRNA.  The MDA glidescope T3, then T4) Airway Equipment and Method: Stylet and Video-laryngoscopy Placement Confirmation: ETT inserted through vocal cords under direct vision,  positive ETCO2 and breath sounds checked- equal and bilateral Secured at: 24 cm Tube secured with: Tape Dental Injury: Teeth and Oropharynx as per pre-operative assessment and Bloody posterior oropharynx  Difficulty Due To: Difficulty was unanticipated, Difficult Airway- due to anterior larynx, Difficult Airway- due to large tongue and Difficult Airway- due to reduced neck mobility Future Recommendations: Recommend- induction with short-acting agent, and alternative techniques readily available

## 2020-01-13 NOTE — Progress Notes (Signed)
Assisted Dr. Foster with right, ultrasound guided, supraclavicular block. Side rails up, monitors on throughout procedure. See vital signs in flow sheet. Tolerated Procedure well. °

## 2020-01-16 ENCOUNTER — Encounter: Payer: Self-pay | Admitting: *Deleted

## 2020-01-16 NOTE — Addendum Note (Signed)
Addendum  created 01/16/20 1241 by Lance Coon, CRNA   Charge Capture section accepted

## 2020-01-26 ENCOUNTER — Other Ambulatory Visit: Payer: Self-pay | Admitting: Rheumatology

## 2020-01-26 DIAGNOSIS — L4050 Arthropathic psoriasis, unspecified: Secondary | ICD-10-CM

## 2020-01-26 MED FILL — METHOTREXATE 25 MG/ML VIAL: 50 | 90 days supply | Qty: 8 | Fill #0

## 2020-01-26 NOTE — Progress Notes (Signed)
Office Visit Note  Patient: Joseph [name]             Date of Birth: 1981-04-03           MRN: [id number]             PCP: Eunice Blase, MD Referring: Eunice Blase, MD Visit Date: 02/03/2020 Occupation: _0 @  Subjective:  Discuss restarting medications   History of Present Illness: Joseph [name] is a 39 y.o. male with history of psoriatic arthritis and osteoarthritis.  Patient reports that on 01/04/2020 he fell out of a tractor trailer while standing in the back of it and landed on his right side.  He was evaluated in the emergency department and was found to have a proximal ulnar fracture, radial head subluxation, and radial styloid fracture.  He had an open reduction and internal fixation performed by Dr. Grandville Silos on 01/13/2020.  His surgery was delayed due to the dosing of his recent Cosentyx and methotrexate injections.  According to the patient his pain has been well managed after surgery.  He continues to take meloxicam 15 mg 1 tablet daily for pain relief.  He states that he has been cleared by Dr. Grandville Silos to restart on Cosentyx.  [name] not planning on restarting on methotrexate until he finishes the meloxicam.  He will be following up with Dr. Grandville Silos on 02/13/2020 and will be starting physical therapy soon.  He denies any signs or symptoms of a psoriatic arthritis flares.  He states that he has noticed a patch of psoriasis on his left forearm.  He states that he would like a refill of clobetasol today.  He denies any Achilles tendinitis or plantar fasciitis.  He denies any SI joint pain.  Activities of Daily Living:  Patient reports morning stiffness for 0 minutes.   Patient Denies nocturnal pain.  Difficulty dressing/grooming: Reports Difficulty climbing stairs: Denies Difficulty getting out of chair: Denies Difficulty using hands for taps, buttons, cutlery, and/or writing: Denies  Review of Systems  Constitutional: Negative for fatigue.  HENT: Negative for mouth sores,  mouth dryness and nose dryness.   Eyes: Negative for itching and dryness.  Respiratory: Negative for shortness of breath, wheezing and difficulty breathing.   Cardiovascular: Negative for chest pain and palpitations.  Gastrointestinal: Negative for blood in stool, constipation and diarrhea.  Endocrine: Negative for increased urination.  Genitourinary: Negative for difficulty urinating.  Musculoskeletal: Negative for arthralgias, joint pain, joint swelling and morning stiffness.  Skin: Negative for rash and redness.  Allergic/Immunologic: Negative for susceptible to infections.  Neurological: Negative for dizziness, numbness, headaches, memory loss and weakness.  Hematological: Negative for bruising/bleeding tendency.  Psychiatric/Behavioral: Negative for confusion.    PMFS History:  Patient Active Problem List   Diagnosis Date Noted  . Morbid obesity with BMI of 45.0-49.9, adult ([place]) 11/25/2019  . Hair loss 12/31/2017  . Varicose veins of both lower extremities 07/26/2017  . Iron deficiency anemia 09/21/2014  . Psoriatic arthritis ([place]) 09/21/2014  . ESSENTIAL HYPERTENSION, MALIGNANT 02/18/2009  . Nonspecific (abnormal) findings on radiological and other examination of body structure 02/16/2009  . CHEST XRAY, ABNORMAL 02/16/2009    Past Medical History:  Diagnosis Date  . Acute sinus infection 11/07/2014  . Arthritis   . Blood transfusion without reported diagnosis    At [facility]   . Difficult intubation 01/13/2020   difficult airway in OR (per Dr Laurey Arrow)  . Iron deficiency anemia 09/21/2014    Family History  Problem Relation Age  of Onset  . Pulmonary fibrosis Mother   . Hypertension Mother   . Pancreatic cancer Maternal Grandmother   . Rheum arthritis Paternal Aunt   . Cancer Paternal Grandmother   . Breast cancer Paternal Grandmother   . COPD Paternal Grandfather   . CAD Paternal Grandfather   . Colon cancer Neg Hx   . Prostate cancer Neg Hx   .  Diabetes Neg Hx   . Thyroid disease Neg Hx    Past Surgical History:  Procedure Laterality Date  . COLONOSCOPY WITH ESOPHAGOGASTRODUODENOSCOPY (EGD)    . HERNIA REPAIR  1994  . 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 LIGAMENT REPAIR Right 01/13/2020   Procedure: COLLATERAL LIGAMENT REPAIRS;  Surgeon: Milly Jakob, MD;  Location: [place];  Service: Orthopedics;  Laterality: Right;  . ORIF ULNAR FRACTURE Right 01/13/2020   Procedure: OPEN TREATMENT OF COMPLEX RIGHT PROXIMAL ULNA FRACTURE;  Surgeon: Milly Jakob, MD;  Location: [place];  Service: Orthopedics;  Laterality: Right;  PROCEDURE: OPEN TREATMENT OF COMPLEX RIGHT PROXIMAL ULNA FRACTURE WITH POSSIBLE COLLATERAL LIGAMENT REPAIRS  LENGTH OF SURGERY: 2.5 HOURS MAC + REGIONAL BLOCK   Social History   Social History Narrative   Right handed   Caffeien use:1 soda per day   Immunization History  Administered Date(s) Administered  . MMR 02/12/2001  . Pneumococcal Polysaccharide-23 11/10/2012  . Td 08/17/2001     Objective: Vital Signs: BP (!) 171/118 (BP Location: Left Wrist, Patient Position: Sitting, Cuff Size: Normal)   Pulse 87   Resp 17   Ht _0  (1.778 m)   Wt 299 lb 9.6 oz (135.9 kg)   BMI 42.99 kg/m    Physical Exam Vitals and nursing note reviewed.  Constitutional:      Appearance: [name] well-developed.  HENT:     Head: Normocephalic and atraumatic.  Eyes:     Conjunctiva/sclera: Conjunctivae normal.     Pupils: Pupils are equal, round, and reactive to light.  Pulmonary:     Effort: Pulmonary effort is normal.  Abdominal:     General: Bowel sounds are normal.     Palpations: Abdomen is soft.  Musculoskeletal:     Cervical back: Normal range of motion and neck supple.  Skin:    General: Skin is warm and dry.     Capillary Refill: Capillary refill takes less than 2 seconds.     Comments: 1 small patch of psoriasis on the  dorsal aspect of the left forearm.  Neurological:     Mental Status: [name] alert and oriented to person, place, and time.  Psychiatric:        Behavior: Behavior normal.      Musculoskeletal Exam: C-spine, thoracic, lumbar spine good range of motion.  No midline spinal tenderness.  No SI joint tenderness.  Right arm in sling.  Left shoulder, left elbow joint, left wrist, MCPs, PIPs and DIPs good range of motion with no synovitis.  He has complete fist formation of the left hand.  Hip joints have good range of motion with no discomfort.  Knee joints have good range of motion with no warmth or effusion.  Ankle joints have good range of motion with no tenderness or inflammation.  No Achilles tendinitis or plantar fasciitis.  CDAI Exam: CDAI Score: -- Patient Global: --; Provider Global: -- Swollen: --; Tender: -- Joint Exam 02/03/2020   No joint exam has  been documented for this visit   There is currently no information documented on the homunculus. Go to the Rheumatology activity and complete the homunculus joint exam.  Investigation: No additional findings.  Imaging: DG Elbow Complete Right  Result Date: 01/13/2020 CLINICAL DATA:  ORIF of a proximal right ulna fracture. EXAM: RIGHT ELBOW - COMPLETE 3+ VIEW COMPARISON:  CT with elbow 01/04/2020 FINDINGS: Fluoroscopic images depict successful ORIF of a highly comminuted proximal ulnar fracture seen on comparison CT. Postsurgical a alignment is near anatomic. No acute hardware complication or failure is evident. Expected postsurgical soft tissue changes are noted. IMPRESSION: ORIF of a highly comminuted proximal ulnar fracture seen on comparison imaging. Electronically Signed   By: Lovena Le M.D.   On: 01/13/2020 21:09   CT ELBOW RIGHT WO CONTRAST  Result Date: 01/04/2020 CLINICAL DATA:  Right elbow fracture. EXAM: CT OF THE LOWER RIGHT EXTREMITY WITHOUT CONTRAST TECHNIQUE: Multidetector CT imaging of the right lower extremity was  performed according to the standard protocol. COMPARISON:  Radiographs dated 01/04/2020 FINDINGS: Bones/Joint/Cartilage There is a severely comminuted fracture of the proximal ulna with approximately 2 cm of distraction of the olecranon process. The coronoid process is fragmented. The medial component of the coronoid process is medially displaced with respect to the trochlea. There is slight volar subluxation of the radial head with respect to the capitellum. There is no discrete radial head fracture. The distal humerus is intact. Muscles and Tendons No discrete abnormalities. Detail is limited due to artifact because a necessary patient positioning. Soft tissues No discrete abnormality. IMPRESSION: Severely comminuted fracture of the proximal ulna as described. Slight subluxation of the radial head. Detail is limited because of streak artifact caused by necessary patient positioning. Electronically Signed   By: Lorriane Shire M.D.   On: 01/04/2020 12:55   DG C-Arm 1-60 Min  Result Date: 01/13/2020 CLINICAL DATA:  Surgery.  ORIF. EXAM: DG C-ARM 1-60 MIN FLUOROSCOPY TIME:  Fluoroscopy Time:  1 minutes and 16 seconds Number of Acquired Spot Images: 2 COMPARISON:  01/04/2020 FINDINGS: Patient is status post plate screw fixation of the proximal ulna. The hardware is intact. There are expected postsurgical changes including subcutaneous gas and overlying soft tissue edema. IMPRESSION: Status post ORIF of the proximal ulna. Electronically Signed   By: Constance Holster M.D.   On: 01/13/2020 21:04    Recent Labs: Lab Results  Component Value Date   WBC 10.0 11/25/2019   HGB 12.7 (L) 11/25/2019   PLT 232 11/25/2019   NA 136 11/25/2019   K 3.8 11/25/2019   CL 101 11/25/2019   CO2 26 11/25/2019   GLUCOSE 98 11/25/2019   BUN 10 11/25/2019   CREATININE 0.95 11/25/2019   BILITOT 0.5 11/25/2019   ALKPHOS 104 03/11/2019   AST 12 11/25/2019   ALT 12 11/25/2019   PROT 7.6 11/25/2019   ALBUMIN 3.8 (L)  03/11/2019   CALCIUM 8.4 (L) 11/25/2019   GFRAA 91 06/10/2019   QFTBGOLD Negative 04/03/2017   QFTBGOLDPLUS NEGATIVE 12/16/2019    Speciality Comments: Prior therapy: Enbrel/Humira (inadequate response)  and Remicaide (d/c due to cost and time consuming infusion)  Procedures:  No procedures performed Allergies: Bee venom and Sulfonamide derivatives   Assessment / Plan:     Visit Diagnoses: Psoriatic arthritis ([place]): He has no synovitis or dactylitis on exam.  He has not had any recent psoriatic arthritis flares.  His last dose of Cosentyx was on 12/31/2019.  The patient fell off a 4 to 5  foot tractor trailer on 01/04/2020 and landed on his right side.  He was evaluated in the emergency department and x-rays revealed proximal ulnar fracture, radial head subluxation, and radial styloid fracture.  He postponed open reduction internal fixation and medial lateral collateral ligament repair by Dr. Grandville Silos until 01/13/2020 due to his recent methotrexate and Cosentyx injections.  He has not had any complications postoperatively.  His pain has been well managed.  According to the patient he has been cleared by Dr. Grandville Silos to resume Cosentyx.  [name] holding methotrexate until he has completed the course of meloxicam.  [name] not having any signs of a psoriatic arthritis flare at this time.  No Achilles tendinitis or plantar fasciitis.  No SI joint pain.  [name] developing one small patch of psoriasis on the dorsal aspect of the left forearm.  A refill of clobetasol was sent to the pharmacy today.  He was advised to notify us if he develops increased joint pain or joint swelling.  He will follow-up in the office in 5 months.  Psoriasis: He has 1 small patch of psoriasis on the dorsal aspect of the left forearm.  A refill of clobetasol was sent to the pharmacy today.  High risk medication use - Cosentyx 144m every14 days (started on 08/18/18), methotrexate 0.6 mL every 7 days, and folic acid 1 mg 1 tablet  daily.(inadequate response to Enbrel, Remicade)  He has been holding Cosentyx and methotrexate since his last dose on 12/31/2019.  He had an open reduction internal fixation of the right elbow performed by Dr. TGrandville Siloson 01/13/2020.  According to the patient he has been cleared to restart on Cosentyx.  He will continue holding methotrexate until he completes the course of meloxicam.  Standing orders for CBC and CMP were placed today.  He will return for lab work in April and every 3 months to monitor for drug toxicity.- Plan: CBC with Differential/Platelet, COMPLETE METABOLIC PANEL WITH GFR  Primary osteoarthritis of both hands: [name] not having any tenderness or inflammation at this time.  Joint protection and muscle strengthening were discussed.   Tendinopathy of right shoulder: [name] currently in a sling so I was unable to assess range of motion.  [name] not having any discomfort in the right shoulder at this time.   Closed fracture of proximal end of right ulna with routine healing, unspecified fracture morphology, subsequent encounter: He fell off a 4 -5 ft tractor trailer and landed on his right side on 12/15/2019.  He was evaluated in the emergency department and was found to have a right proximal ulnar fracture.  He was scheduled for internal fixation on 01/13/2020 by Dr. TGrandville Silos  He continues to wear his sling.  He has not started physical therapy yet.  He will be following up with Dr. TGrandville Siloson 02/13/2020 for further direction.  He continues to take meloxicam 15 mg 1 tablet by mouth daily.   Other medical conditions are listed as follows:   Pedal edema  History of hypertension  Varicose veins of both lower extremities, unspecified whether complicated  History of iron deficiency anemia   Orders: Orders Placed This Encounter  Procedures  . CBC with Differential/Platelet  . COMPLETE METABOLIC PANEL WITH GFR   Meds ordered this encounter  Medications  . clobetasol cream (TEMOVATE) 0.05  %    Sig: Apply topically 2 (two) times daily as needed.    Dispense:  30 g    Refill:  1  Follow-Up Instructions: Return in about 5 months (around 07/05/2020) for Psoriatic arthritis.   Ofilia Neas, PA-C  Note - This record has been created using Dragon software.  Chart creation errors have been sought, but may not always  have been located. Such creation errors do not reflect on  the standard of medical care.

## 2020-01-26 NOTE — Telephone Encounter (Signed)
Last Visit: 06/10/2019 Next Visit: 02/03/2020 Labs: 11/25/2019 calcium 8.4, hemoglobin 12.7, MCV 76.9, MCH 24.3, MCHC 31.6, RDW 17.3  Okay to refill per Dr. Corliss Skains

## 2020-01-27 MED FILL — COSENTYX 300 MG DOSE-2 PENS: 150 | 28 days supply | Qty: 2 | Fill #1

## 2020-02-03 ENCOUNTER — Ambulatory Visit: Payer: No Typology Code available for payment source | Admitting: Physician Assistant

## 2020-02-03 ENCOUNTER — Other Ambulatory Visit: Payer: Self-pay

## 2020-02-03 ENCOUNTER — Encounter: Payer: Self-pay | Admitting: Physician Assistant

## 2020-02-03 VITALS — BP 171/118 | HR 87 | Resp 17 | Ht 70.0 in | Wt 299.6 lb

## 2020-02-03 DIAGNOSIS — L409 Psoriasis, unspecified: Secondary | ICD-10-CM

## 2020-02-03 DIAGNOSIS — M19041 Primary osteoarthritis, right hand: Secondary | ICD-10-CM

## 2020-02-03 DIAGNOSIS — Z8679 Personal history of other diseases of the circulatory system: Secondary | ICD-10-CM

## 2020-02-03 DIAGNOSIS — Z79899 Other long term (current) drug therapy: Secondary | ICD-10-CM | POA: Diagnosis not present

## 2020-02-03 DIAGNOSIS — I8393 Asymptomatic varicose veins of bilateral lower extremities: Secondary | ICD-10-CM

## 2020-02-03 DIAGNOSIS — M19042 Primary osteoarthritis, left hand: Secondary | ICD-10-CM

## 2020-02-03 DIAGNOSIS — L4050 Arthropathic psoriasis, unspecified: Secondary | ICD-10-CM

## 2020-02-03 DIAGNOSIS — R600 Localized edema: Secondary | ICD-10-CM

## 2020-02-03 DIAGNOSIS — M67911 Unspecified disorder of synovium and tendon, right shoulder: Secondary | ICD-10-CM

## 2020-02-03 DIAGNOSIS — S52001D Unspecified fracture of upper end of right ulna, subsequent encounter for closed fracture with routine healing: Secondary | ICD-10-CM

## 2020-02-03 DIAGNOSIS — Z862 Personal history of diseases of the blood and blood-forming organs and certain disorders involving the immune mechanism: Secondary | ICD-10-CM

## 2020-02-03 MED ORDER — CLOBETASOL PROPIONATE 0.05 % EX CREA: TOPICAL_CREAM | Freq: Two times a day (BID) | CUTANEOUS | 1 refills | Status: DC | PRN

## 2020-02-03 NOTE — Patient Instructions (Signed)
Standing Labs We placed an order today for your standing lab work.    Please come back and get your standing labs in April and every 3 months   We have open lab daily Monday through Thursday from 8:30-12:30 PM and 1:30-4:30 PM and Friday from 8:30-12:30 PM and 1:30-4:00 PM at the office of Dr. Shaili Deveshwar.   You may experience shorter wait times on Monday and Friday afternoons. The office is located at [address], [address], [place], [place] [address] No appointment is necessary.   Labs are drawn by [facility].  You may receive a bill from [facility] for your lab work.  If you wish to have your labs drawn at another location, please call the office 24 hours in advance to send orders.  If you have any questions regarding directions or hours of operation,  please call [phone].   Just as a reminder please drink plenty of water prior to coming for your lab work. Thanks!   

## 2020-02-14 ENCOUNTER — Ambulatory Visit: Payer: Self-pay | Admitting: Student

## 2020-02-14 DIAGNOSIS — S52031P Displaced fracture of olecranon process with intraarticular extension of right ulna, subsequent encounter for closed fracture with malunion: Secondary | ICD-10-CM

## 2020-02-15 ENCOUNTER — Other Ambulatory Visit: Payer: Self-pay

## 2020-02-15 ENCOUNTER — Other Ambulatory Visit (HOSPITAL_COMMUNITY)
Admission: RE | Admit: 2020-02-15 | Discharge: 2020-02-15 | Disposition: A | Discharge status: Home / Self Care | Payer: No Typology Code available for payment source | Source: Ambulatory Visit | Attending: Student | Admitting: Student

## 2020-02-15 DIAGNOSIS — M1990 Unspecified osteoarthritis, unspecified site: Secondary | ICD-10-CM | POA: Diagnosis not present

## 2020-02-15 DIAGNOSIS — Z79899 Other long term (current) drug therapy: Secondary | ICD-10-CM | POA: Diagnosis not present

## 2020-02-15 DIAGNOSIS — M24421 Recurrent dislocation, right elbow: Secondary | ICD-10-CM | POA: Diagnosis not present

## 2020-02-15 DIAGNOSIS — Z20822 Contact with and (suspected) exposure to covid-19: Secondary | ICD-10-CM | POA: Diagnosis not present

## 2020-02-15 DIAGNOSIS — Z8249 Family history of ischemic heart disease and other diseases of the circulatory system: Secondary | ICD-10-CM | POA: Diagnosis not present

## 2020-02-15 DIAGNOSIS — Z791 Long term (current) use of non-steroidal anti-inflammatories (NSAID): Secondary | ICD-10-CM | POA: Diagnosis not present

## 2020-02-15 DIAGNOSIS — Z882 Allergy status to sulfonamides status: Secondary | ICD-10-CM | POA: Diagnosis not present

## 2020-02-15 DIAGNOSIS — L4050 Arthropathic psoriasis, unspecified: Secondary | ICD-10-CM | POA: Diagnosis not present

## 2020-02-15 LAB — SARS CORONAVIRUS 2 (TAT 6-24 HRS): SARS Coronavirus 2: NEGATIVE

## 2020-02-16 ENCOUNTER — Other Ambulatory Visit: Payer: Self-pay

## 2020-02-16 ENCOUNTER — Encounter (HOSPITAL_COMMUNITY): Payer: Self-pay | Admitting: Student

## 2020-02-16 MED ORDER — DEXTROSE 5 % IV SOLN
3.0000 g | INTRAVENOUS | Status: DC
  Filled 2020-02-16: qty 3000

## 2020-02-16 MED ORDER — DEXTROSE 5 % IV SOLN
3.0000 g | INTRAVENOUS | Status: AC
  Administered 2020-02-17 (×2): 2 g via INTRAVENOUS
  Filled 2020-02-16 (×2): qty 3000

## 2020-02-16 NOTE — Progress Notes (Signed)
Anesthesia Chart Review: [name]   Case: [id number] Date/Time: 02/17/20 0715   Procedure: OPEN REDUCTION INTERNAL FIXATION (ORIF) ELBOW/OLECRANON FRACTURE (Right Elbow)   Anesthesia type: General   Diagnosis: Closed disp fx of right olecranon with intraartic exten w malunion [S52.031P]   Pre-op diagnosis: Right elbow dislocation/fracture   Location: [facility] OR ROOM 04 / [facility]   Surgeons: Shona Needles, MD      DISCUSSION: Patient is a 39 year old male scheduled for the above procedure. He fell 4-5 feet off the back of a tractor trailer on 01/04/20 and after coordinating timing of surgery with rheumatologist (was on Cosentyx and MTX), he underwent ORIF right proximal ulna fracture and right elbow lateral and medial collateral ligament repairs on 01/13/20 by Joseph Jakob, MD.    History includes never smoker, psoriatic arthritis, iron deficiency anemia (2015, believed to be due to "chronic blood loss/malabsorption syndrome all bone marrow suppression from chronic immunosuppressive therapy", saw hematologist Joseph Lark, MD). BMI on 01/13/20 was documented at 42.51 (WT 134.4 kg).  No reported HTN, but elevated BP readings ranging from 143-171/88/118 noted. Was 142/91 on 01/13/20 per anesthesia assessment note.   DIFFICULT INTUBATION noted on 01/13/20. "Block inadequate for surgery. GETA induced. Patient was difficult intubation. Intubated using Glidescope with T4 blade." Induction Type: IV induction Ventilation: Mask ventilation with difficulty, Oral airway inserted - appropriate to patient size and Nasal airway inserted- appropriate to patient size Laryngoscope Size: Mac, Glidescope and 3 (T3 then T4 glidescope) Grade View: Grade IV Tube type: Oral Tube size: 7.0 mm Number of attempts: 4 (DLx1 with MAC 3, then T3 glidescope by CRNA.  The MDA glidescope T3, then T4) Airway Equipment and Method: Stylet and Video-laryngoscopy Secured at: 24 cm Dental Injury: Teeth and Oropharynx as per  pre-operative assessment and Bloody posterior oropharynx  Difficulty Due To: Difficulty was unanticipated, Difficult Airway- due to anterior larynx, Difficult Airway- due to large tongue and Difficult Airway- due to reduced neck mobility Future Recommendations: Recommend- induction with short-acting agent, and alternative techniques readily available    MTX and Cosentyx currently on hold for surgery.  02/15/2020 presurgical COVID-19 test negative.  [name] for labs and anesthesia team evaluation on the day of surgery.   VS:  BP Readings from Last 3 Encounters:  02/03/20 (!) 171/118  01/13/20 (!) 149/88  01/04/20 (!) 143/118   Pulse Readings from Last 3 Encounters:  02/03/20 87  01/13/20 99  01/04/20 82    PROVIDERS: Eunice Blase, MD is listed as PCP. Established 11/25/19.  Bo Merino, MD is rheumatolgoist - [name] not followed routinely by cardiology, but was evaluated by Joseph [name] on 02/16/09 for finding of right sided aortic arch. Echo ordered and showed normal bi-ventricular function, normal AV without AR or AS, no evidence of aortic aneurysm or dissection with normal aortic root size. As needed follow-up.    LABS: [name] As of 11/25/19, Cr 0.95, glucose 98, ALT WNL, H/H 12.7/40.2, PLT 232.    IMAGES: CHEST - 2 VIEW 11/23/13 ([place]; report in [facility]/PACS): COMPARISON: 02/16/2009 and earlier studies FINDINGS: Lungs clear. Heart size upper limits normal. Right-sided aortic arch. Motion degrades the lateral projection. . No effusion. Visualized skeletal structures are unremarkable. IMPRESSION: No acute cardiopulmonary disease. Right-sided aortic arch.   EKG: Prior EKG on 02/16/09 showed NSR, non-specific T wave abnormality. RSR prime pattern in V1.    CV: Echo 02/20/09: Left ventricle: The cavity size was normal. Wall thickness  was  normal. Systolic function was normal. The estimated ejection  fraction was in the range of 55% to 65%.  Regional wall motion  abnormalities cannot be excluded.  Aortic valve: Trileaflet; normal thickness leaflets. Mobility was  not restricted. Doppler: Transvalvular velocity was within the  normal range. There was no stenosis. No regurgitation.  Aorta: There was no evidence for aneurysm. There was no evidence for  dissection. Aortic root: The aortic root was normal in size.  Mitral valve: Structurally normal valve. Leaflet separation was  normal. Mobility was not restricted. Doppler: Transvalvular velocity  was within the normal range. There was no evidence for stenosis. No  regurgitation. Peak gradient: 41m Hg (D).  Left atrium: The atrium was normal in size.  Right ventricle: The cavity size was normal. Wall thickness was  normal. Systolic function was normal.  Pulmonic valve: Doppler: Transvalvular velocity was within the  normal range. There was no evidence for stenosis.  Tricuspid valve: Doppler: Transvalvular velocity was within the  normal range. There was no evidence for stenosis. No regurgitation.  Pericardium: There was no pericardial effusion.  Systemic veins:  Inferior vena cava: The vessel was normal in size.    Past Medical History:  Diagnosis Date  . Acute sinus infection 11/07/2014  . Arthritis   . [facility] reported diagnosis    [facility]  . Difficult intubation 01/13/2020   difficult airway in OR (per Dr FLaurey Arrow  . History of blood transfusion   . Iron deficiency anemia 09/21/2014  . Psoriatic arthritis ([place]   . 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 HPercival Spanish4/2010 with echo, as needed f/u    Past Surgical History:  Procedure Laterality Date  . COLONOSCOPY WITH ESOPHAGOGASTRODUODENOSCOPY (EGD)    . HERNIA REPAIR  1994  . 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  LIGAMENT REPAIR Right 01/13/2020   Procedure: COLLATERAL LIGAMENT REPAIRS;  Surgeon: TMilly Jakob MD;  [place]  Service: Orthopedics;  Laterality: Right;  . ORIF ULNAR FRACTURE Right 01/13/2020   Procedure: OPEN TREATMENT OF COMPLEX RIGHT PROXIMAL ULNA FRACTURE;  Surgeon: TMilly Jakob MD;  [place]  Service: Orthopedics;  Laterality: Right;  PROCEDURE: OPEN TREATMENT OF COMPLEX RIGHT PROXIMAL ULNA FRACTURE WITH POSSIBLE COLLATERAL LIGAMENT REPAIRS  LENGTH OF SURGERY: 2.5 HOURS MAC + REGIONAL BLOCK    MEDICATIONS: . [START ON 02/17/2020] ceFAZolin (ANCEF) 3 g in dextrose 5 % 50 mL IVPB   . acetaminophen (TYLENOL) 325 MG tablet  . clobetasol cream (TEMOVATE) 0.05 %  . ferrous sulfate 325 (65 FE) MG tablet  . folic acid (FOLVITE) 1 MG tablet  . meloxicam (MOBIC) 15 MG tablet  . Multiple Vitamins-Minerals (MULTIVITAMIN WITH MINERALS) tablet  . oxyCODONE (ROXICODONE) 5 MG immediate release tablet  . methotrexate 50 MG/2ML injection  . Secukinumab (COSENTYX SENSOREADY PEN) 150 MG/ML SOAJ    AMyra Gianotti PA-C Surgical Short Stay/Anesthesiology MKern [facility] DistrictPhone [phone]Phone ([phone]/06/2020 12:33 PM

## 2020-02-16 NOTE — Progress Notes (Signed)
Mr Lindbloom denies chest pain or shortness of breath.  Patient tested negative for Covid on 02/15/2020, he has been in quarantine with his family since time of test. Mr. Wilkinson denied HTN when I asked, I notied in his record that his blood pressure was 117/118 on 02/14/2020 at office.  I asked patient if he has had elevated blood pressures in the past, patient said, 'they have mentioned it was high sometimes." After speaking to patient I read PCP, Dr Deborra Medina note from office visit on ,  Patient's wife checked patient's blood pressure after last office PCP visit, and it had improved.  I called Mr Daywalt back and left a voice  Message asking patient to call with back with more recent blood pressure he has had taken at home.  I Instructed patient that he is not to eat anything after midnight. I instructed patient may drink clear liquids until 0430 . We went over what beverages are 'clear liquids'. Patient lives out of town and will not be able to pick up pre-surgery Ensure.  I encouraged patient to drink Gatorade before 0430`, patient drinks Dr. Reino Kent, I told he may drink that water or clear juice until 0430.

## 2020-02-16 NOTE — Anesthesia Preprocedure Evaluation (Addendum)
Anesthesia Evaluation  Patient identified by MRN, date of birth, ID band Patient awake    Reviewed: Allergy & Precautions, NPO status , Patient's Chart, lab work & pertinent test results  History of Anesthesia Complications (+) DIFFICULT AIRWAY  Airway Mallampati: III  TM Distance: >3 FB Neck ROM: Full    Dental  (+) Teeth Intact   Pulmonary neg pulmonary ROS,    Pulmonary exam normal breath sounds clear to auscultation       Cardiovascular hypertension, Pt. on medications Normal cardiovascular exam Rhythm:Regular Rate:Normal     Neuro/Psych negative neurological ROS  negative psych ROS   GI/Hepatic negative GI ROS, Neg liver ROS,   Endo/Other  Morbid obesity  Renal/GU negative Renal ROS  negative genitourinary   Musculoskeletal  (+) Arthritis , Fx Dislocation right Elbow Psoriatic Arthritis   Abdominal (+) + obese,   Peds  Hematology  (+) anemia ,   Anesthesia Other Findings   Reproductive/Obstetrics                                                              Anesthesia Evaluation  Patient identified by MRN, date of birth, ID band Patient awake    Reviewed: Allergy & Precautions, NPO status , Patient's Chart, lab work & pertinent test results  History of Anesthesia Complications (+) PONV  Airway Mallampati: III  TM Distance: >3 FB Neck ROM: Full    Dental no notable dental hx. (+) Teeth Intact   Pulmonary neg pulmonary ROS,    Pulmonary exam normal breath sounds clear to auscultation       Cardiovascular hypertension, Normal cardiovascular exam Rhythm:Regular Rate:Normal     Neuro/Psych negative neurological ROS  negative psych ROS   GI/Hepatic negative GI ROS, Neg liver ROS,   Endo/Other  Morbid obesity  Renal/GU negative Renal ROS  negative genitourinary   Musculoskeletal  (+) Arthritis , Psoriatic arthritis Fx right proximal ulna   Abdominal (+) + obese,   Peds  Hematology  (+) anemia ,   Anesthesia Other Findings   Reproductive/Obstetrics                            Anesthesia Physical Anesthesia Plan  ASA: III  Anesthesia Plan: Regional and MAC   Post-op Pain Management:    Induction: Intravenous  PONV Risk Score and Plan: 1 and Treatment may vary due to age or medical condition, Propofol infusion, Ondansetron and Midazolam  Airway Management Planned: Natural Airway, Simple Face Mask and Nasal Cannula  Additional Equipment:   Intra-op Plan:   Post-operative Plan:   Informed Consent: I have reviewed the patients History and Physical, chart, labs and discussed the procedure including the risks, benefits and alternatives for the proposed anesthesia with the patient or authorized representative who has indicated his/her understanding and acceptance.     Dental advisory given  Plan Discussed with: CRNA and Surgeon  Anesthesia Plan Comments:         Anesthesia Quick Evaluation                                   Anesthesia Evaluation  Patient identified by MRN, date of birth, ID band Patient  awake    Reviewed: Allergy & Precautions, NPO status , Patient's Chart, lab work & pertinent test results  History of Anesthesia Complications (+) PONV  Airway Mallampati: III  TM Distance: >3 FB Neck ROM: Full    Dental no notable dental hx. (+) Teeth Intact   Pulmonary neg pulmonary ROS,    Pulmonary exam normal breath sounds clear to auscultation       Cardiovascular hypertension, Normal cardiovascular exam Rhythm:Regular Rate:Normal     Neuro/Psych negative neurological ROS  negative psych ROS   GI/Hepatic negative GI ROS, Neg liver ROS,   Endo/Other  Morbid obesity  Renal/GU negative Renal ROS  negative genitourinary   Musculoskeletal  (+) Arthritis , Psoriatic arthritis Fx right proximal ulna   Abdominal (+) + obese,   Peds   Hematology  (+) anemia ,   Anesthesia Other Findings   Reproductive/Obstetrics                            Anesthesia Physical Anesthesia Plan  ASA: III  Anesthesia Plan: Regional and MAC   Post-op Pain Management:    Induction: Intravenous  PONV Risk Score and Plan: 1 and Treatment may vary due to age or medical condition, Propofol infusion, Ondansetron and Midazolam  Airway Management Planned: Natural Airway, Simple Face Mask and Nasal Cannula  Additional Equipment:   Intra-op Plan:   Post-operative Plan:   Informed Consent: I have reviewed the patients History and Physical, chart, labs and discussed the procedure including the risks, benefits and alternatives for the proposed anesthesia with the patient or authorized representative who has indicated his/her understanding and acceptance.     Dental advisory given  Plan Discussed with: CRNA and Surgeon  Anesthesia Plan Comments:         Anesthesia Quick Evaluation  Anesthesia Physical Anesthesia Plan  ASA: III  Anesthesia Plan: General   Post-op Pain Management:  Regional for Post-op pain   Induction: Intravenous  PONV Risk Score and Plan: 3 and Ondansetron, Midazolam, Dexamethasone and Treatment may vary due to age or medical condition  Airway Management Planned: Oral ETT and Video Laryngoscope Planned  Additional Equipment:   Intra-op Plan:   Post-operative Plan:   Informed Consent: I have reviewed the patients History and Physical, chart, labs and discussed the procedure including the risks, benefits and alternatives for the proposed anesthesia with the patient or authorized representative who has indicated his/her understanding and acceptance.     Dental advisory given  Plan Discussed with: CRNA and Surgeon  Anesthesia Plan Comments: (PAT note written 02/16/2020 by Myra Gianotti, PA-C.  DIFFICULT INTUBATION noted on 01/13/20. "Block inadequate for surgery. GETA  induced. Patient was difficult intubation. Intubated using Glidescope with T4 blade." Mask ventilation with difficulty, Grade IV. 7.0 ETT, 4 attempts (DLx1 with MAC 3, then T3 glidescope by CRNA.  The MDA glidescope T3, then T4). Difficulty was unanticipated, Difficult Airway- due to anterior larynx, Difficult Airway- due to large tongue and Difficult Airway- due to reduced neck mobility Future Recommendations: Recommend- induction with short-acting agent, and alternative techniques readily available )       Anesthesia Quick Evaluation

## 2020-02-17 ENCOUNTER — Ambulatory Visit (HOSPITAL_COMMUNITY): Payer: No Typology Code available for payment source | Admitting: Vascular Surgery

## 2020-02-17 ENCOUNTER — Encounter (HOSPITAL_COMMUNITY)
Admission: RE | Disposition: A | Discharge status: Home / Self Care | Payer: Self-pay | Source: Ambulatory Visit | Attending: Student

## 2020-02-17 ENCOUNTER — Encounter (HOSPITAL_COMMUNITY): Payer: Self-pay | Admitting: Student

## 2020-02-17 ENCOUNTER — Ambulatory Visit (HOSPITAL_COMMUNITY)
Admission: RE | Admit: 2020-02-17 | Discharge: 2020-02-17 | Disposition: A | Discharge status: Home / Self Care | Payer: No Typology Code available for payment source | Source: Ambulatory Visit | Attending: Student | Admitting: Student

## 2020-02-17 ENCOUNTER — Ambulatory Visit (HOSPITAL_COMMUNITY): Payer: Self-pay

## 2020-02-17 ENCOUNTER — Other Ambulatory Visit: Payer: Self-pay

## 2020-02-17 ENCOUNTER — Ambulatory Visit (HOSPITAL_COMMUNITY): Payer: Self-pay | Attending: Student

## 2020-02-17 DIAGNOSIS — Z20822 Contact with and (suspected) exposure to covid-19: Secondary | ICD-10-CM | POA: Diagnosis not present

## 2020-02-17 DIAGNOSIS — M24421 Recurrent dislocation, right elbow: Secondary | ICD-10-CM | POA: Diagnosis not present

## 2020-02-17 DIAGNOSIS — Z791 Long term (current) use of non-steroidal anti-inflammatories (NSAID): Secondary | ICD-10-CM | POA: Insufficient documentation

## 2020-02-17 DIAGNOSIS — Z882 Allergy status to sulfonamides status: Secondary | ICD-10-CM | POA: Insufficient documentation

## 2020-02-17 DIAGNOSIS — T148XXA Other injury of unspecified body region, initial encounter: Secondary | ICD-10-CM

## 2020-02-17 DIAGNOSIS — Z419 Encounter for procedure for purposes other than remedying health state, unspecified: Secondary | ICD-10-CM

## 2020-02-17 DIAGNOSIS — S52031P Displaced fracture of olecranon process with intraarticular extension of right ulna, subsequent encounter for closed fracture with malunion: Secondary | ICD-10-CM

## 2020-02-17 DIAGNOSIS — Z8249 Family history of ischemic heart disease and other diseases of the circulatory system: Secondary | ICD-10-CM | POA: Insufficient documentation

## 2020-02-17 DIAGNOSIS — L4050 Arthropathic psoriasis, unspecified: Secondary | ICD-10-CM | POA: Insufficient documentation

## 2020-02-17 DIAGNOSIS — M1990 Unspecified osteoarthritis, unspecified site: Secondary | ICD-10-CM | POA: Diagnosis not present

## 2020-02-17 DIAGNOSIS — Z79899 Other long term (current) drug therapy: Secondary | ICD-10-CM | POA: Insufficient documentation

## 2020-02-17 HISTORY — PX: ORIF ELBOW FRACTURE: SHX5031

## 2020-02-17 HISTORY — DX: Arthropathic psoriasis, unspecified: L40.50

## 2020-02-17 HISTORY — DX: Right aortic arch: Q25.47

## 2020-02-17 LAB — CBC
HCT: 46.5 % (ref 39.0–52.0)
Hemoglobin: 13.9 g/dL (ref 13.0–17.0)
MCH: 25.4 pg — ABNORMAL LOW (ref 26.0–34.0)
MCHC: 29.9 g/dL — ABNORMAL LOW (ref 30.0–36.0)
MCV: 84.9 fL (ref 80.0–100.0)
Platelets: 184 10*3/uL (ref 150–400)
RBC: 5.48 MIL/uL (ref 4.22–5.81)
RDW: 17.2 % — ABNORMAL HIGH (ref 11.5–15.5)
WBC: 9.3 10*3/uL (ref 4.0–10.5)
nRBC: 0 % (ref 0.0–0.2)

## 2020-02-17 SURGERY — OPEN REDUCTION INTERNAL FIXATION (ORIF) ELBOW/OLECRANON FRACTURE
Anesthesia: General | Site: Elbow | Laterality: Right

## 2020-02-17 MED ORDER — ROCURONIUM BROMIDE 50 MG/5ML IV SOSY
PREFILLED_SYRINGE | INTRAVENOUS | Status: DC | PRN
  Administered 2020-02-17: 10 mg via INTRAVENOUS
  Administered 2020-02-17: 90 mg via INTRAVENOUS

## 2020-02-17 MED ORDER — PROPOFOL 10 MG/ML IV BOLUS
INTRAVENOUS | Status: DC | PRN
  Administered 2020-02-17: 200 mg via INTRAVENOUS

## 2020-02-17 MED ORDER — FENTANYL CITRATE (PF) 100 MCG/2ML IJ SOLN
INTRAMUSCULAR | Status: DC | PRN
  Administered 2020-02-17: 150 ug via INTRAVENOUS
  Administered 2020-02-17 (×2): 50 ug via INTRAVENOUS

## 2020-02-17 MED ORDER — BUPIVACAINE HCL 0.5 % IJ SOLN
INTRAMUSCULAR | Status: DC | PRN
  Administered 2020-02-17: 15 mL

## 2020-02-17 MED ORDER — OXYCODONE HCL 5 MG/5ML PO SOLN: 5.0000 mg | Freq: Once | ORAL | Status: AC | PRN

## 2020-02-17 MED ORDER — EPHEDRINE 5 MG/ML INJ
INTRAVENOUS | Status: AC
  Filled 2020-02-17: qty 10

## 2020-02-17 MED ORDER — PHENYLEPHRINE 40 MCG/ML (10ML) SYRINGE FOR IV PUSH (FOR BLOOD PRESSURE SUPPORT)
PREFILLED_SYRINGE | INTRAVENOUS | Status: DC | PRN
  Administered 2020-02-17 (×2): 80 ug via INTRAVENOUS

## 2020-02-17 MED ORDER — TOBRAMYCIN SULFATE 1.2 G IJ SOLR
INTRAMUSCULAR | Status: AC
  Filled 2020-02-17: qty 1.2

## 2020-02-17 MED ORDER — ONDANSETRON HCL 4 MG/2ML IJ SOLN
INTRAMUSCULAR | Status: DC | PRN
  Administered 2020-02-17: 4 mg via INTRAVENOUS

## 2020-02-17 MED ORDER — STERILE WATER FOR IRRIGATION IR SOLN
Status: DC | PRN
  Administered 2020-02-17: 1000 mL

## 2020-02-17 MED ORDER — SUCCINYLCHOLINE CHLORIDE 200 MG/10ML IV SOSY
PREFILLED_SYRINGE | INTRAVENOUS | Status: DC | PRN
  Administered 2020-02-17: 140 mg via INTRAVENOUS

## 2020-02-17 MED ORDER — ASPIRIN EC 325 MG PO TBEC: 325.0000 mg | DELAYED_RELEASE_TABLET | Freq: Every day | ORAL | 0 refills | Status: AC

## 2020-02-17 MED ORDER — MIDAZOLAM HCL 5 MG/5ML IJ SOLN
INTRAMUSCULAR | Status: DC | PRN
  Administered 2020-02-17: 2 mg via INTRAVENOUS

## 2020-02-17 MED ORDER — MIDAZOLAM HCL 2 MG/2ML IJ SOLN: 2.0000 mg | Freq: Once | INTRAMUSCULAR | Status: AC

## 2020-02-17 MED ORDER — FENTANYL CITRATE (PF) 100 MCG/2ML IJ SOLN
INTRAMUSCULAR | Status: AC
  Administered 2020-02-17: 10:00:00 50 ug via INTRAVENOUS
  Filled 2020-02-17: qty 2

## 2020-02-17 MED ORDER — ALBUMIN HUMAN 5 % IV SOLN: INTRAVENOUS | Status: DC | PRN

## 2020-02-17 MED ORDER — OXYCODONE HCL 5 MG PO TABS
5.0000 mg | ORAL_TABLET | Freq: Once | ORAL | Status: AC | PRN
  Administered 2020-02-17: 5 mg via ORAL

## 2020-02-17 MED ORDER — SUGAMMADEX SODIUM 200 MG/2ML IV SOLN
INTRAVENOUS | Status: DC | PRN
  Administered 2020-02-17: 300 mg via INTRAVENOUS

## 2020-02-17 MED ORDER — FENTANYL CITRATE (PF) 100 MCG/2ML IJ SOLN: 50.0000 ug | Freq: Once | INTRAMUSCULAR | Status: AC

## 2020-02-17 MED ORDER — LIDOCAINE 2% (20 MG/ML) 5 ML SYRINGE
INTRAMUSCULAR | Status: DC | PRN
  Administered 2020-02-17: 80 mg via INTRAVENOUS

## 2020-02-17 MED ORDER — PHENYLEPHRINE 40 MCG/ML (10ML) SYRINGE FOR IV PUSH (FOR BLOOD PRESSURE SUPPORT)
PREFILLED_SYRINGE | INTRAVENOUS | Status: AC
  Filled 2020-02-17: qty 10

## 2020-02-17 MED ORDER — OXYCODONE-ACETAMINOPHEN 5-325 MG PO TABS: 1.0000 | ORAL_TABLET | ORAL | 0 refills | Status: DC | PRN

## 2020-02-17 MED ORDER — LACTATED RINGERS IV SOLN: INTRAVENOUS | Status: DC

## 2020-02-17 MED ORDER — PROPOFOL 10 MG/ML IV BOLUS
INTRAVENOUS | Status: AC
  Filled 2020-02-17: qty 20

## 2020-02-17 MED ORDER — 0.9 % SODIUM CHLORIDE (POUR BTL) OPTIME
TOPICAL | Status: DC | PRN
  Administered 2020-02-17: 1000 mL

## 2020-02-17 MED ORDER — HYDROMORPHONE HCL 1 MG/ML IJ SOLN
0.2500 mg | INTRAMUSCULAR | Status: DC | PRN
  Administered 2020-02-17: 0.25 mg via INTRAVENOUS

## 2020-02-17 MED ORDER — DEXAMETHASONE SODIUM PHOSPHATE 10 MG/ML IJ SOLN
INTRAMUSCULAR | Status: DC | PRN
  Administered 2020-02-17: 10 mg via INTRAVENOUS

## 2020-02-17 MED ORDER — HYDROMORPHONE HCL 1 MG/ML IJ SOLN
INTRAMUSCULAR | Status: AC
  Filled 2020-02-17: qty 1

## 2020-02-17 MED ORDER — LIDOCAINE 2% (20 MG/ML) 5 ML SYRINGE
INTRAMUSCULAR | Status: AC
  Filled 2020-02-17: qty 5

## 2020-02-17 MED ORDER — METHOCARBAMOL 500 MG PO TABS: 500.0000 mg | ORAL_TABLET | Freq: Four times a day (QID) | ORAL | 0 refills | Status: DC | PRN

## 2020-02-17 MED ORDER — FENTANYL CITRATE (PF) 250 MCG/5ML IJ SOLN
INTRAMUSCULAR | Status: AC
  Filled 2020-02-17: qty 5

## 2020-02-17 MED ORDER — MIDAZOLAM HCL 2 MG/2ML IJ SOLN
INTRAMUSCULAR | Status: AC
  Filled 2020-02-17: qty 2

## 2020-02-17 MED ORDER — VANCOMYCIN HCL 1000 MG IV SOLR
INTRAVENOUS | Status: AC
  Filled 2020-02-17: qty 1000

## 2020-02-17 MED ORDER — ONDANSETRON HCL 4 MG/2ML IJ SOLN
INTRAMUSCULAR | Status: AC
  Filled 2020-02-17: qty 2

## 2020-02-17 MED ORDER — DEXAMETHASONE SODIUM PHOSPHATE 10 MG/ML IJ SOLN
INTRAMUSCULAR | Status: AC
  Filled 2020-02-17: qty 1

## 2020-02-17 MED ORDER — VANCOMYCIN HCL 1000 MG IV SOLR
INTRAVENOUS | Status: DC | PRN
  Administered 2020-02-17: 1000 mg

## 2020-02-17 MED ORDER — MIDAZOLAM HCL 2 MG/2ML IJ SOLN
INTRAMUSCULAR | Status: AC
  Administered 2020-02-17: 2 mg via INTRAVENOUS
  Filled 2020-02-17: qty 2

## 2020-02-17 MED ORDER — ONDANSETRON HCL 4 MG/2ML IJ SOLN: 4.0000 mg | Freq: Once | INTRAMUSCULAR | Status: DC | PRN

## 2020-02-17 MED ORDER — TOBRAMYCIN SULFATE 1.2 G IJ SOLR
INTRAMUSCULAR | Status: DC | PRN
  Administered 2020-02-17: 1.2 g

## 2020-02-17 MED ORDER — SUCCINYLCHOLINE CHLORIDE 200 MG/10ML IV SOSY
PREFILLED_SYRINGE | INTRAVENOUS | Status: AC
  Filled 2020-02-17: qty 10

## 2020-02-17 MED ORDER — EPHEDRINE SULFATE-NACL 50-0.9 MG/10ML-% IV SOSY
PREFILLED_SYRINGE | INTRAVENOUS | Status: DC | PRN
  Administered 2020-02-17: 10 mg via INTRAVENOUS

## 2020-02-17 MED ORDER — OXYCODONE HCL 5 MG PO TABS
ORAL_TABLET | ORAL | Status: AC
  Filled 2020-02-17: qty 1

## 2020-02-17 MED ORDER — BUPIVACAINE LIPOSOME 1.3 % IJ SUSP
INTRAMUSCULAR | Status: DC | PRN
  Administered 2020-02-17: 10 mL via PERINEURAL

## 2020-02-17 MED ORDER — GABAPENTIN 100 MG PO CAPS: 100.0000 mg | ORAL_CAPSULE | Freq: Three times a day (TID) | ORAL | 0 refills | Status: DC

## 2020-02-17 SURGICAL SUPPLY — 125 items
ANCHOR JUGGERKNOT W/DRL 2/1.45 (Orthopedic Implant) ×3 IMPLANT
ANCHOR JUGGERKNOT WTAP NDL 2.9 (Anchor) ×3 IMPLANT
ANCHOR QUATTRO LINK KNTLS 2.9 (Anchor) ×6 IMPLANT
ARM EF PROX ASSEMBLY OPTIROM (Orthopedic Implant) ×3 IMPLANT
BENZOIN TINCTURE PRP APPL 2/3 (GAUZE/BANDAGES/DRESSINGS) IMPLANT
BIT DRILL 2.5X2.75 QC CALB (BIT) ×3 IMPLANT
BIT DRILL CALIBRATED 2.7 (BIT) ×2 IMPLANT
BIT DRILL CALIBRATED 2.7MM (BIT) ×1
BIT DRILL CANN OPTIROM 4.8X180 (BIT) ×3 IMPLANT
BIT DRILL JC OPTIROM 3.2X200 (BIT) ×3 IMPLANT
BIT DRILL JUGRKNT W/NDL BIT2.9 (DRILL) ×1 IMPLANT
BLADE AVERAGE 25MMX9MM (BLADE)
BLADE AVERAGE 25X9 (BLADE) IMPLANT
BLADE CLIPPER SURG (BLADE) IMPLANT
BLADE SURG 10 STRL SS (BLADE) IMPLANT
BNDG COHESIVE 4X5 TAN STRL (GAUZE/BANDAGES/DRESSINGS) ×3 IMPLANT
BNDG ELASTIC 3X5.8 VLCR STR LF (GAUZE/BANDAGES/DRESSINGS) ×3 IMPLANT
BNDG ELASTIC 4X5.8 VLCR STR LF (GAUZE/BANDAGES/DRESSINGS) ×3 IMPLANT
BNDG ELASTIC 6X5.8 VLCR STR LF (GAUZE/BANDAGES/DRESSINGS) ×3 IMPLANT
BNDG ESMARK 4X9 LF (GAUZE/BANDAGES/DRESSINGS) IMPLANT
BNDG GAUZE ELAST 4 BULKY (GAUZE/BANDAGES/DRESSINGS) ×3 IMPLANT
BRUSH SCRUB EZ PLAIN DRY (MISCELLANEOUS) ×6 IMPLANT
CANISTER WOUNDNEG PRESSURE 500 (CANNISTER) ×3 IMPLANT
CHLORAPREP W/TINT 26 (MISCELLANEOUS) ×3 IMPLANT
CLAMP RAPID SCREW SL OPTIROM 6 (Orthopedic Implant) ×6 IMPLANT
CLEANER TIP ELECTROSURG 2X2 (MISCELLANEOUS) ×3 IMPLANT
CLOSURE WOUND 1/2 X4 (GAUZE/BANDAGES/DRESSINGS)
COMPNT EF CENTRAL ELBW OPTIROM (Orthopedic Implant) ×3 IMPLANT
COMPNT EF RT DIS ELBW OPTIROM (Orthopedic Implant) ×3 IMPLANT
COMPONENT EF CNTRL ELBW OPTRM (Orthopedic Implant) ×1 IMPLANT
COMPONENT EFRT DIS ELBW OPTIRM (Orthopedic Implant) ×1 IMPLANT
COVER SURGICAL LIGHT HANDLE (MISCELLANEOUS) ×3 IMPLANT
COVER WAND RF STERILE (DRAPES) ×3 IMPLANT
CUFF TOURN SGL QUICK 18X4 (TOURNIQUET CUFF) IMPLANT
CUFF TOURN SGL QUICK 24 (TOURNIQUET CUFF) ×3
CUFF TRNQT CYL 24X4X16.5-23 (TOURNIQUET CUFF) ×1 IMPLANT
DECANTER SPIKE VIAL GLASS SM (MISCELLANEOUS) IMPLANT
DISC TARGETING EF ELBW OPTIROM (Orthopedic Implant) ×3 IMPLANT
DRAPE C-ARM 42X72 X-RAY (DRAPES) ×3 IMPLANT
DRAPE C-ARMOR (DRAPES) ×3 IMPLANT
DRAPE INCISE IOBAN 66X45 STRL (DRAPES) IMPLANT
DRAPE ORTHO SPLIT 77X108 STRL (DRAPES) ×6
DRAPE SURG ORHT 6 SPLT 77X108 (DRAPES) ×2 IMPLANT
DRAPE U-SHAPE 47X51 STRL (DRAPES) ×3 IMPLANT
DRILL JUGGERKNOT W/NDL BIT 2.9 (DRILL) ×3
DRSG ADAPTIC 3X8 NADH LF (GAUZE/BANDAGES/DRESSINGS) IMPLANT
ELECT REM PT RETURN 9FT ADLT (ELECTROSURGICAL) ×3
ELECTRODE REM PT RTRN 9FT ADLT (ELECTROSURGICAL) ×1 IMPLANT
GAUZE SPONGE 4X4 12PLY STRL (GAUZE/BANDAGES/DRESSINGS) ×3 IMPLANT
GAUZE XEROFORM 5X9 LF (GAUZE/BANDAGES/DRESSINGS) ×3 IMPLANT
GLOVE BIO SURGEON STRL SZ 6.5 (GLOVE) ×6 IMPLANT
GLOVE BIO SURGEON STRL SZ7.5 (GLOVE) ×12 IMPLANT
GLOVE BIO SURGEONS STRL SZ 6.5 (GLOVE) ×3
GLOVE BIOGEL PI IND STRL 6.5 (GLOVE) ×1 IMPLANT
GLOVE BIOGEL PI IND STRL 7.0 (GLOVE) ×1 IMPLANT
GLOVE BIOGEL PI IND STRL 7.5 (GLOVE) ×1 IMPLANT
GLOVE BIOGEL PI INDICATOR 6.5 (GLOVE) ×2
GLOVE BIOGEL PI INDICATOR 7.0 (GLOVE) ×2
GLOVE BIOGEL PI INDICATOR 7.5 (GLOVE) ×2
GOWN STRL REUS W/ TWL LRG LVL3 (GOWN DISPOSABLE) ×2 IMPLANT
GOWN STRL REUS W/TWL LRG LVL3 (GOWN DISPOSABLE) ×6
K-WIRE ACE 1.6X6 (WIRE) ×6
K-WIRE FIXATION 2.0X6 (WIRE) ×3
K-WIRE PART THRD OPTIROM 3.2 (Wire) ×3 IMPLANT
KIT 1/3 TUB PL 3H 37M (Orthopedic Implant) ×2 IMPLANT
KIT BASIN OR (CUSTOM PROCEDURE TRAY) ×3 IMPLANT
KIT PREVENA INCISION MGT20CM45 (CANNISTER) ×3 IMPLANT
KIT TURNOVER KIT B (KITS) ×3 IMPLANT
KWIRE ACE 1.6X6 (WIRE) ×2 IMPLANT
KWIRE FIXATION 2.0X6 (WIRE) ×1 IMPLANT
KWIRE PART THRD OPTIROM 3.2 (Wire) ×1 IMPLANT
MANIFOLD NEPTUNE II (INSTRUMENTS) ×3 IMPLANT
NS IRRIG 1000ML POUR BTL (IV SOLUTION) ×3 IMPLANT
PACK ORTHO EXTREMITY (CUSTOM PROCEDURE TRAY) ×3 IMPLANT
PAD ARMBOARD 7.5X6 YLW CONV (MISCELLANEOUS) ×6 IMPLANT
PAD CAST 3X4 CTTN HI CHSV (CAST SUPPLIES) ×1 IMPLANT
PAD CAST 4YDX4 CTTN HI CHSV (CAST SUPPLIES) ×1 IMPLANT
PADDING CAST COTTON 3X4 STRL (CAST SUPPLIES) ×3
PADDING CAST COTTON 4X4 STRL (CAST SUPPLIES) ×3
PLATE OLECRANON LRG (Plate) ×3 IMPLANT
PLATE Y LOCK STE 2.5 (Plate) ×6 IMPLANT
PROS 1/3 TUB PL 3H 37M (Orthopedic Implant) ×6 IMPLANT
ROD FIBER CARBON 9.5X200MM (Rod) ×3 IMPLANT
SCREW CORT 120/40MM LENGTH (Screw) ×6 IMPLANT
SCREW CORT 3.5X26 (Screw) ×3 IMPLANT
SCREW CORT 4.5/3.5 DIA 100/40 (Screw) ×6 IMPLANT
SCREW CORT T15 24X3.5XST LCK (Screw) ×1 IMPLANT
SCREW CORT T15 26X3.5XST LCK (Screw) ×1 IMPLANT
SCREW CORTICAL 3.5X24MM (Screw) ×3 IMPLANT
SCREW CORTICAL LOW PROF 3.5X20 (Screw) ×9 IMPLANT
SCREW LOCK CORT STAR 3.5X10 (Screw) ×3 IMPLANT
SCREW LOCK CORT STAR 3.5X18 (Screw) ×6 IMPLANT
SCREW LOCK CORT STAR 3.5X20 (Screw) ×3 IMPLANT
SCREW LOCK CORT STAR 3.5X26 (Screw) ×3 IMPLANT
SCREW LOW PROFILE 18MMX3.5MM (Screw) ×6 IMPLANT
SPONGE LAP 18X18 RF (DISPOSABLE) ×6 IMPLANT
STAPLER VISISTAT 35W (STAPLE) ×3 IMPLANT
STRIP CLOSURE SKIN 1/2X4 (GAUZE/BANDAGES/DRESSINGS) IMPLANT
SUCTION FRAZIER HANDLE 10FR (MISCELLANEOUS) ×3
SUCTION TUBE FRAZIER 10FR DISP (MISCELLANEOUS) ×1 IMPLANT
SUT BROADBAND TAPE 2PK 2.3 (SUTURE) ×3 IMPLANT
SUT ETHILON 2 0 FS 18 (SUTURE) ×6 IMPLANT
SUT FIBERWIRE #2 38 T-5 BLUE (SUTURE) ×6
SUT MNCRL AB 3-0 PS2 18 (SUTURE) IMPLANT
SUT MON AB 2-0 CT1 27 (SUTURE) ×6 IMPLANT
SUT MON AB 2-0 CT1 36 (SUTURE) ×9 IMPLANT
SUT PDS AB 0 CT1 27 (SUTURE) ×6 IMPLANT
SUT PDS AB 2-0 CT1 27 (SUTURE) IMPLANT
SUT PROLENE 0 CT (SUTURE) IMPLANT
SUT PROLENE 3 0 PS 2 (SUTURE) IMPLANT
SUT VIC AB 0 CT1 27 (SUTURE)
SUT VIC AB 0 CT1 27XBRD ANBCTR (SUTURE) IMPLANT
SUT VIC AB 2-0 CT1 27 (SUTURE)
SUT VIC AB 2-0 CT1 TAPERPNT 27 (SUTURE) IMPLANT
SUT VIC AB 2-0 CT3 27 (SUTURE) IMPLANT
SUTURE FIBERWR #2 38 T-5 BLUE (SUTURE) ×2 IMPLANT
SYR CONTROL 10ML LL (SYRINGE) ×3 IMPLANT
SYSTEM FIXATN EXTRNL ARM OPTRM (Orthopedic Implant) ×1 IMPLANT
TOWEL GREEN STERILE (TOWEL DISPOSABLE) ×6 IMPLANT
TOWEL GREEN STERILE FF (TOWEL DISPOSABLE) IMPLANT
TUBE CONNECTING 12'X1/4 (SUCTIONS) ×1
TUBE CONNECTING 12X1/4 (SUCTIONS) ×2 IMPLANT
UNDERPAD 30X30 (UNDERPADS AND DIAPERS) ×3 IMPLANT
WATER STERILE IRR 1000ML POUR (IV SOLUTION) ×3 IMPLANT
YANKAUER SUCT BULB TIP NO VENT (SUCTIONS) ×3 IMPLANT

## 2020-02-17 NOTE — Discharge Instructions (Addendum)
[facility] Discharge Instructions   General Discharge Instructions  WEIGHT BEARING STATUS: Non-weightbearing on right upper extremity  RANGE OF MOTION/ACTIVITY: Leave ex-fix in place.   Wound Care: Keep purple incisional wound vac in place x 7 days. Okay to remove ace wrap dressing on post-op day #2 (Sunday 02/19/20).On Friday 02/24/20, you may remove the purple wound vac sponge. Incisions can be left open to air if there is no drainage. If incision continues to have drainage, follow wound care instructions below.   DVT/PE prophylaxis: Aspirin  Diet: as you were eating previously.  Can use over the counter stool softeners and bowel preparations, such as Miralax, to help with bowel movements.  Narcotics can be constipating.  Be sure to drink plenty of fluids  PAIN MEDICATION USE AND EXPECTATIONS  You have likely been given narcotic medications to help control your pain.  After a traumatic event that results in an fracture (broken bone) with or without surgery, it is ok to use narcotic pain medications to help control one's pain.  We understand that everyone responds to pain differently and each individual patient will be evaluated on a regular basis for the continued need for narcotic medications. Ideally, narcotic medication use should last no more than 6-8 weeks (coinciding with fracture healing).   As a patient it is your responsibility as well to monitor narcotic medication use and report the amount and frequency you use these medications when you come to your office visit.   We would also advise that if you are using narcotic medications, you should take a dose prior to therapy to maximize you participation.  IF YOU ARE ON NARCOTIC MEDICATIONS IT IS NOT PERMISSIBLE TO OPERATE A MOTOR VEHICLE (MOTORCYCLE/CAR/TRUCK/MOPED) OR HEAVY MACHINERY DO NOT MIX NARCOTICS WITH OTHER CNS (CENTRAL NERVOUS SYSTEM) DEPRESSANTS SUCH AS ALCOHOL   STOP SMOKING OR USING NICOTINE  PRODUCTS!!!!  As discussed nicotine severely impairs your body's ability to heal surgical and traumatic wounds but also impairs bone healing.  Wounds and bone heal by forming microscopic blood vessels (angiogenesis) and nicotine is a vasoconstrictor (essentially, shrinks blood vessels).  Therefore, if vasoconstriction occurs to these microscopic blood vessels they essentially disappear and are unable to deliver necessary nutrients to the healing tissue.  This is one modifiable factor that you can do to dramatically increase your chances of healing your injury.    (This means no smoking, no nicotine gum, patches, etc)  DO NOT USE NONSTEROIDAL ANTI-INFLAMMATORY DRUGS (NSAID'S)  Using products such as Advil (ibuprofen), Aleve (naproxen), Motrin (ibuprofen) for additional pain control during fracture healing can delay and/or prevent the healing response.  If you would like to take over the counter (OTC) medication, Tylenol (acetaminophen) is ok.  However, some narcotic medications that are given for pain control contain acetaminophen as well. Therefore, you should not exceed more than 4000 mg of tylenol in a day if you do not have liver disease.  Also note that there are may OTC medicines, such as cold medicines and allergy medicines that my contain tylenol as well.  If you have any questions about medications and/or interactions please ask your doctor/PA or your pharmacist.      ICE AND ELEVATE INJURED/OPERATIVE EXTREMITY  Using ice and elevating the injured extremity above your heart can help with swelling and pain control.  Icing in a pulsatile fashion, such as 20 minutes on and 20 minutes off, can be followed.    Do not place ice directly on skin. Make sure there  is a barrier between to skin and the ice pack.    Using frozen items such as frozen peas works well as the conform nicely to the are that needs to be iced.  USE AN ACE WRAP OR TED HOSE FOR SWELLING CONTROL  In addition to icing and elevation,  Ace wraps or TED hose are used to help limit and resolve swelling.  It is recommended to use Ace wraps or TED hose until you are informed to stop.    When using Ace Wraps start the wrapping distally (farthest away from the body) and wrap proximally (closer to the body)   Example: If you had surgery on your leg or thing and you do not have a splint on, start the ace wrap at the toes and work your way up to the thigh        If you had surgery on your upper extremity and do not have a splint on, start the ace wrap at your fingers and work your way up to the upper arm  IF YOU ARE IN A SPLINT OR CAST DO NOT REMOVE IT FOR ANY REASON   If your splint gets wet for any reason please contact the office immediately. You may shower in your splint or cast as long as you keep it dry.  This can be done by wrapping in a cast cover or garbage back (or similar)  Do Not stick any thing down your splint or cast such as pencils, money, or hangers to try and scratch yourself with.  If you feel itchy take benadryl as prescribed on the bottle for itching  IF YOU ARE IN A CAM BOOT (BLACK BOOT)  You may remove boot periodically. Perform daily dressing changes as noted below.  Wash the liner of the boot regularly and wear a sock when wearing the boot. It is recommended that you sleep in the boot until told otherwise   CALL THE OFFICE WITH ANY QUESTIONS OR CONCERNS: [phone]   VISIT OUR WEBSITE FOR ADDITIONAL INFORMATION: [email or web address]      Discharge Pin Site Instructions  Dress pins daily with Kerlix roll starting on POD 2. Wrap the Kerlix so that it tamps the skin down around the pin-skin interface to prevent/limit motion of the skin relative to the pin.  (Pin-skin motion is the primary cause of pain and infection related to external fixator pin sites).  Remove any crust or coagulum that may obstruct drainage with a saline moistened gauze or soap and water.  After POD 3, if there is no discernable drainage  on the pin site dressing, the interval for change can by increased to every other day.  You may shower with the fixator, cleaning all pin sites gently with soap and water.  If you have a surgical wound this needs to be completely dry and without drainage before showering.  The extremity can be lifted by the fixator to facilitate wound care and transfers.  Notify the office/Doctor if you experience increasing drainage, redness, or pain from a pin site, or if you notice purulent (thick, snot-like) drainage.  Discharge Wound Care Instructions  Do NOT apply any ointments, solutions or lotions to pin sites or surgical wounds.  These prevent needed drainage and even though solutions like hydrogen peroxide kill bacteria, they also damage cells lining the pin sites that help fight infection.  Applying lotions or ointments can keep the wounds moist and can cause them to breakdown and open up as well. This can increase the  risk for infection. When in doubt call the office.  Surgical incisions should be dressed daily.  If any drainage is noted, use one layer of adaptic, then gauze, Kerlix, and an ace wrap.  Once the incision is completely dry and without drainage, it may be left open to air out.  Showering may begin 36-48 hours later.  Cleaning gently with soap and water.  Traumatic wounds should be dressed daily as well.    One layer of adaptic, gauze, Kerlix, then ace wrap.  The adaptic can be discontinued once the draining has ceased    If you have a wet to dry dressing: wet the gauze with saline the squeeze as much saline out so the gauze is moist (not soaking wet), place moistened gauze over wound, then place a dry gauze over the moist one, followed by Kerlix wrap, then ace wrap.

## 2020-02-17 NOTE — Anesthesia Procedure Notes (Signed)
Procedure Name: Intubation Date/Time: 02/17/2020 9:55 AM Performed by: Rosiland Oz, CRNA Pre-anesthesia Checklist: Patient identified, Emergency Drugs available, Suction available, Patient being monitored and Timeout performed Patient Re-evaluated:Patient Re-evaluated prior to induction Oxygen Delivery Method: Circle system utilized Preoxygenation: Pre-oxygenation with 100% oxygen Induction Type: IV induction Ventilation: Mask ventilation without difficulty Laryngoscope Size: Glidescope and 4 Grade View: Grade II Tube type: Oral Tube size: 7.5 mm Number of attempts: 1 Airway Equipment and Method: Stylet and Video-laryngoscopy Placement Confirmation: ETT inserted through vocal cords under direct vision,  positive ETCO2 and breath sounds checked- equal and bilateral Secured at: 23 cm Tube secured with: Tape Dental Injury: Teeth and Oropharynx as per pre-operative assessment  Difficulty Due To: Difficulty was anticipated and Difficult Airway- due to large tongue

## 2020-02-17 NOTE — Op Note (Signed)
Orthopaedic Surgery Operative Note (CSN: [id number] ) Date of Surgery: 02/17/2020  Admit Date: 02/17/2020   Diagnoses: Pre-Op Diagnoses: Recurrent dislocation of right elbow Transolecranon fracture-dislocation s/p ORIF   Post-Op Diagnosis: Same  Procedures: 1. CPT 24586-Revision fixation of right proximal ulnar fracture dislocation 2. CPT 24343-Repair lateral ligament complex 3. CPT 20690-Hinged external fixator placement to right upper extremity 4. CPT 20680-Removal of hardware right elbow  Surgeons : Shona Needles, MD  Assistant: Patrecia Pace, PA-C  Location: OR 6   Anesthesia:General with regional anesthesia  Antibiotics: Ancef 2g preop with redosing at 4 hours. 1 gm of vancomycin and 1.2 gm of tobramycin powder placed topically.   Tourniquet time: Total Tourniquet Time Documented: Upper Arm (Right) - 92 minutes Total: Upper Arm (Right) - 92 minutes    Estimated Blood WUJW:119 mL  Complications:None   Specimens:None   Implants: Implant Name Type Inv. Item Serial No. Manufacturer Lot No. LRB No. Used Action  PLATE OLECRANON LRG - [id number] Plate PLATE OLECRANON LRG  [employer]IMMER [employer]ON(ORTH,TRAU,BIO,SG) ON STERILE TRAY Right 1 Explanted  SCREW CORT 3.5X32 - [id number] Screw SCREW CORT 3.5X32  ZIMMER RECON(ORTH,TRAU,BIO,SG) ON STERILE TRAY Right 1 Explanted  SCREW CORTICAL 3.5X28MM - [id number] Screw SCREW CORTICAL 3.5X28MM  ZIMMER RECON(ORTH,TRAU,BIO,SG) ON STERILE TRAY Right 1 Explanted  SCREW LOCK CORT STAR 3.5X16 - [id number] Screw SCREW LOCK CORT STAR 3.5X16  ZIMMER [employer]ON(ORTH,TRAU,BIO,SG) ON STERILE TRAY Right 2 Explanted  SCREW LOCK CORT STAR 3.5X18 - [id number] Screw SCREW LOCK CORT STAR 3.5X18  ZIMMER [employer]ON(ORTH,TRAU,BIO,SG) ON STERILE TRAY Right 1 Explanted  SCREW LOCK CORT STAR 3.5X20 - [id number] Screw SCREW LOCK CORT STAR 3.5X20  ZIMMER RECON(ORTH,TRAU,BIO,SG) ON STERILE TRAY Right 1 Explanted  SCREW LOCK CORT STAR 3.5X22 - [id number] Screw SCREW LOCK CORT STAR 3.5X22   ZIMMER RECON(ORTH,TRAU,BIO,SG) ON STERILE TRAY Right 1 Explanted  SCREW LOCK CORT STAR 3.5X28 - [id number] Screw SCREW LOCK CORT STAR 3.5X28  ZIMMER [employer]ON(ORTH,TRAU,BIO,SG) ON STERILE TRAY Right 1 Explanted  SCREW LOW PROFILE 3.5X48MM - [id number] Screw SCREW LOW PROFILE 3.5X48MM  ZIMMER RECON(ORTH,TRAU,BIO,SG) ON STERILE TRAY Right 2 Explanted  WASHER 3.5MM - [id number] Orthopedic Implant WASHER 3.5MM  ZIMMER [employer]ON(ORTH,TRAU,BIO,SG) ON STERILE TRAY Right 1 Explanted  PLATE OLECRANON LRG - [id number] Plate PLATE OLECRANON LRG  Z[employer]MER [employer]ON(ORTH,TRAU,BIO,SG)  Right 1 Implanted  SCREW CORTICAL LOW PROF 3.5X20 - [id number] Screw SCREW CORTICAL LOW PROF 3.5X20  ZIMMER [employer]ON(ORTH,TRAU,BIO,SG)  Right 3 Implanted  SCREW CORTICAL 3.5X24MM - [id number] Screw SCREW CORTICAL 3.5X24MM  ZIMMER [employer]ON(ORTH,TRAU,BIO,SG)  Right 1 Implanted  SCREW CORT 3.5X26 - [id number] Screw SCREW CORT 3.5X26  ZIMMER [employer]ON(ORTH,TRAU,BIO,SG)  Right 1 Implanted  2.5 y-plate      Right 2 Implanted  SCREW CORT 4.5/3.5 DIA 100/40 - [id number] Screw SCREW CORT 4.5/3.5 DIA 100/40  ZIMMER [employer]ON(ORTH,TRAU,BIO,SG)  Right 2 Implanted  optirom central component      Right 1 Implanted  Humeral bone screw 120/69m      Right 2 Implanted  OptiRom Proximal Arm      Right 1 Implanted  Targeting Disc      Right 1 Implanted  PROS 1/3 TUB PL 3H 79M - [id number] Implant PROS 1/3 TUB PL 3H 79M  SYNTHES TRAUMA  Right 2 Implanted  3.261mtargeting wire      Right 1 Implanted  Right Distal component optirom      Right 1 Implanted  ROD FIBER CARBON 9.5X200MM - [id number] ROD FIBER CARBON 9.5X200MM  ZIMMER RECON(ORTH,TRAU,BIO,SG)  Right 1 Implanted  ANCHOR LINK QUATTRO KNOT  2.9MM - [id number] Anchor [address] Fredda Hammed 2.9MM  ZIMMER RECON(ORTH,TRAU,BIO,SG) [id number] Right 1 Implanted  [address] Glori Luis 2.9MM - [id number] Anchor ANCHOR Fredda Hammed 2.9MM  ZIMMER RECON(ORTH,TRAU,BIO,SG) [id number] Right 1 Implanted  KIT IMPLANT JUGGERKNOT SZ2 -  [id number] Orthopedic Implant KIT IMPLANT JUGGERKNOT SZ2  ZIMMER RECON(ORTH,TRAU,BIO,SG) [id number] Right 1 Implanted  SCREW LOW PROFILE 18MMX3.5MM - [id number] Screw SCREW LOW PROFILE 18MMX3.5MM  ZIMMER RECON(ORTH,TRAU,BIO,SG)  Right 2 Implanted  SCREW LOCK CORT STAR 3.5X10 - [id number] Screw SCREW LOCK CORT STAR 3.5X10  ZIMMER RECON(ORTH,TRAU,BIO,SG)  Right 1 Implanted  SCREW LOCK CORT STAR 3.5X18 - [id number] Screw SCREW LOCK CORT STAR 3.5X18  ZIMMER RECON(ORTH,TRAU,BIO,SG)  Right 2 Implanted  SCREW LOCK CORT STAR 3.5X20 - [id number] Screw SCREW LOCK CORT STAR 3.5X20  ZIMMER RECON(ORTH,TRAU,BIO,SG)  Right 1 Implanted  SCREW LOCK CORT STAR 3.5X26 - [id number] Screw SCREW LOCK CORT STAR 3.5X26  ZIMMER RECON(ORTH,TRAU,BIO,SG)  Right 1 Implanted  JUGGERKNOT W/TAP NEEDLE SHOU - [id number] Anchor JUGGERKNOT W/TAP NEEDLE SHOU  ZIMMER RECON(ORTH,TRAU,BIO,SG)  Right 1 Implanted     Indications for Surgery: 39 year old right-hand-dominant male that sustained a right transolecranon fracture dislocation that was treated by Dr. Grandville Silos with open reduction internal fixation with repair of medial and lateral collateral ligaments.  Unfortunately in his postoperative course he developed instability and dislocation of his elbow.  Due to the length of time from his last surgery and the highly unstable nature of his injury I recommended proceeding to the operating room for revision fixation versus repair and external fixation of his right elbow.  Risks and benefits were discussed with the patient and his wife.  Risks include but not limited to infection, bleeding, malunion, nonunion, elbow stiffness, elbow instability, heterotopic ossification, posttraumatic arthritis, nerve and blood vessel injury, need for revision surgery, even the possibility of anesthetic complications.  The patient agreed to proceed with surgery and consent was obtained.  Operative Findings: 1.  Highly unstable right elbow fracture dislocation with  continued subluxation of ulna humeral joint and radial head treated with a revision fixation of proximal ulna 2.  Lateral ligament reconstruction using Quattro suture anchors and Fibertape with reinforcement using juggernaut anchors insertion of the LUCL 3.  Placement of Optirom hinged external fixator with statically locked position.  Procedure: The patient was identified in the preoperative holding area. Consent was confirmed with the patient and their family and all questions were answered. The operative extremity was marked after confirmation with the patient. he was then brought back to the operating room by our anesthesia colleagues.  He was placed under general anesthetic and carefully transferred over to a radiolucent flat top table.  Fluoroscopic imaging was obtained which showed subluxation of his ulnohumeral joint along with a dislocation of his radiocapitellar joint.  Due to his appearance of his joint I felt that the reopening his previous incision would be most appropriate to be able access both lateral and medial sides.  The right upper extremity was prepped and draped in usual sterile fashion.  A timeout was performed to verify the patient, the procedure, and the extremity.  Preoperative antibiotics were dosed.  I reopened his previous incision and carried it down through skin and subcutaneous tissue.  I proceeded to dissect out the ulnar nerve and I protected this throughout the case.  I placed a sterile tourniquet prior to this and elevated to 250 mmHg.  Please see tourniquet time as noted above.  From the direction of his radial head I felt that there was  an apex posterior angulation to the proximal ulna.  Felt that I could not revise the elbow without addressing that possible deformity.  The radial head continued to dislocate posteriorly.  As result I remove the plate and screws that was in place.  And I proceeded to remove the callus that was healing at the main fracture site to be able  to recreate the anatomic alignment of the proximal ulna.  I chose a [employer] olecranon plate and positioned in the same position in the proximal portion of the elbow and olecranon.  However I did contoured this with a apex anterior angulation and restored the anatomic alignment to the proximal ulna.  I placed on nonlocking screws into the ulnar shaft and nonlocking screw into the olecranon the point plate flush to bone.  I then placed nonlocking screws in the remainder of the shaft.  And a locking screws into the proximal ulna.  I did have some continued difficulty with keeping the radial head in place.  As result I attempted to remove the plate and contoured more.  This was successful however he continued to sublux out with supination.  I stressed the elbow under fluoroscopic imaging and did not feel that the medial coronoid fragments were unstable and I felt that there was enough healing repair was not needed.  I also felt that dissecting anterior to the coronoid would significantly increase the risk of heterotopic ossification and further stiffness of his elbow.  I felt that lateral ligament reconstruction would be the most appropriate.  I then turned my attention to placing the external fixator.  I measured approximately 10 cm above the lateral epicondyle and made a percutaneous incision and used a drill bit to drill through the humerus gain bicortical fixation.  I then placed the threaded half pin through the humeral shaft for the Optirom external fixator.  The humeral portion of the external fixator was attached to the first guidepin and the process was repeated just proximal to the first half pin.  I then turned my attention to the ulna.  I made percutaneous incisions and bicortically drilled through the ulna and placed a threaded half pins.  Pin to bar clamps were placed and I connected the 9.5 mm carbon fiber rod to these clamps.  I left the hinge off to be able to pare the lateral ligament  complex.  A full-thickness skin flap was developed laterally.  Kocher interval was developed to the lateral elbow.  This was split in line had to visualize the radial head and the radiocapitellar joint.  There is significant valgus gapping of the lateral side of the elbow.  We first started by placing FiberWire sutures through the annular ligament and interval for closure at the end of the procedure.  We then identified the sublime tubercle on the ulna and placed a Quattro suture anchor with fiber tape within.  We then tunneled the fiber tape underneath the extensor wad and placed the other suture anchor at the medial epicondyle.  We placed the elbow in valgus and tightened the suture tape to reconstruct reconstitute the lateral ligament complex.  To reinforce this we placed a Juggarnaut suture anchor at the lateral epicondyle and repaired the lateral ligament complex and the extensor wad to the suture anchor.  The radial head did not sublux after the repair.  As result I identified the epicondylar axis with the hinge.  I connected to the humeral and ulnar portions of the exfix and statically locked it in  a position of reduction of the ulnohumeral joint and radiocapitellar joint.  Final fluoroscopic imaging was obtained.  The incision was copiously irrigated.  A gram of vancomycin powder 1.2 g of tobramycin powder were placed into the incision.  A layer closure consisting of 0 PDS, 2-0 Monocryl and 3-0 nylon was used.  An incisional wound VAC was placed.  The arm was wrapped carefully with sterile cast padding and Ace wrap.  The patient was then awoken from anesthesia and taken to the PACU in stable condition.  Post Op Plan/Instructions: Patient will be nonweightbearing to the right upper extremity.  He will return in a week and a half for x-rays as well as suture removal.  No DVT prophylaxis is needed in this healthy upper extremity patient.  Patient will be discharged home from the PACU.  I was present  and performed the entire surgery.  Patrecia Pace, PA-C did assist me throughout the case. An assistant was necessary given the difficulty in approach, maintenance of reduction and ability to instrument the fracture.   Katha Hamming, MD [facility]

## 2020-02-17 NOTE — Transfer of Care (Signed)
Immediate Anesthesia Transfer of Care Note  Patient: Joseph [name]  Procedure(s) Performed: REVISION OPEN REDUCTION INTERNAL FIXATION (ORIF) ELBOW/OLECRANON FRACTURE (Right Elbow)  Patient Location: PACU  Anesthesia Type:General  Level of Consciousness: awake, alert  and oriented  Airway & Oxygen Therapy: Patient Spontanous Breathing and Patient connected to nasal cannula oxygen  Post-op Assessment: Report given to RN and Post -op Vital signs reviewed and stable  Post vital signs: Reviewed and stable  Last Vitals:  Vitals Value Taken Time  BP 135/64 02/17/20 1601  Temp    Pulse 111 02/17/20 1606  Resp 18 02/17/20 1606  SpO2 99 % 02/17/20 1606  Vitals shown include unvalidated device data.  Last Pain:  Vitals:   02/17/20 1600  TempSrc:   PainSc: (P) Asleep         Complications: No apparent anesthesia complications

## 2020-02-17 NOTE — Anesthesia Procedure Notes (Signed)
Anesthesia Regional Block: Supraclavicular block   Pre-Anesthetic Checklist: ,, timeout performed, Correct Patient, Correct Site, Correct Laterality, Correct Procedure, Correct Position, site marked, Risks and benefits discussed,  Surgical consent,  Pre-op evaluation,  At surgeon's request and post-op pain management  Laterality: Right  Prep: chloraprep       Needles:  Injection technique: Single-shot  Needle Type: Echogenic Stimulator Needle     Needle Length: 9cm  Needle Gauge: 21   Needle insertion depth: 8 cm   Additional Needles:   Procedures:,,,, ultrasound used (permanent image in chart),,,,  Narrative:  Start time: 02/17/2020 9:20 AM End time: 02/17/2020 9:26 AM Injection made incrementally with aspirations every 5 mL.  Performed by: Personally  Anesthesiologist: Mal Amabile, MD  Additional Notes: Timeout performed. Patient sedated. Relevant anatomy ID'd using [place]. Incremental 2-69ml injection of LA with frequent aspiration. Patient tolerated procedure well.

## 2020-02-17 NOTE — H&P (Signed)
[facility] ([facility]) H&P  Patient ID: Joseph [name] MRN: [id number] DOB/AGE: February 22, 1981 39 y.o.  Reason for Surgery: Right elbow fracture/dislocation  HPI: Joseph [name] is an 39 y.o. male who presents for surgery for his right elbow.  He underwent open reduction internal fixation with Dr. Grandville Silos in March 5 of this year.  Subsequently had a subluxation event on post imaging earlier this week.  Due to the unstable nature of his injury and the fact that he has failed his surgical repair recommended proceeding with surgical fixation.  He presents for revision fixation of his right elbow.  Patient is right-hand dominant.  [name] very active with his upper extremity.  Does have a history of psoriatic arthritis for which he takes injectable medication with the last being March 29.  Past Medical History:  Diagnosis Date  . Acute sinus infection 11/07/2014  . Arthritis   . Blood transfusion without reported diagnosis    At [facility]   . Difficult intubation 01/13/2020   difficult airway in OR (per Dr Laurey Arrow)  . Iron deficiency anemia 09/21/2014  . Psoriatic arthritis ([place])   . Psoriatic arthritis ([place])   . 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 Percival Spanish 02/2009 with echo, as needed f/u    Past Surgical History:  Procedure Laterality Date  . COLONOSCOPY WITH ESOPHAGOGASTRODUODENOSCOPY (EGD)    . HERNIA REPAIR  1994   umbicial  . 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 LIGAMENT REPAIR Right 01/13/2020   Procedure: COLLATERAL LIGAMENT REPAIRS;  Surgeon: Milly Jakob, MD;  Location: [facility];  Service: Orthopedics;  Laterality: Right;  . ORIF ULNAR FRACTURE Right 01/13/2020   Procedure: OPEN TREATMENT OF COMPLEX RIGHT PROXIMAL ULNA FRACTURE;  Surgeon: Milly Jakob, MD;  Location: [facility];   Service: Orthopedics;  Laterality: Right;  PROCEDURE: OPEN TREATMENT OF COMPLEX RIGHT PROXIMAL ULNA FRACTURE WITH POSSIBLE COLLATERAL LIGAMENT REPAIRS  LENGTH OF SURGERY: 2.5 HOURS MAC + REGIONAL BLOCK    Family History  Problem Relation Age of Onset  . Pulmonary fibrosis Mother   . Hypertension Mother   . Pancreatic cancer Maternal Grandmother   . Rheum arthritis Paternal Aunt   . Cancer Paternal Grandmother   . Breast cancer Paternal Grandmother   . COPD Paternal Grandfather   . CAD Paternal Grandfather   . Colon cancer Neg Hx   . Prostate cancer Neg Hx   . Diabetes Neg Hx   . Thyroid disease Neg Hx     Social History:  reports that he has never smoked. He has never used smokeless tobacco. He reports that he does not drink alcohol or use drugs.  Allergies:  Allergies  Allergen Reactions  . Sulfonamide Derivatives Other (See Comments)    UNSPECIFIED  . Bee Venom Palpitations    unknown    Medications:  No current facility-administered medications on file prior to encounter.   Current Outpatient Medications on File Prior to Encounter  Medication Sig Dispense Refill  . acetaminophen (TYLENOL) 325 MG tablet Take 2 tablets (650 mg total) by mouth every 6 (six) hours. (Patient taking differently: Take 650 mg by mouth every 6 (six) hours as needed for mild pain or moderate pain. )    . ferrous sulfate 325 (65 FE) MG tablet Take 325 mg by mouth daily with  breakfast.    . folic acid (FOLVITE) 1 MG tablet Take 1 tablet (1 mg total) by mouth daily. 90 tablet 3  . meloxicam (MOBIC) 15 MG tablet Take 1 tablet (15 mg total) by mouth daily. 30 tablet 1  . methotrexate 50 MG/2ML injection INJECT 0.6 MILLILITER (ML) UNDER THE SKIN ONCE A WEEK 8 mL 0  . Multiple Vitamins-Minerals (MULTIVITAMIN WITH MINERALS) tablet Take 1 tablet by mouth daily.    Marland Kitchen oxyCODONE (ROXICODONE) 5 MG immediate release tablet Take 1 tablet (5 mg total) by mouth every 6 (six) hours as needed for severe pain. 20  tablet 0  . Secukinumab (COSENTYX SENSOREADY PEN) 150 MG/ML SOAJ Inject 150 mg into the skin every 14 (fourteen) days. --hold for 2 weeks following surgery 6 pen 0  . clobetasol cream (TEMOVATE) 0.05 % Apply topically 2 (two) times daily as needed. (Patient taking differently: Apply 1 application topically 2 (two) times daily as needed (irritation). ) 30 g 1    ROS: Constitutional: No fever or chills Vision: No changes in vision ENT: No difficulty swallowing CV: No chest pain Pulm: No SOB or wheezing GI: No nausea or vomiting GU: No urgency or inability to hold urine Skin: No poor wound healing Neurologic: No numbness or tingling Psychiatric: No depression or anxiety Heme: No bruising Allergic: No reaction to medications or food   Exam: Blood pressure (!) 163/99, pulse 87, temperature 97.7 F (36.5 C), temperature source Oral, resp. rate 20, height _0  (1.778 m), weight 133.8 kg, SpO2 95 %. General: No acute distress Orientation: Awake alert and oriented x3 Mood and Affect: Cooperative and pleasant Gait: Within normal limits Coordination and balance: Within normal limits  Right upper extremity: Splint is in place.  Compartment soft compressible.  Unable to tolerate any range of motion of the elbow.  Previous surgical incision is healed well without any signs of infection.  [name] neurovascular intact in median, radial and ulnar nerve distribution.  He has brisk cap refill with 2+ radial pulse.  Left upper extremity: Skin without lesions. No tenderness to palpation. Full painless ROM, full strength in each muscle groups without evidence of instability.   Medical Decision Making: Data: Imaging: X-rays reviewed which shows a subluxation with displacement of the medial coronoid fragments.  Labs: No results found for this or any previous visit (from the past 24 hour(s)).  Imaging or Labs ordered: CT scan of right elbow  Medical history and chart was reviewed and case discussed  with medical provider.  Assessment/Plan: 39 year old male with failed fixation of right elbow fracture dislocation presents for revision fixation  I discussed at length risks and benefits of proceeding with surgical intervention.  We will plan for a revision fixation of his olecranon with possible hinged external fixator.  Risks included but not limited to bleeding, infection, malunion, nonunion, hardware failure, hardware irritation, nerve and blood vessel injury, posttraumatic arthritis, elbow stiffness, heterotopic ossification, even the possibility of anesthetic complications.  He agrees to proceed with surgery and consent was obtained.  Shona Needles, MD [facility] [phone] (office) [email or web address]

## 2020-02-17 NOTE — Anesthesia Postprocedure Evaluation (Signed)
Anesthesia Post Note  Patient: Joseph [name]  Procedure(s) Performed: REVISION OPEN REDUCTION INTERNAL FIXATION (ORIF) ELBOW/OLECRANON FRACTURE (Right Elbow)     Patient location during evaluation: PACU Anesthesia Type: General Level of consciousness: awake and alert Pain management: pain level controlled Vital Signs Assessment: post-procedure vital signs reviewed and stable Respiratory status: spontaneous breathing, nonlabored ventilation, respiratory function stable and patient connected to nasal cannula oxygen Cardiovascular status: blood pressure returned to baseline and stable Postop Assessment: no apparent nausea or vomiting Anesthetic complications: no    Last Vitals:  Vitals:   02/17/20 1630 02/17/20 1645  BP: 119/70 119/73  Pulse: (!) 122 (!) 110  Resp: 17 19  Temp:  36.4 C  SpO2: 93% 99%    Last Pain:  Vitals:   02/17/20 1645  TempSrc:   PainSc: Asleep                 Yavier Snider DAVID

## 2020-02-24 ENCOUNTER — Encounter: Payer: Self-pay | Admitting: *Deleted

## 2020-03-06 ENCOUNTER — Encounter: Payer: Self-pay | Admitting: Student

## 2020-03-06 DIAGNOSIS — S53104A Unspecified dislocation of right ulnohumeral joint, initial encounter: Secondary | ICD-10-CM | POA: Insufficient documentation

## 2020-03-20 ENCOUNTER — Ambulatory Visit: Payer: Self-pay | Admitting: Student

## 2020-03-20 DIAGNOSIS — S52031P Displaced fracture of olecranon process with intraarticular extension of right ulna, subsequent encounter for closed fracture with malunion: Secondary | ICD-10-CM

## 2020-03-21 ENCOUNTER — Other Ambulatory Visit: Payer: 59

## 2020-03-21 ENCOUNTER — Encounter (HOSPITAL_COMMUNITY): Payer: Self-pay | Admitting: Student

## 2020-03-21 ENCOUNTER — Other Ambulatory Visit (HOSPITAL_COMMUNITY)
Admission: RE | Admit: 2020-03-21 | Discharge: 2020-03-21 | Disposition: A | Discharge status: Home / Self Care | Payer: 59 | Source: Ambulatory Visit | Attending: Student | Admitting: Student

## 2020-03-21 ENCOUNTER — Other Ambulatory Visit: Payer: Self-pay

## 2020-03-21 DIAGNOSIS — Z01812 Encounter for preprocedural laboratory examination: Secondary | ICD-10-CM | POA: Diagnosis not present

## 2020-03-21 DIAGNOSIS — Z20822 Contact with and (suspected) exposure to covid-19: Secondary | ICD-10-CM | POA: Insufficient documentation

## 2020-03-21 LAB — SARS CORONAVIRUS 2 (TAT 6-24 HRS): SARS Coronavirus 2: NEGATIVE

## 2020-03-21 NOTE — Anesthesia Preprocedure Evaluation (Addendum)
Anesthesia Evaluation  Patient identified by MRN, date of birth, ID band Patient awake    Reviewed: Allergy & Precautions, NPO status , Patient's Chart, lab work & pertinent test results  History of Anesthesia Complications (+) DIFFICULT AIRWAY and history of anesthetic complications  Airway Mallampati: IV  TM Distance: >3 FB Neck ROM: Full    Dental  (+) Chipped,    Pulmonary neg pulmonary ROS,    Pulmonary exam normal        Cardiovascular hypertension, Normal cardiovascular exam     Neuro/Psych negative neurological ROS  negative psych ROS   GI/Hepatic negative GI ROS, Neg liver ROS,   Endo/Other  Morbid obesity  Renal/GU negative Renal ROS  negative genitourinary   Musculoskeletal  (+) Arthritis ,   Abdominal   Peds  Hematology negative hematology ROS (+)   Anesthesia Other Findings Day of surgery medications reviewed with patient.  Reproductive/Obstetrics negative OB ROS                           Anesthesia Physical Anesthesia Plan  ASA: III  Anesthesia Plan: General   Post-op Pain Management:    Induction:   PONV Risk Score and Plan: 3 and Treatment may vary due to age or medical condition, Ondansetron, Dexamethasone and Midazolam  Airway Management Planned: LMA  Additional Equipment: None  Intra-op Plan:   Post-operative Plan: Extubation in OR  Informed Consent: I have reviewed the patients History and Physical, chart, labs and discussed the procedure including the risks, benefits and alternatives for the proposed anesthesia with the patient or authorized representative who has indicated his/her understanding and acceptance.     Dental advisory given  Plan Discussed with: CRNA  Anesthesia Plan Comments:       Anesthesia Quick Evaluation

## 2020-03-21 NOTE — Progress Notes (Signed)
Patient denies shortness of breath, fever, cough and chest pain.  PCP - Dr Prince Rome Cardiologist - n/a  Chest x-ray - n/a EKG - n/a Stress Test - n/a ECHO - 02/20/2009 Cardiac Cath - n/a  ERAS: Clears til 0430 DOS, no drink  Anesthesia review:  Yes.   Dr Maple Hudson informed of difficult airway in past per Dr Malen Gauze (01/13/20 note in Epic).  Dr Malen Gauze used a Glidescope with T4 blade used.  STOP now taking any Aspirin (unless otherwise instructed by your surgeon), Aleve, Naproxen, Ibuprofen, Motrin, Advil, Goody's, BC's, all herbal medications, fish oil, and all vitamins.   Coronavirus Screening Covid test on 03/21/20 is pending. Have you experienced the following symptoms:  Cough yes/no: No Fever (>100.32F)  yes/no: No Runny nose yes/no: No Sore throat yes/no: No Difficulty breathing/shortness of breath  yes/no: No  Have you traveled in the last 14 days and where? yes/no: No  Patient verbalized understanding of instructions that were given via phone.

## 2020-03-22 ENCOUNTER — Ambulatory Visit (HOSPITAL_COMMUNITY): Payer: No Typology Code available for payment source

## 2020-03-22 ENCOUNTER — Ambulatory Visit (HOSPITAL_COMMUNITY): Payer: No Typology Code available for payment source | Admitting: Certified Registered Nurse Anesthetist

## 2020-03-22 ENCOUNTER — Other Ambulatory Visit: Payer: Self-pay

## 2020-03-22 ENCOUNTER — Ambulatory Visit (HOSPITAL_COMMUNITY)
Admission: RE | Admit: 2020-03-22 | Discharge: 2020-03-22 | Disposition: A | Discharge status: Home / Self Care | Payer: No Typology Code available for payment source | Source: Ambulatory Visit | Attending: Student | Admitting: Student

## 2020-03-22 ENCOUNTER — Encounter (HOSPITAL_COMMUNITY)
Admission: RE | Disposition: A | Discharge status: Home / Self Care | Payer: Self-pay | Source: Ambulatory Visit | Attending: Student

## 2020-03-22 DIAGNOSIS — S52031P Displaced fracture of olecranon process with intraarticular extension of right ulna, subsequent encounter for closed fracture with malunion: Secondary | ICD-10-CM | POA: Insufficient documentation

## 2020-03-22 DIAGNOSIS — X58XXXD Exposure to other specified factors, subsequent encounter: Secondary | ICD-10-CM | POA: Diagnosis not present

## 2020-03-22 DIAGNOSIS — S52001D Unspecified fracture of upper end of right ulna, subsequent encounter for closed fracture with routine healing: Secondary | ICD-10-CM | POA: Diagnosis not present

## 2020-03-22 DIAGNOSIS — M1990 Unspecified osteoarthritis, unspecified site: Secondary | ICD-10-CM | POA: Insufficient documentation

## 2020-03-22 DIAGNOSIS — Z6841 Body Mass Index (BMI) 40.0 and over, adult: Secondary | ICD-10-CM | POA: Insufficient documentation

## 2020-03-22 DIAGNOSIS — I10 Essential (primary) hypertension: Secondary | ICD-10-CM | POA: Insufficient documentation

## 2020-03-22 DIAGNOSIS — Z419 Encounter for procedure for purposes other than remedying health state, unspecified: Secondary | ICD-10-CM

## 2020-03-22 HISTORY — PX: EXTERNAL FIXATION REMOVAL: SHX5040

## 2020-03-22 HISTORY — DX: Psoriasis, unspecified: L40.9

## 2020-03-22 LAB — HEMOGLOBIN: Hemoglobin: 12.8 g/dL — ABNORMAL LOW (ref 13.0–17.0)

## 2020-03-22 SURGERY — REMOVAL, EXTERNAL FIXATION DEVICE, UPPER EXTREMITY
Anesthesia: General | Laterality: Right

## 2020-03-22 MED ORDER — HYDROCODONE-ACETAMINOPHEN 5-325 MG PO TABS: 1.0000 | ORAL_TABLET | Freq: Three times a day (TID) | ORAL | 0 refills | Status: DC | PRN

## 2020-03-22 MED ORDER — 0.9 % SODIUM CHLORIDE (POUR BTL) OPTIME
TOPICAL | Status: DC | PRN
  Administered 2020-03-22: 1000 mL

## 2020-03-22 MED ORDER — FENTANYL CITRATE (PF) 250 MCG/5ML IJ SOLN
INTRAMUSCULAR | Status: AC
  Filled 2020-03-22: qty 5

## 2020-03-22 MED ORDER — FENTANYL CITRATE (PF) 250 MCG/5ML IJ SOLN
INTRAMUSCULAR | Status: DC | PRN
  Administered 2020-03-22: 100 ug via INTRAVENOUS

## 2020-03-22 MED ORDER — ACETAMINOPHEN 500 MG PO TABS: 1000.0000 mg | ORAL_TABLET | Freq: Once | ORAL | Status: AC

## 2020-03-22 MED ORDER — PROPOFOL 10 MG/ML IV BOLUS
INTRAVENOUS | Status: AC
  Filled 2020-03-22: qty 40

## 2020-03-22 MED ORDER — LIDOCAINE 2% (20 MG/ML) 5 ML SYRINGE
INTRAMUSCULAR | Status: DC | PRN
  Administered 2020-03-22: 100 mg via INTRAVENOUS

## 2020-03-22 MED ORDER — FENTANYL CITRATE (PF) 100 MCG/2ML IJ SOLN: 25.0000 ug | INTRAMUSCULAR | Status: DC | PRN

## 2020-03-22 MED ORDER — OXYCODONE HCL 5 MG/5ML PO SOLN: 5.0000 mg | Freq: Once | ORAL | Status: DC | PRN

## 2020-03-22 MED ORDER — CEFAZOLIN SODIUM-DEXTROSE 2-3 GM-%(50ML) IV SOLR
INTRAVENOUS | Status: DC | PRN
  Administered 2020-03-22: 2 g via INTRAVENOUS

## 2020-03-22 MED ORDER — LACTATED RINGERS IV SOLN: INTRAVENOUS | Status: DC | PRN

## 2020-03-22 MED ORDER — PROMETHAZINE HCL 25 MG/ML IJ SOLN: 6.2500 mg | INTRAMUSCULAR | Status: DC | PRN

## 2020-03-22 MED ORDER — PHENYLEPHRINE HCL (PRESSORS) 10 MG/ML IV SOLN
INTRAVENOUS | Status: DC | PRN
Start: 2020-03-22 — End: 2020-03-22
  Administered 2020-03-22 (×2): 80 ug via INTRAVENOUS

## 2020-03-22 MED ORDER — MIDAZOLAM HCL 2 MG/2ML IJ SOLN
INTRAMUSCULAR | Status: AC
  Filled 2020-03-22: qty 2

## 2020-03-22 MED ORDER — OXYCODONE HCL 5 MG PO TABS: 5.0000 mg | ORAL_TABLET | Freq: Once | ORAL | Status: DC | PRN

## 2020-03-22 MED ORDER — PROPOFOL 10 MG/ML IV BOLUS
INTRAVENOUS | Status: DC | PRN
  Administered 2020-03-22: 200 mg via INTRAVENOUS

## 2020-03-22 MED ORDER — DEXAMETHASONE SODIUM PHOSPHATE 10 MG/ML IJ SOLN
INTRAMUSCULAR | Status: DC | PRN
  Administered 2020-03-22: 5 mg via INTRAVENOUS

## 2020-03-22 MED ORDER — ACETAMINOPHEN 500 MG PO TABS
ORAL_TABLET | ORAL | Status: AC
  Administered 2020-03-22: 1000 mg via ORAL
  Filled 2020-03-22: qty 2

## 2020-03-22 MED ORDER — MIDAZOLAM HCL 5 MG/5ML IJ SOLN
INTRAMUSCULAR | Status: DC | PRN
  Administered 2020-03-22: 2 mg via INTRAVENOUS

## 2020-03-22 MED ORDER — ONDANSETRON HCL 4 MG/2ML IJ SOLN
INTRAMUSCULAR | Status: DC | PRN
  Administered 2020-03-22: 4 mg via INTRAVENOUS

## 2020-03-22 SURGICAL SUPPLY — 38 items
BNDG ELASTIC 4X5.8 VLCR STR LF (GAUZE/BANDAGES/DRESSINGS) ×3 IMPLANT
BNDG ELASTIC 6X5.8 VLCR STR LF (GAUZE/BANDAGES/DRESSINGS) ×1 IMPLANT
BNDG GAUZE ELAST 4 BULKY (GAUZE/BANDAGES/DRESSINGS) ×4 IMPLANT
BRUSH SCRUB EZ PLAIN DRY (MISCELLANEOUS) ×2 IMPLANT
CHLORAPREP W/TINT 26 (MISCELLANEOUS) ×3 IMPLANT
COVER SURGICAL LIGHT HANDLE (MISCELLANEOUS) ×4 IMPLANT
COVER WAND RF STERILE (DRAPES) ×1 IMPLANT
DRAPE C-ARM 42X72 X-RAY (DRAPES) ×2 IMPLANT
DRAPE C-ARMOR (DRAPES) IMPLANT
DRAPE U-SHAPE 47X51 STRL (DRAPES) ×3 IMPLANT
DRSG ADAPTIC 3X8 NADH LF (GAUZE/BANDAGES/DRESSINGS) ×1 IMPLANT
ELECT REM PT RETURN 9FT ADLT (ELECTROSURGICAL) ×3
ELECTRODE REM PT RTRN 9FT ADLT (ELECTROSURGICAL) ×1 IMPLANT
GAUZE SPONGE 4X4 12PLY STRL (GAUZE/BANDAGES/DRESSINGS) ×3 IMPLANT
GLOVE BIO SURGEON STRL SZ 6.5 (GLOVE) ×5 IMPLANT
GLOVE BIO SURGEON STRL SZ7.5 (GLOVE) ×8 IMPLANT
GLOVE BIO SURGEONS STRL SZ 6.5 (GLOVE) ×2
GLOVE BIOGEL PI IND STRL 6.5 (GLOVE) ×1 IMPLANT
GLOVE BIOGEL PI IND STRL 7.5 (GLOVE) ×1 IMPLANT
GLOVE BIOGEL PI INDICATOR 6.5 (GLOVE) ×2
GLOVE BIOGEL PI INDICATOR 7.5 (GLOVE) ×2
GOWN STRL REUS W/ TWL LRG LVL3 (GOWN DISPOSABLE) ×2 IMPLANT
GOWN STRL REUS W/TWL LRG LVL3 (GOWN DISPOSABLE) ×9
KIT BASIN OR (CUSTOM PROCEDURE TRAY) ×3 IMPLANT
KIT TURNOVER KIT B (KITS) ×3 IMPLANT
MANIFOLD NEPTUNE II (INSTRUMENTS) ×3 IMPLANT
NS IRRIG 1000ML POUR BTL (IV SOLUTION) ×1 IMPLANT
PACK TOTAL JOINT (CUSTOM PROCEDURE TRAY) ×3 IMPLANT
PAD ARMBOARD 7.5X6 YLW CONV (MISCELLANEOUS) ×4 IMPLANT
PADDING CAST COTTON 6X4 STRL (CAST SUPPLIES) ×3 IMPLANT
SPONGE LAP 18X18 RF (DISPOSABLE) ×1 IMPLANT
STAPLER VISISTAT 35W (STAPLE) IMPLANT
SUT MNCRL AB 3-0 PS2 18 (SUTURE) ×1 IMPLANT
SUT MON AB 2-0 CT1 36 (SUTURE) ×1 IMPLANT
TOWEL GREEN STERILE (TOWEL DISPOSABLE) ×2 IMPLANT
TOWEL GREEN STERILE FF (TOWEL DISPOSABLE) ×4 IMPLANT
UNDERPAD 30X36 HEAVY ABSORB (UNDERPADS AND DIAPERS) ×3 IMPLANT
WATER STERILE IRR 1000ML POUR (IV SOLUTION) ×2 IMPLANT

## 2020-03-22 NOTE — H&P (Signed)
Ortho H&P  Patient presents for external fixator removal. Please see previous H&P for full details regarding his course. He agrees to proceed with surgery after full discussion or risks and benefits.  Roby Lofts, MD [facility] [phone] (office) [email or web address]

## 2020-03-22 NOTE — Anesthesia Procedure Notes (Signed)
Procedure Name: LMA Insertion Date/Time: 03/22/2020 7:39 AM Performed by: Margarita Rana, CRNA Pre-anesthesia Checklist: Patient identified, Emergency Drugs available, Suction available, Timeout performed and Patient being monitored Patient Re-evaluated:Patient Re-evaluated prior to induction Oxygen Delivery Method: Circle system utilized Preoxygenation: Pre-oxygenation with 100% oxygen Induction Type: IV induction Ventilation: Mask ventilation without difficulty LMA: LMA inserted LMA Size: 5.0 Number of attempts: 1 Tube secured with: Tape Dental Injury: Teeth and Oropharynx as per pre-operative assessment

## 2020-03-22 NOTE — Discharge Instructions (Addendum)
[facility] Discharge Instructions   General Discharge Instructions  WEIGHT BEARING STATUS: Non-weightbearing right arm  RANGE OF MOTION/ACTIVITY: Keep hinge brace on at all times. Okay for range of motion of the elbow in the brace from 30-90 degrees  Wound Care: Surgical dressing may be removed on post-op day #2 (03/24/2020) Incisions can be left open to air if there is no drainage. If incision continues to have drainage, follow wound care instructions below. Okay to shower if no drainage from incisions.  DVT/PE prophylaxis: None  Diet: as you were eating previously.  Can use over the counter stool softeners and bowel preparations, such as Miralax, to help with bowel movements.  Narcotics can be constipating.  Be sure to drink plenty of fluids  PAIN MEDICATION USE AND EXPECTATIONS  You have likely been given narcotic medications to help control your pain.  After a traumatic event that results in an fracture (broken bone) with or without surgery, it is ok to use narcotic pain medications to help control one's pain.  We understand that everyone responds to pain differently and each individual patient will be evaluated on a regular basis for the continued need for narcotic medications. Ideally, narcotic medication use should last no more than 6-8 weeks (coinciding with fracture healing).   As a patient it is your responsibility as well to monitor narcotic medication use and report the amount and frequency you use these medications when you come to your office visit.   We would also advise that if you are using narcotic medications, you should take a dose prior to therapy to maximize you participation.  IF YOU ARE ON NARCOTIC MEDICATIONS IT IS NOT PERMISSIBLE TO OPERATE A MOTOR VEHICLE (MOTORCYCLE/CAR/TRUCK/MOPED) OR HEAVY MACHINERY DO NOT MIX NARCOTICS WITH OTHER CNS (CENTRAL NERVOUS SYSTEM) DEPRESSANTS SUCH AS ALCOHOL   STOP SMOKING OR USING NICOTINE PRODUCTS!!!!  As  discussed nicotine severely impairs your body's ability to heal surgical and traumatic wounds but also impairs bone healing.  Wounds and bone heal by forming microscopic blood vessels (angiogenesis) and nicotine is a vasoconstrictor (essentially, shrinks blood vessels).  Therefore, if vasoconstriction occurs to these microscopic blood vessels they essentially disappear and are unable to deliver necessary nutrients to the healing tissue.  This is one modifiable factor that you can do to dramatically increase your chances of healing your injury.    (This means no smoking, no nicotine gum, patches, etc)  DO NOT USE NONSTEROIDAL ANTI-INFLAMMATORY DRUGS (NSAID'S)  Using products such as Advil (ibuprofen), Aleve (naproxen), Motrin (ibuprofen) for additional pain control during fracture healing can delay and/or prevent the healing response.  If you would like to take over the counter (OTC) medication, Tylenol (acetaminophen) is ok.  However, some narcotic medications that are given for pain control contain acetaminophen as well. Therefore, you should not exceed more than 4000 mg of tylenol in a day if you do not have liver disease.  Also note that there are may OTC medicines, such as cold medicines and allergy medicines that my contain tylenol as well.  If you have any questions about medications and/or interactions please ask your doctor/PA or your pharmacist.      ICE AND ELEVATE INJURED/OPERATIVE EXTREMITY  Using ice and elevating the injured extremity above your heart can help with swelling and pain control.  Icing in a pulsatile fashion, such as 20 minutes on and 20 minutes off, can be followed.    Do not place ice directly on skin. Make sure there is  a barrier between to skin and the ice pack.    Using frozen items such as frozen peas works well as the conform nicely to the are that needs to be iced.  USE AN ACE WRAP OR TED HOSE FOR SWELLING CONTROL  In addition to icing and elevation, Ace wraps or TED  hose are used to help limit and resolve swelling.  It is recommended to use Ace wraps or TED hose until you are informed to stop.    When using Ace Wraps start the wrapping distally (farthest away from the body) and wrap proximally (closer to the body)   Example: If you had surgery on your leg or thing and you do not have a splint on, start the ace wrap at the toes and work your way up to the thigh        If you had surgery on your upper extremity and do not have a splint on, start the ace wrap at your fingers and work your way up to the upper arm  IF YOU ARE IN A SPLINT OR CAST DO NOT REMOVE IT FOR ANY REASON   If your splint gets wet for any reason please contact the office immediately. You may shower in your splint or cast as long as you keep it dry.  This can be done by wrapping in a cast cover or garbage back (or similar)  Do Not stick any thing down your splint or cast such as pencils, money, or hangers to try and scratch yourself with.  If you feel itchy take benadryl as prescribed on the bottle for itching    CALL THE OFFICE WITH ANY QUESTIONS OR CONCERNS: [phone]   VISIT OUR WEBSITE FOR ADDITIONAL INFORMATION: [email or web address]     Discharge Wound Care Instructions  Do NOT apply any ointments, solutions or lotions to pin sites or surgical wounds.  These prevent needed drainage and even though solutions like hydrogen peroxide kill bacteria, they also damage cells lining the pin sites that help fight infection.  Applying lotions or ointments can keep the wounds moist and can cause them to breakdown and open up as well. This can increase the risk for infection. When in doubt call the office.  Surgical incisions should be dressed daily.  If any drainage is noted, use one layer of adaptic, then gauze, Kerlix, and an ace wrap.  Once the incision is completely dry and without drainage, it may be left open to air out.  Showering may begin 36-48 hours later.  Cleaning gently with soap  and water.  Traumatic wounds should be dressed daily as well.    One layer of adaptic, gauze, Kerlix, then ace wrap.  The adaptic can be discontinued once the draining has ceased    If you have a wet to dry dressing: wet the gauze with saline the squeeze as much saline out so the gauze is moist (not soaking wet), place moistened gauze over wound, then place a dry gauze over the moist one, followed by Kerlix wrap, then ace wrap.

## 2020-03-22 NOTE — Op Note (Signed)
Orthopaedic Surgery Operative Note (CSN: [id number] ) Date of Surgery: 03/22/2020  Admit Date: 03/22/2020   Diagnoses: Pre-Op Diagnoses: Complex right proximal ulna fracture/dislocation  Post-Op Diagnosis: Same  Procedures: 1. CPT 20694-Removal of external fixator right elbow 2. CPT 11044-Debridement of external fixator pin sites  Surgeons : Primary: Paz Fuentes, Gillie Manners, MD  Assistant: None  Location: OR 3   Anesthesia:General  Antibiotics: None   Tourniquet time:None    Estimated Blood Loss:Minimal  Complications:None   Specimens:None   Implants: * No implants in log *   Indications for Surgery: 39 year old male who sustained a complex of right proximal ulna fracture dislocation that underwent open repair.  This unfortunately failed and he required subsequent revision fixation and repair of his elbow.  He was placed in a static external fixator for 4 weeks.  He had repeated radiographs that show stable appearance and concentric radiocapitellar and ulnohumeral joint.  I felt that he was indicated for removal of external fixation with placement of hinged elbow brace.  Risks and benefits were discussed with the patient and his wife.  Risks include but not limited to bleeding, infection, malunion, elbow stiffness, elbow instability, need for revision surgery, even the possibility of anesthetic complications.  They agreed to proceed with surgery and consent was obtained.  Operative Findings: Removal of external fixation from right elbow with a debridement of external fixation pin sites.  Stress under fluoroscopy showed a concentric ulnohumeral joint without any signs of subluxation or dislocation.  Procedure: The patient was identified in the preoperative holding area. Consent was confirmed with the patient and their family and all questions were answered. The operative extremity was marked after confirmation with the patient. he was then brought back to the operating room by our  anesthesia colleagues.  He was placed under general anesthetic and carefully transferred over to a radiolucent flat top table.  His external fixator was loosened and fluoroscopic imaging was obtained.  It showed that his elbow was stable without any signs of subluxation or dislocation.  I was able to range him from approximately 20 degrees short of full extension to approximately 100 degrees of flexion with some limitations in range of motion secondary to stiffness and a body habitus.  Once I determined that the elbow was stable I removed the pins and debrided the pin sites and pin tracts with a curette.  I irrigated the pin sites and placed a sterile dressing consisting of 4 x 4s, Kerlix and Ace wrap.  The patient was then placed in a hinged elbow brace that was restricted from 30 degrees to 90 degrees of flexion.  He was awoken from anesthesia and taken to the PACU in stable condition.  Post Op Plan/Instructions: The patient will be nonweightbearing to the right upper extremity.  No DVT prophylaxis is needed.  He will return in 2 weeks for repeat x-rays.  He should remain in the hinged elbow brace at all times.  If x-rays show stable appearance to his joint will likely start aggressive physical therapy.  I was present and performed the entire surgery.  Truitt Merle, MD [facility]

## 2020-03-22 NOTE — Anesthesia Postprocedure Evaluation (Signed)
Anesthesia Post Note  Patient: Joseph [name]  Procedure(s) Performed: REMOVAL EXTERNAL FIXATION ARM (Right )     Patient location during evaluation: PACU Anesthesia Type: General Level of consciousness: awake and alert and oriented Pain management: pain level controlled Vital Signs Assessment: post-procedure vital signs reviewed and stable Respiratory status: spontaneous breathing, nonlabored ventilation and respiratory function stable Cardiovascular status: blood pressure returned to baseline Postop Assessment: no apparent nausea or vomiting Anesthetic complications: no    Last Vitals:  Vitals:   03/22/20 0830 03/22/20 0839  BP: (!) 130/97 (!) 126/98  Pulse: 87   Resp: 11 12  Temp:  (!) 36.1 C  SpO2: 93%     Last Pain:  Vitals:   03/22/20 0830  TempSrc:   PainSc: 0-No pain                 Kaylyn Layer

## 2020-03-22 NOTE — Transfer of Care (Signed)
Immediate Anesthesia Transfer of Care Note  Patient: Joseph [name]  Procedure(s) Performed: REMOVAL EXTERNAL FIXATION ARM (Right )  Patient Location: PACU  Anesthesia Type:General  Level of Consciousness: awake, alert , oriented, patient cooperative and responds to stimulation  Airway & Oxygen Therapy: Patient Spontanous Breathing and Patient connected to face mask oxygen  Post-op Assessment: Report given to RN, Post -op Vital signs reviewed and stable and Patient moving all extremities X 4  Post vital signs: Reviewed and stable  Last Vitals:  Vitals Value Taken Time  BP 138/82 03/22/20 0816  Temp    Pulse 97 03/22/20 0817  Resp 13 03/22/20 0817  SpO2 100 % 03/22/20 0817  Vitals shown include unvalidated device data.  Last Pain:  Vitals:   03/22/20 0605  TempSrc: Oral  PainSc: 0-No pain         Complications: No apparent anesthesia complications

## 2020-04-05 NOTE — H&P (Signed)
This is a H&P for external fixation removal since Epic documentation will not accept previous H&P  [facility] ([facility]) H&P  Patient ID: Joseph [name] MRN: [id number] DOB/AGE: 1981/06/10 39 y.o.  Reason for Surgery: Right elbow fracture/dislocation  HPI: Joseph [name] is an 39 y.o. male who presents for removal of external fixation. Underwent revision ORIF with ex fix placement on 4/9. Presents for ex fix removal  Past Medical History:  Diagnosis Date  . Acute sinus infection 11/07/2014  . Arthritis   . Blood transfusion without reported diagnosis    At [facility]   . Difficult intubation 01/13/2020   difficult airway in OR (per Dr Laurey Arrow)  . Iron deficiency anemia 09/21/2014  . Psoriasis   . Psoriatic arthritis ([place])   . Psoriatic arthritis ([place])   . 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 Percival Spanish 02/2009 with echo, as needed f/u    Past Surgical History:  Procedure Laterality Date  . COLONOSCOPY WITH ESOPHAGOGASTRODUODENOSCOPY (EGD)    . EXTERNAL FIXATION REMOVAL Right 03/22/2020   Procedure: REMOVAL EXTERNAL FIXATION ARM;  Surgeon: Shona Needles, MD;  Location: [place];  Service: Orthopedics;  Laterality: Right;  . HERNIA REPAIR  1994   umbicial  . 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 LIGAMENT REPAIR Right 01/13/2020   Procedure: COLLATERAL LIGAMENT REPAIRS;  Surgeon: Milly Jakob, MD;  Location: [place];  Service: Orthopedics;  Laterality: Right;  . ORIF ELBOW FRACTURE Right 02/17/2020   Procedure: REVISION OPEN REDUCTION INTERNAL FIXATION (ORIF) ELBOW/OLECRANON FRACTURE;  Surgeon: Shona Needles, MD;  Location: [place];  Service: Orthopedics;  Laterality: Right;  . ORIF ULNAR FRACTURE Right 01/13/2020   Procedure: OPEN TREATMENT OF COMPLEX RIGHT PROXIMAL ULNA FRACTURE;  Surgeon: Milly Jakob, MD;  Location: [place];  Service: Orthopedics;  Laterality: Right;  PROCEDURE: OPEN TREATMENT OF COMPLEX RIGHT PROXIMAL ULNA FRACTURE WITH POSSIBLE COLLATERAL LIGAMENT REPAIRS LENGTH OF SURGERY: 2.5 HOURSMAC + REGIONAL BLOCK    Family History  Problem Relation Age of Onset  . Pulmonary fibrosis Mother   . Hypertension Mother   . Pancreatic cancer Maternal Grandmother   . Rheum arthritis Paternal Aunt   . Cancer Paternal Grandmother   . Breast cancer Paternal Grandmother   . COPD Paternal Grandfather   . CAD Paternal Grandfather   . Colon cancer Neg Hx   . Prostate cancer Neg Hx   . Diabetes Neg Hx   . Thyroid disease Neg Hx     Social History:  reports that he has never smoked. He has never used smokeless tobacco. He reports that he does not drink alcohol or use drugs.  Allergies:  Allergies  Allergen Reactions  . Sulfonamide Derivatives Other (See Comments)    UNSPECIFIED  . Bee Venom Palpitations    unknown    Medications:  No current facility-administered medications on file prior to encounter.   Current Outpatient Medications on File Prior to Encounter  Medication Sig Dispense Refill  . acetaminophen (TYLENOL) 325 MG tablet Take 2 tablets (650 mg total) by mouth every 6 (six) hours. (Patient taking differently: Take 650 mg by mouth every 6 (six) hours as needed for mild pain or moderate pain. )    . clobetasol cream (TEMOVATE) 0.05 % Apply topically 2 (two) times daily as needed. (Patient  taking differently: Apply 1 application topically 2 (two) times daily as needed (irritation). ) 30 g 1  . folic acid (FOLVITE) 1 MG tablet Take 1 tablet (1 mg total) by mouth daily. 90 tablet 3  . Secukinumab (COSENTYX SENSOREADY PEN) 150 MG/ML SOAJ Inject 150 mg into the skin every 14 (fourteen) days. --hold for 2 weeks following surgery 6 pen 0    ROS: Constitutional: No fever or chills Vision: No changes in vision ENT: No difficulty swallowing CV: No chest  pain Pulm: No SOB or wheezing GI: No nausea or vomiting GU: No urgency or inability to hold urine Skin: No poor wound healing Neurologic: No numbness or tingling Psychiatric: No depression or anxiety Heme: No bruising Allergic: No reaction to medications or food   Exam: Blood pressure (!) 126/98, pulse 87, temperature (!) 97 F (36.1 C), resp. rate 12, height 5\' 10"  (1.778 m), weight 133.8 kg, SpO2 93 %. General: No acute distress Orientation: Awake alert and oriented x3 Mood and Affect: Cooperative and pleasant Gait: Within normal limits Coordination and balance: Within normal limits  Right upper extremity: Ex fix in placed, incision healed neurovascularly intact  Left upper extremity: Skin without lesions. No tenderness to palpation. Full painless ROM, full strength in each muscle groups without evidence of instability.   Medical Decision Making: Data: Imaging: stable  Labs: No results found for this or any previous visit (from the past 24 hour(s)).  Imaging or Labs ordered: CT scan of right elbow  Medical history and chart was reviewed and case discussed with medical provider.  Assessment/Plan: 39 year old male with failed fixation of right elbow fracture dislocation s/p revision fixation and ex fix   Plan to proceed with ex fix removal. Risks and benefits discussed and he agrees to proceed.  31, MD [facility] [phone] (office) [email or web address]

## 2020-05-01 ENCOUNTER — Telehealth: Payer: Self-pay | Admitting: Rheumatology

## 2020-05-01 NOTE — Telephone Encounter (Signed)
Shanda Bumps called stating she was returning Rachael's call regarding [facility] Cosentyx.  Shanda Bumps states she works 8-5 pm and is requesting your direct number so she can return your call.

## 2020-05-02 NOTE — Telephone Encounter (Signed)
Returned call and left message. Pharmacy states patient's last Cosentyx refill was 01/27/2020. Advised wife to contact office to advise when patient's last injection was and if he is still holding due to surgery. Advised that clinical staff will advise if patient will need to come to office to restart medication.

## 2020-05-04 ENCOUNTER — Telehealth: Payer: Self-pay | Admitting: *Deleted

## 2020-05-04 NOTE — Telephone Encounter (Signed)
Patient has been off therapy greater than 3 months.  He will need to schedule an appointment to resume Cosentyx in office to monitor for allergic reaction.  Does patient need office visit or just nurse visit to resume Cosentyx?

## 2020-05-04 NOTE — Telephone Encounter (Signed)
Patient has new insurance,patient has been off Cosentyxsince March due to surgery, patient needs apply for Cosentyx on new insurance. [phone]

## 2020-05-04 NOTE — Telephone Encounter (Signed)
Attempted to contact the patient and left message for patient to call the office. Per [employer], Clinical pharmacist, patient will need CBC/CMP prior to scheduling nurse visit. Will schedule nurse visit to restart Cosentyx after labs have resulted.

## 2020-05-04 NOTE — Telephone Encounter (Signed)
It is okay to schedule a nurse visit to restart Cosentyx.  He has not a scheduled appointment in August for a follow-up visit.

## 2020-05-04 NOTE — Telephone Encounter (Signed)
See previous office note for further details.

## 2020-05-04 NOTE — Telephone Encounter (Signed)
Submitted a Prior Authorization request to [facility] for COSENTYX via Cover My Meds. Will update once we receive a response.   (Key: [id number])

## 2020-05-07 NOTE — Telephone Encounter (Signed)
Attempted to contact the patient and left message to advise patient we will need labs prior to scheduling him for a restart visit for Cosentyx.

## 2020-05-08 NOTE — Telephone Encounter (Signed)
Patient's wife advised patient will need CBC/CMP prior to scheduling nurse visit. Patient's wife states she will have him come to the office on 05/09/2020 to have labs performed.

## 2020-05-09 ENCOUNTER — Other Ambulatory Visit: Payer: Self-pay

## 2020-05-09 DIAGNOSIS — Z79899 Other long term (current) drug therapy: Secondary | ICD-10-CM

## 2020-05-10 ENCOUNTER — Telehealth: Payer: Self-pay | Admitting: Rheumatology

## 2020-05-10 LAB — CBC WITH DIFFERENTIAL/PLATELET
Absolute Monocytes: 382 cells/uL (ref 200–950)
Basophils Absolute: 43 cells/uL (ref 0–200)
Basophils Relative: 0.6 %
Eosinophils Absolute: 173 cells/uL (ref 15–500)
Eosinophils Relative: 2.4 %
HCT: 42 % (ref 38.5–50.0)
Hemoglobin: 13 g/dL — ABNORMAL LOW (ref 13.2–17.1)
Lymphs Abs: 1814 cells/uL (ref 850–3900)
MCH: 24.4 pg — ABNORMAL LOW (ref 27.0–33.0)
MCHC: 31 g/dL — ABNORMAL LOW (ref 32.0–36.0)
MCV: 78.8 fL — ABNORMAL LOW (ref 80.0–100.0)
MPV: 11 fL (ref 7.5–12.5)
Monocytes Relative: 5.3 %
Neutro Abs: 4788 cells/uL (ref 1500–7800)
Neutrophils Relative %: 66.5 %
Platelets: 216 10*3/uL (ref 140–400)
RBC: 5.33 10*6/uL (ref 4.20–5.80)
RDW: 14.9 % (ref 11.0–15.0)
Total Lymphocyte: 25.2 %
WBC: 7.2 10*3/uL (ref 3.8–10.8)

## 2020-05-10 LAB — COMPLETE METABOLIC PANEL WITH GFR
AG Ratio: 1.1 (calc) (ref 1.0–2.5)
ALT: 20 U/L (ref 9–46)
AST: 20 U/L (ref 10–40)
Albumin: 3.9 g/dL (ref 3.6–5.1)
Alkaline phosphatase (APISO): 117 U/L (ref 36–130)
BUN: 11 mg/dL (ref 7–25)
CO2: 28 mmol/L (ref 20–32)
Calcium: 9.1 mg/dL (ref 8.6–10.3)
Chloride: 102 mmol/L (ref 98–110)
Creat: 0.92 mg/dL (ref 0.60–1.35)
GFR, Est African American: 123 mL/min/{1.73_m2} (ref >=60)
GFR, Est Non African American: 106 mL/min/{1.73_m2} (ref >=60)
Globulin: 3.6 g/dL (calc) (ref 1.9–3.7)
Glucose, Bld: 111 mg/dL — ABNORMAL HIGH (ref 65–99)
Potassium: 3.8 mmol/L (ref 3.5–5.3)
Sodium: 137 mmol/L (ref 135–146)
Total Bilirubin: 0.6 mg/dL (ref 0.2–1.2)
Total Protein: 7.5 g/dL (ref 6.1–8.1)

## 2020-05-10 NOTE — Telephone Encounter (Signed)
Patient called stating he was returning your call.   

## 2020-05-10 NOTE — Telephone Encounter (Signed)
Contacted patient and reviewed lab results. Patient has been scheduled for a nurse visit to restart Cosentyx.

## 2020-05-10 NOTE — Progress Notes (Signed)
Glucose is 111.  Rest of CMP WNL.  Hemoglobin is borderline low but stable. We will continue to monitor lab work.

## 2020-05-15 ENCOUNTER — Ambulatory Visit: Payer: 59

## 2020-05-15 ENCOUNTER — Ambulatory Visit (INDEPENDENT_AMBULATORY_CARE_PROVIDER_SITE_OTHER): Payer: 59 | Admitting: *Deleted

## 2020-05-15 ENCOUNTER — Other Ambulatory Visit: Payer: Self-pay

## 2020-05-15 VITALS — BP 137/86 | HR 74

## 2020-05-15 DIAGNOSIS — L4050 Arthropathic psoriasis, unspecified: Secondary | ICD-10-CM | POA: Diagnosis not present

## 2020-05-15 MED ORDER — SECUKINUMAB (300 MG DOSE) 150 MG/ML ~~LOC~~ SOAJ
300.0000 mg | Freq: Once | SUBCUTANEOUS | Status: AC
  Administered 2020-05-15: 300 mg via SUBCUTANEOUS

## 2020-05-15 NOTE — Patient Instructions (Addendum)
START Cosentyx 150 mg (1 pen) Every 14 days on 06/12/2020.  Standing Labs We placed an order today for your standing lab work.   Please have your standing labs drawn in 1 month.   If possible, please have your labs drawn 2 weeks prior to your appointment so that the provider can discuss your results at your appointment.  We have open lab daily Monday through Thursday from 8:30-12:30 PM and 1:30-4:30 PM and Friday from 8:30-12:30 PM and 1:30-4:00 PM at the office of Dr. Pollyann Savoy, [facility].   Please be advised, patients with office appointments requiring lab work will take precedents over walk-in lab work.  If possible, please come for your lab work on Monday and Friday afternoons, as you may experience shorter wait times. The office is located at [address], [place], [place] [address] No appointment is necessary.   Labs are drawn by [facility]. Please bring your co-pay at the time of your lab draw.  You may receive a bill from [facility] for your lab work.  If you wish to have your labs drawn at another location, please call the office 24 hours in advance to send orders.  If you have any questions regarding directions or hours of operation,  please call [phone].   As a reminder, please drink plenty of water prior to coming for your lab work. Thanks!

## 2020-05-15 NOTE — Progress Notes (Signed)
Pharmacy Note  Subjective:   Patient presents to clinic today to restart Cosentyx. Patient running a fever or have signs/symptoms of infection? No  Patient currently on antibiotics for the treatment of infection? No  Patient have any upcoming invasive procedures/surgeries? No  Objective: CMP     Component Value Date/Time   NA 137 05/09/2020 0907   NA 139 03/11/2019 1354   NA 138 10/25/2014 1011   K 3.8 05/09/2020 0907   K 3.2 (L) 10/25/2014 1011   CL 102 05/09/2020 0907   CO2 28 05/09/2020 0907   CO2 24 10/25/2014 1011   GLUCOSE 111 (H) 05/09/2020 0907   GLUCOSE 103 10/25/2014 1011   BUN 11 05/09/2020 0907   BUN 10 03/11/2019 1354   BUN 13.4 10/25/2014 1011   CREATININE 0.92 05/09/2020 0907   CREATININE 1.0 10/25/2014 1011   CALCIUM 9.1 05/09/2020 0907   CALCIUM 8.5 10/25/2014 1011   PROT 7.5 05/09/2020 0907   PROT 7.1 03/11/2019 1354   PROT 7.0 10/25/2014 1011   ALBUMIN 3.8 (L) 03/11/2019 1354   ALBUMIN 3.7 10/25/2014 1011   AST 20 05/09/2020 0907   AST 18 10/25/2014 1011   ALT 20 05/09/2020 0907   ALT 16 10/25/2014 1011   ALKPHOS 104 03/11/2019 1354   ALKPHOS 83 10/25/2014 1011   BILITOT 0.6 05/09/2020 0907   BILITOT 0.4 03/11/2019 1354   BILITOT 0.43 10/25/2014 1011   GFRNONAA 106 05/09/2020 0907   GFRAA 123 05/09/2020 0907    CBC    Component Value Date/Time   WBC 7.2 05/09/2020 0907   RBC 5.33 05/09/2020 0907   HGB 13.0 (L) 05/09/2020 0907   HGB 11.8 (L) 03/11/2019 1354   HGB 14.0 02/02/2015 0843   HCT 42.0 05/09/2020 0907   HCT 38.5 03/11/2019 1354   HCT 43.0 02/02/2015 0843   PLT 216 05/09/2020 0907   PLT 202 03/11/2019 1354   MCV 78.8 (L) 05/09/2020 0907   MCV 79 03/11/2019 1354   MCV 84.3 02/02/2015 0843   MCH 24.4 (L) 05/09/2020 0907   MCHC 31.0 (L) 05/09/2020 0907   RDW 14.9 05/09/2020 0907   RDW 17.2 (H) 03/11/2019 1354   RDW 18.7 (H) 02/02/2015 0843   LYMPHSABS 1,814 05/09/2020 0907   LYMPHSABS 2.5 03/11/2019 1354   LYMPHSABS 2.1  02/02/2015 0843   MONOABS 0.8 04/03/2017 1035   MONOABS 0.7 02/02/2015 0843   EOSABS 173 05/09/2020 0907   EOSABS 0.2 03/11/2019 1354   BASOSABS 43 05/09/2020 0907   BASOSABS 0.1 03/11/2019 1354   BASOSABS 0.0 02/02/2015 0843    Baseline Immunosuppressant Therapy Labs TB GOLD Quantiferon TB Gold Latest Ref Rng & Units 12/16/2019  Quantiferon TB Gold Plus NEGATIVE NEGATIVE   Hepatitis Panel   HIV Lab Results  Component Value Date   HIV NON-REACTIVE 07/23/2018   Immunoglobulins Immunoglobulin Electrophoresis Latest Ref Rng & Units 07/23/2018  IgA  47 - 310 mg/dL 614(E)  IgG 315 - 4,008 mg/dL 6,761(P)  IgM 50 - 509 mg/dL 326   SPEP Serum Protein Electrophoresis Latest Ref Rng & Units 05/09/2020  Total Protein 6.1 - 8.1 g/dL 7.5  Albumin 3.8 - 4.8 g/dL -  Alpha-1 0.2 - 0.3 g/dL -  Alpha-2 0.5 - 0.9 g/dL -  Beta Globulin 0.4 - 0.6 g/dL -  Beta 2 0.2 - 0.5 g/dL -  Gamma Globulin 0.8 - 1.7 g/dL -   Z1IW No results found for: G6PDH TPMT No results found for: TPMT   Chest x-ray: 02/17/2020  Assessment/Plan:   Patient tolerated injection  well. Patient monitored in office for 30 minutes for adverse reactions. No adverse reactions noted  Administrations This Visit    Secukinumab (300 MG Dose) SOAJ 300 mg    Admin Date 05/15/2020 Action Given Dose 300 mg Route Subcutaneous Administered By Henriette Combs, LPN           Patient due for labs in 1 month.  Patient is to call and reschedule appointment if running a fever with signs/symptoms of infection, on antibiotics for active infection or has an upcoming invasive procedure.  All questions encouraged and answered.  Instructed patient to call with any further questions or concerns.

## 2020-05-21 MED ORDER — COSENTYX SENSOREADY PEN 150 MG/ML ~~LOC~~ SOAJ: 150.0000 mg | SUBCUTANEOUS | 0 refills | Status: DC

## 2020-05-21 NOTE — Addendum Note (Signed)
Addended by: Verlin Fester C on: 05/21/2020 10:16 AM   Modules accepted: Orders

## 2020-05-21 NOTE — Telephone Encounter (Signed)
Received response from [employer] that patient was not covered. Ran new eligibility check and found

## 2020-05-21 NOTE — Telephone Encounter (Signed)
Prescription sent to [facility] specialty pharmacy.  Nothing further needed.  Verlin Fester, PharmD, [place], CPP Clinical Specialty Pharmacist (Rheumatology and Pulmonology)  05/21/2020 10:15 AM

## 2020-05-21 NOTE — Telephone Encounter (Signed)
Received notification from [facility] regarding a prior authorization for COSENTYX. Authorization has been APPROVED from 05/21/20 to 05/21/21.   Authorization # [id number]   Per plan, patient must fill through [facility]. Please send in rx for patient.

## 2020-05-21 NOTE — Telephone Encounter (Signed)
Patient has Publishing copy. Submitted an expeditied Prior Authorization request to [facility] for COSENTYX via Cover My Meds. Will update once we receive a response.   ([employer]Julius Bowels) - [id number]

## 2020-05-29 ENCOUNTER — Encounter: Payer: Self-pay | Admitting: Family Medicine

## 2020-06-11 ENCOUNTER — Telehealth: Payer: Self-pay | Admitting: Rheumatology

## 2020-06-11 DIAGNOSIS — L4050 Arthropathic psoriasis, unspecified: Secondary | ICD-10-CM

## 2020-06-11 DIAGNOSIS — L409 Psoriasis, unspecified: Secondary | ICD-10-CM

## 2020-06-11 NOTE — Telephone Encounter (Signed)
Patient received a letter stating Consentyx has been approved. Patient is due for next dose today. How are they to proceed from here? Please call patient after 2:30 pm to discuss.

## 2020-06-12 MED ORDER — COSENTYX SENSOREADY PEN 150 MG/ML ~~LOC~~ SOAJ: 150.0000 mg | SUBCUTANEOUS | 0 refills | Status: DC

## 2020-06-12 NOTE — Telephone Encounter (Signed)
Does not look like patient's RX got sent to [facility]. Patient will need to come for a sample while rx processes.  Cosentyx copay card info- [id number], ID- [id number], [id number], Group- [id number]

## 2020-06-12 NOTE — Telephone Encounter (Signed)
Prescription sent to the pharmacy along with co-pay card information.  Patient may stop by the office for a sample.  [facility] should reach out to set up shipment in the next day or so.  Patient can also call [phone] to set up shipment.  Co-pay card information given to the pharmacy.   Verlin Fester, PharmD, [place], CPP Clinical Specialty Pharmacist (Rheumatology and Pulmonology)  06/12/2020 11:09 AM

## 2020-06-20 ENCOUNTER — Telehealth: Payer: Self-pay | Admitting: Rheumatology

## 2020-06-20 NOTE — Telephone Encounter (Signed)
Called [facility], they states they can not see copay card info in the pharmacy note. Verbally provided copay card. They will reach out to patient today to finish scheduling shipment.  Called patient, left message advising.

## 2020-06-20 NOTE — Telephone Encounter (Signed)
Patient states he just got off the phone with the specialty pharmacy and they don't have a co-pay card on file for his prescription of Cosentyx.  Patient is requesting a return call.

## 2020-06-25 NOTE — Progress Notes (Deleted)
Office Visit Note  Patient: Joseph [name]             Date of Birth: 1980-12-23           MRN: [id number]             PCP: Eunice Blase, MD Referring: Eunice Blase, MD Visit Date: 07/06/2020 Occupation: @GUAROCC @  Subjective:  No chief complaint on file.   History of Present Illness: Joseph [name] is a 39 y.o. male ***   Activities of Daily Living:  Patient reports morning stiffness for *** {minute/hour:19697}.   Patient {ACTIONS;DENIES/REPORTS:21021675::"Denies"} nocturnal pain.  Difficulty dressing/grooming: {ACTIONS;DENIES/REPORTS:21021675::"Denies"} Difficulty climbing stairs: {ACTIONS;DENIES/REPORTS:21021675::"Denies"} Difficulty getting out of chair: {ACTIONS;DENIES/REPORTS:21021675::"Denies"} Difficulty using hands for taps, buttons, cutlery, and/or writing: {ACTIONS;DENIES/REPORTS:21021675::"Denies"}  No Rheumatology ROS completed.   PMFS History:  Patient Active Problem List   Diagnosis Date Noted  . Elbow dislocation, right, initial encounter 03/06/2020  . Closed disp fx of right olecranon with intraartic exten w malunion 02/14/2020  . Morbid obesity with BMI of 45.0-49.9, adult ([place]) 11/25/2019  . Hair loss 12/31/2017  . Varicose veins of both lower extremities 07/26/2017  . Iron deficiency anemia 09/21/2014  . Psoriatic arthritis ([place]) 09/21/2014  . ESSENTIAL HYPERTENSION, MALIGNANT 02/18/2009  . Nonspecific (abnormal) findings on radiological and other examination of body structure 02/16/2009  . CHEST XRAY, ABNORMAL 02/16/2009    Past Medical History:  Diagnosis Date  . Acute sinus infection 11/07/2014  . Arthritis   . Blood transfusion without reported diagnosis    At [facility]   . Difficult intubation 01/13/2020   difficult airway in OR (per Dr Laurey Arrow)  . Iron deficiency anemia 09/21/2014  . Psoriasis   . Psoriatic arthritis ([place])   . Psoriatic arthritis ([place])   . 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 Percival Spanish 02/2009 with echo, as needed f/u    Family History  Problem Relation Age of Onset  . Pulmonary fibrosis Mother   . Hypertension Mother   . Pancreatic cancer Maternal Grandmother   . Rheum arthritis Paternal Aunt   . Cancer Paternal Grandmother   . Breast cancer Paternal Grandmother   . COPD Paternal Grandfather   . CAD Paternal Grandfather   . Colon cancer Neg Hx   . Prostate cancer Neg Hx   . Diabetes Neg Hx   . Thyroid disease Neg Hx    Past Surgical History:  Procedure Laterality Date  . COLONOSCOPY WITH ESOPHAGOGASTRODUODENOSCOPY (EGD)    . EXTERNAL FIXATION REMOVAL Right 03/22/2020   Procedure: REMOVAL EXTERNAL FIXATION ARM;  Surgeon: Shona Needles, MD;  Location: [place];  Service: Orthopedics;  Laterality: Right;  . HERNIA REPAIR  1994   umbicial  . 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 LIGAMENT REPAIR Right 01/13/2020   Procedure: COLLATERAL LIGAMENT REPAIRS;  Surgeon: Milly Jakob, MD;  Location: [place];  Service: Orthopedics;  Laterality: Right;  . ORIF ELBOW FRACTURE Right 02/17/2020   Procedure: REVISION OPEN REDUCTION INTERNAL FIXATION (ORIF) ELBOW/OLECRANON FRACTURE;  Surgeon: Shona Needles, MD;  Location: [place];  Service: Orthopedics;  Laterality: Right;  . ORIF ULNAR FRACTURE Right 01/13/2020   Procedure: OPEN TREATMENT OF COMPLEX RIGHT PROXIMAL ULNA FRACTURE;  Surgeon: Milly Jakob, MD;  Location: [place];  Service: Orthopedics;  Laterality: Right;  PROCEDURE: OPEN TREATMENT  OF COMPLEX RIGHT PROXIMAL ULNA FRACTURE WITH POSSIBLE COLLATERAL LIGAMENT REPAIRS LENGTH OF SURGERY: 2.5 [place] + REGIONAL BLOCK   Social History   Social History Narrative   Right handed   Caffeien use:1 soda per day   Immunization History  Administered Date(s) Administered  . MMR 02/12/2001  . Pneumococcal Polysaccharide-23  11/10/2012  . Td 08/17/2001     Objective: Vital Signs: There were no vitals taken for this visit.   Physical Exam   Musculoskeletal Exam: ***  CDAI Exam: CDAI Score: -- Patient Global: --; Provider Global: -- Swollen: --; Tender: -- Joint Exam 07/06/2020   No joint exam has been documented for this visit   There is currently no information documented on the homunculus. Go to the Rheumatology activity and complete the homunculus joint exam.  Investigation: No additional findings.  Imaging: No results found.  Recent Labs: Lab Results  Component Value Date   WBC 7.2 05/09/2020   HGB 13.0 (L) 05/09/2020   PLT 216 05/09/2020   NA 137 05/09/2020   K 3.8 05/09/2020   CL 102 05/09/2020   CO2 28 05/09/2020   GLUCOSE 111 (H) 05/09/2020   BUN 11 05/09/2020   CREATININE 0.92 05/09/2020   BILITOT 0.6 05/09/2020   ALKPHOS 104 03/11/2019   AST 20 05/09/2020   ALT 20 05/09/2020   PROT 7.5 05/09/2020   ALBUMIN 3.8 (L) 03/11/2019   CALCIUM 9.1 05/09/2020   GFRAA 123 05/09/2020   QFTBGOLD Negative 04/03/2017   QFTBGOLDPLUS NEGATIVE 12/16/2019    Speciality Comments: Prior therapy: Enbrel/Humira (inadequate response)  and Remicaide (d/c due to cost and time consuming infusion)  Procedures:  No procedures performed Allergies: Sulfonamide derivatives and Bee venom   Assessment / Plan:     Visit Diagnoses: No diagnosis found.  Orders: No orders of the defined types were placed in this encounter.  No orders of the defined types were placed in this encounter.   Face-to-face time spent with patient was *** minutes. Greater than 50% of time was spent in counseling and coordination of care.  Follow-Up Instructions: No follow-ups on file.   Earnestine Mealing, CMA  Note - This record has been created using Editor, commissioning.  Chart creation errors have been sought, but may not always  have been located. Such creation errors do not reflect on  the standard of medical  care.

## 2020-07-06 ENCOUNTER — Ambulatory Visit: Payer: No Typology Code available for payment source | Admitting: Physician Assistant

## 2020-07-06 DIAGNOSIS — Z79899 Other long term (current) drug therapy: Secondary | ICD-10-CM

## 2020-07-06 DIAGNOSIS — R600 Localized edema: Secondary | ICD-10-CM

## 2020-07-06 DIAGNOSIS — S52001D Unspecified fracture of upper end of right ulna, subsequent encounter for closed fracture with routine healing: Secondary | ICD-10-CM

## 2020-07-06 DIAGNOSIS — Z862 Personal history of diseases of the blood and blood-forming organs and certain disorders involving the immune mechanism: Secondary | ICD-10-CM

## 2020-07-06 DIAGNOSIS — L4050 Arthropathic psoriasis, unspecified: Secondary | ICD-10-CM

## 2020-07-06 DIAGNOSIS — Z8679 Personal history of other diseases of the circulatory system: Secondary | ICD-10-CM

## 2020-07-06 DIAGNOSIS — M19041 Primary osteoarthritis, right hand: Secondary | ICD-10-CM

## 2020-07-06 DIAGNOSIS — L409 Psoriasis, unspecified: Secondary | ICD-10-CM

## 2020-07-06 DIAGNOSIS — I8393 Asymptomatic varicose veins of bilateral lower extremities: Secondary | ICD-10-CM

## 2020-07-06 DIAGNOSIS — M67911 Unspecified disorder of synovium and tendon, right shoulder: Secondary | ICD-10-CM

## 2020-08-10 ENCOUNTER — Ambulatory Visit: Payer: 59 | Admitting: Family Medicine

## 2020-08-28 ENCOUNTER — Other Ambulatory Visit: Payer: Self-pay | Admitting: Pharmacist

## 2020-08-28 DIAGNOSIS — L4050 Arthropathic psoriasis, unspecified: Secondary | ICD-10-CM

## 2020-08-28 DIAGNOSIS — L409 Psoriasis, unspecified: Secondary | ICD-10-CM

## 2020-08-28 NOTE — Telephone Encounter (Signed)
Routing to clinical staff.

## 2020-08-28 NOTE — Telephone Encounter (Signed)
Last Visit: 02/03/2020 Next Visit: 08/31/2020 Labs: 05/09/2020 glucose is 111. Rest of CMP WNL. Hemoglobin is borderline low but stable TB Gold: 12/16/2019 Neg   Current Dose per office note 02/03/2020: Cosentyx 150mg  every14 days   DX: Psoriatic arthritis   Patient to update labs at appointment on 08/29/2020.   Okay to refill Cosentyx?

## 2020-08-28 NOTE — Progress Notes (Signed)
Office Visit Note  Patient: Joseph [name]             Date of Birth: 15-Dec-1980           MRN: [id number]             PCP: Eunice Blase, MD Referring: Eunice Blase, MD Visit Date: 08/31/2020 Occupation: _0 @  Subjective:  Pain in both knee joints   History of Present Illness: Joseph [name] is a 39 y.o. male with history of psoriatic arthritis and osteoarthritis.  [name] currently on cosentyx 150 mg sq injections every 14 days.  He has not missed any doses of cosentyx recently.  He continues to go to physical therapy 3 days a week for 2-3 hours at a time to try to improve the contracture of his right elbow after fracturing it in February 2021.  He states his pain has been tolerable and he is hoping to be cleared by Dr. Grandville Silos to discontinue PT soon.  He has been taking tylenol as needed for pain relief.  He has been experiencing increased pain in both knee joints, especially his right knee joint.   He denies any joint swelling or mechanical symptoms. He denies any other joint pain or joint swelling.  He denies any achilles tendonitis or plantar fasciitis.  He denies any SI joint pain.  He has not noticed any psoriasis.  He has not had any recent infections.  He has not received the covid-19 vaccine.     Activities of Daily Living:  Patient reports morning stiffness for all  Day.    Patient Reports nocturnal pain.  Difficulty dressing/grooming: Denies Difficulty climbing stairs: Reports Difficulty getting out of chair: Denies Difficulty using hands for taps, buttons, cutlery, and/or writing: Denies  Review of Systems  Constitutional: Negative for fatigue.  HENT: Negative for mouth sores, mouth dryness and nose dryness.   Eyes: Negative for pain, itching and dryness.  Respiratory: Negative for shortness of breath and difficulty breathing.   Cardiovascular: Negative for chest pain and palpitations.  Gastrointestinal: Negative for blood in stool, constipation and diarrhea.    Endocrine: Negative for increased urination.  Genitourinary: Negative for difficulty urinating.  Musculoskeletal: Positive for arthralgias, joint pain and morning stiffness. Negative for joint swelling, myalgias, muscle tenderness and myalgias.  Skin: Negative for color change, rash and redness.  Allergic/Immunologic: Negative for susceptible to infections.  Neurological: Negative for dizziness, numbness, headaches, memory loss and weakness.  Hematological: Negative for bruising/bleeding tendency.  Psychiatric/Behavioral: Negative for confusion and sleep disturbance.    PMFS History:  Patient Active Problem List   Diagnosis Date Noted  . Elbow dislocation, right, initial encounter 03/06/2020  . Closed disp fx of right olecranon with intraartic exten w malunion 02/14/2020  . Morbid obesity with BMI of 45.0-49.9, adult ([place]) 11/25/2019  . Hair loss 12/31/2017  . Varicose veins of both lower extremities 07/26/2017  . Iron deficiency anemia 09/21/2014  . Psoriatic arthritis ([place]) 09/21/2014  . ESSENTIAL HYPERTENSION, MALIGNANT 02/18/2009  . Nonspecific (abnormal) findings on radiological and other examination of body structure 02/16/2009  . CHEST XRAY, ABNORMAL 02/16/2009    Past Medical History:  Diagnosis Date  . Acute sinus infection 11/07/2014  . Arthritis   . Blood transfusion without reported diagnosis    At [facility]   . Difficult intubation 01/13/2020   difficult airway in OR (per Dr Laurey Arrow)  . Iron deficiency anemia 09/21/2014  . Psoriasis   . Psoriatic arthritis ([place])   . Psoriatic  arthritis ([place])   . 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 Percival Spanish 02/2009 with echo, as needed f/u    Family History  Problem Relation Age of Onset  . Pulmonary fibrosis Mother   . Hypertension Mother   . Pancreatic cancer Maternal Grandmother   . Rheum arthritis Paternal Aunt    . Cancer Paternal Grandmother   . Breast cancer Paternal Grandmother   . COPD Paternal Grandfather   . CAD Paternal Grandfather   . Colon cancer Neg Hx   . Prostate cancer Neg Hx   . Diabetes Neg Hx   . Thyroid disease Neg Hx    Past Surgical History:  Procedure Laterality Date  . COLONOSCOPY WITH ESOPHAGOGASTRODUODENOSCOPY (EGD)    . EXTERNAL FIXATION REMOVAL Right 03/22/2020   Procedure: REMOVAL EXTERNAL FIXATION ARM;  Surgeon: Shona Needles, MD;  Location: [place];  Service: Orthopedics;  Laterality: Right;  . HERNIA REPAIR  1994   umbicial  . 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 LIGAMENT REPAIR Right 01/13/2020   Procedure: COLLATERAL LIGAMENT REPAIRS;  Surgeon: Milly Jakob, MD;  Location: [place];  Service: Orthopedics;  Laterality: Right;  . ORIF ELBOW FRACTURE Right 02/17/2020   Procedure: REVISION OPEN REDUCTION INTERNAL FIXATION (ORIF) ELBOW/OLECRANON FRACTURE;  Surgeon: Shona Needles, MD;  Location: [place];  Service: Orthopedics;  Laterality: Right;  . ORIF ULNAR FRACTURE Right 01/13/2020   Procedure: OPEN TREATMENT OF COMPLEX RIGHT PROXIMAL ULNA FRACTURE;  Surgeon: Milly Jakob, MD;  Location: [place];  Service: Orthopedics;  Laterality: Right;  PROCEDURE: OPEN TREATMENT OF COMPLEX RIGHT PROXIMAL ULNA FRACTURE WITH POSSIBLE COLLATERAL LIGAMENT REPAIRS LENGTH OF SURGERY: 2.5 [place] + REGIONAL BLOCK   Social History   Social History Narrative   Right handed   Caffeien use:1 soda per day   Immunization History  Administered Date(s) Administered  . MMR 02/12/2001  . Pneumococcal Polysaccharide-23 11/10/2012  . Td 08/17/2001     Objective: Vital Signs: BP (!) 149/114 (BP Location: Right Arm, Patient Position: Sitting, Cuff Size: Large)   Pulse 68   Resp 16   Ht _0  (1.778 m)   Wt (!) 322 lb 3.2 oz (146.1 kg)   BMI 46.23 kg/m    Physical Exam Vitals and nursing note reviewed.   Constitutional:      Appearance: [name] well-developed.  HENT:     Head: Normocephalic and atraumatic.  Eyes:     Conjunctiva/sclera: Conjunctivae normal.     Pupils: Pupils are equal, round, and reactive to light.  Pulmonary:     Effort: Pulmonary effort is normal.  Abdominal:     Palpations: Abdomen is soft.  Musculoskeletal:     Cervical back: Normal range of motion and neck supple.  Skin:    General: Skin is warm and dry.     Capillary Refill: Capillary refill takes less than 2 seconds.  Neurological:     Mental Status: [name] alert and oriented to person, place, and time.  Psychiatric:        Behavior: Behavior normal.      Musculoskeletal Exam: C-spine, thoracic spine, and lumbar spine good ROM.  Shoulder joints good ROM.  Right elbow contracture noted.  Left elbow has good ROM with no tenderness or inflammation.  Wrist joints, MCPs, PIPs, and DIPs good  ROM with no synovitis.  Complete fist formation bilaterally.  Hip joints good ROM with no discomfort.  Painful ROM of both knee joints.  Warmth of the right knee joint noted.  Ankle joints good ROM with no tenderness or inflammation.  No achilles tendonitis or plantar fasciitis.   CDAI Exam: CDAI Score: -- Patient Global: --; Provider Global: -- Swollen: --; Tender: -- Joint Exam 08/31/2020   No joint exam has been documented for this visit   There is currently no information documented on the homunculus. Go to the Rheumatology activity and complete the homunculus joint exam.  Investigation: No additional findings.  Imaging: No results found.  Recent Labs: Lab Results  Component Value Date   WBC 7.2 05/09/2020   HGB 13.0 (L) 05/09/2020   PLT 216 05/09/2020   NA 137 05/09/2020   K 3.8 05/09/2020   CL 102 05/09/2020   CO2 28 05/09/2020   GLUCOSE 111 (H) 05/09/2020   BUN 11 05/09/2020   CREATININE 0.92 05/09/2020   BILITOT 0.6 05/09/2020   ALKPHOS 104 03/11/2019   AST 20 05/09/2020   ALT 20 05/09/2020    PROT 7.5 05/09/2020   ALBUMIN 3.8 (L) 03/11/2019   CALCIUM 9.1 05/09/2020   GFRAA 123 05/09/2020   QFTBGOLD Negative 04/03/2017   QFTBGOLDPLUS NEGATIVE 12/16/2019    Speciality Comments: Prior therapy: Enbrel/Humira (inadequate response)  and Remicaide (d/c due to cost and time consuming infusion)  Procedures:  No procedures performed Allergies: Sulfonamide derivatives and Bee venom   Assessment / Plan:     Visit Diagnoses: Psoriatic arthritis ([place]): He has no synovitis or dactylitis on exam.  He has not had any recent psoriatic arthritis flares.  He has no Achilles tendinitis or plantar fasciitis.  He has no SI joint tenderness on examination today.  He has no active psoriasis at this time.  He has been experiencing increased pain in both knee joints over the past several months.  He has not had any recent injuries or falls.  [name] not experiencing any mechanical symptoms at this time.  He has painful range of motion of both knee joints on examination today.  Warmth of the right knee joint was noted.  X-rays of both knees were obtained for further evaluation.  He was not a good candidate for cortisone injection today due to elevated blood pressure.  He was advised to follow-up with his PCP to discuss antihypertensive medications.  Once his blood pressure is well controlled he can return for a cortisone injection.  In the meantime he will try using Voltaren gel topically as needed for pain relief.  Overall he is clinically been doing well on Cosentyx 150 mg subcutaneous injections every 14 days.  He has not missed any doses of Cosentyx recently.  He will continue on the current treatment regimen.  He does not need any refills at this time.  He was advised to notify us if he develops increased joint pain or joint swelling.  He will follow-up in the office in 5 months.  Psoriasis -He has no psoriasis at this time.  He has Clobetasol cream which he can apply as needed.   High risk medication use -  Cosentyx 150 mg sq injections once every 14 days.  CBC and CMP were drawn on 05/09/2020.  [name] overdue to update lab work today.  CBC and CMP will be drawn today to monitor for drug toxicity.  His next lab work will be done in January and every 3  months to monitor for drug toxicity.  Standing orders for CBC and CMP are in place.  TB gold negative on 12/16/2019 and will continue to be monitored yearly.- Plan: CBC with Differential/Platelet, COMPLETE METABOLIC PANEL WITH GFR He has not had any recent infections.  Discussed the importance of holding Cosentyx if he develops signs or symptoms of an infection and to resume once the infection has completely cleared.  He has not received the COVID-19 vaccination and does not plan to at this time.  We discussed notifying us or his PCP if he develops a COVID-19 infection in order to receive the monoclonal antibody infusion.  He was encouraged to continue to wear his mask and social distance.  He voiced understanding.  Primary osteoarthritis of both hands: He has PIP and DIP thickening consistent with osteoarthritis of both hands.  No joint tenderness or inflammation was noted.  [name] able to make a complete fist bilaterally.  Tendinopathy of right shoulder: He has good ROM of the right shoulder joint with no discomfort.   Pedal edema: Noted in bilateral lower extremities.   History of hypertension: BP was elevated today in the office-153/97 and 149/114 when rechecked.  He was advised to contact his PCP today to schedule an appointment to discuss antihypertensive medications.  Closed fracture of proximal end of right ulna with routine healing, unspecified fracture morphology, subsequent encounter: A fracture of the right proximal ulna on 12/15/2019 and underwent internal fixation by Dr. Grandville Silos on 01/13/2020.  He has been going to physical therapy 3 times a week for 2 to 3 hours at a time.  His discomfort has been tolerable but he continues to have a flexion contracture  in the right elbow.  [name] hoping to be cleared by Dr. Grandville Silos soon.    Chronic pain of both knees - He has been experiencing increased pain in both knee joints over the past several months.  He has not had any recent injuries or falls.  He has no mechanical symptoms at this time.  He has painful ROM bilaterally.  Warmth of the right knee joint was noted on exam.  X-rays of both knee joints were updated today in the office.  We opted out of performing a cortisone injection today due to an elevation in his blood pressure.  He was advised to follow up with PCP to discuss antihypertensive medications since his BP has been elevated for the past several months. Once his BP is managed he can return for a cortisone injection.  He was encouraged to try Voltaren gel which she can apply topically up to 4 times daily for pain relief.  We also discussed the use of ice, elevation, and compression.  He was also given a handout of knee joint exercises to perform.  Plan: XR KNEE 3 VIEW RIGHT, XR KNEE 3 VIEW LEFT  Other medical conditions are listed as follows:  Varicose veins of both lower extremities, unspecified whether complicated  History of iron deficiency anemia   Orders: Orders Placed This Encounter  Procedures  . XR KNEE 3 VIEW RIGHT  . XR KNEE 3 VIEW LEFT  . CBC with Differential/Platelet  . COMPLETE METABOLIC PANEL WITH GFR   No orders of the defined types were placed in this encounter.    Follow-Up Instructions: Return in about 5 months (around 01/29/2021) for Psoriatic arthritis.   Ofilia Neas, PA-C  Note - This record has been created using Dragon software.  Chart creation errors have been sought, but  may not always  have been located. Such creation errors do not reflect on  the standard of medical care.  

## 2020-08-31 ENCOUNTER — Encounter: Payer: Self-pay | Admitting: Physician Assistant

## 2020-08-31 ENCOUNTER — Ambulatory Visit: Payer: Self-pay

## 2020-08-31 ENCOUNTER — Other Ambulatory Visit: Payer: Self-pay

## 2020-08-31 ENCOUNTER — Ambulatory Visit (INDEPENDENT_AMBULATORY_CARE_PROVIDER_SITE_OTHER): Payer: 59 | Admitting: Physician Assistant

## 2020-08-31 VITALS — BP 149/114 | HR 68 | Resp 16 | Ht 70.0 in | Wt 322.2 lb

## 2020-08-31 DIAGNOSIS — M25562 Pain in left knee: Secondary | ICD-10-CM | POA: Diagnosis not present

## 2020-08-31 DIAGNOSIS — M19041 Primary osteoarthritis, right hand: Secondary | ICD-10-CM

## 2020-08-31 DIAGNOSIS — Z8679 Personal history of other diseases of the circulatory system: Secondary | ICD-10-CM

## 2020-08-31 DIAGNOSIS — L409 Psoriasis, unspecified: Secondary | ICD-10-CM | POA: Diagnosis not present

## 2020-08-31 DIAGNOSIS — R600 Localized edema: Secondary | ICD-10-CM

## 2020-08-31 DIAGNOSIS — G8929 Other chronic pain: Secondary | ICD-10-CM | POA: Diagnosis not present

## 2020-08-31 DIAGNOSIS — I8393 Asymptomatic varicose veins of bilateral lower extremities: Secondary | ICD-10-CM

## 2020-08-31 DIAGNOSIS — M19042 Primary osteoarthritis, left hand: Secondary | ICD-10-CM

## 2020-08-31 DIAGNOSIS — Z862 Personal history of diseases of the blood and blood-forming organs and certain disorders involving the immune mechanism: Secondary | ICD-10-CM

## 2020-08-31 DIAGNOSIS — S52001D Unspecified fracture of upper end of right ulna, subsequent encounter for closed fracture with routine healing: Secondary | ICD-10-CM

## 2020-08-31 DIAGNOSIS — M67911 Unspecified disorder of synovium and tendon, right shoulder: Secondary | ICD-10-CM

## 2020-08-31 DIAGNOSIS — L4050 Arthropathic psoriasis, unspecified: Secondary | ICD-10-CM

## 2020-08-31 DIAGNOSIS — M25561 Pain in right knee: Secondary | ICD-10-CM

## 2020-08-31 DIAGNOSIS — Z79899 Other long term (current) drug therapy: Secondary | ICD-10-CM

## 2020-08-31 NOTE — Patient Instructions (Addendum)
Standing Labs We placed an order today for your standing lab work.   Please have your standing labs drawn in January and every 3 months   If possible, please have your labs drawn 2 weeks prior to your appointment so that the provider can discuss your results at your appointment.  We have open lab daily Monday through Thursday from 8:30-12:30 PM and 1:30-4:30 PM and Friday from 8:30-12:30 PM and 1:30-4:00 PM at the office of Dr. Pollyann Savoy, [facility].   Please be advised, patients with office appointments requiring lab work will take precedents over walk-in lab work.  If possible, please come for your lab work on Monday and Friday afternoons, as you may experience shorter wait times. The office is located at [address], [place], [place] [address] No appointment is necessary.   Labs are drawn by [facility]. Please bring your co-pay at the time of your lab draw.  You may receive a bill from [facility] for your lab work.  If you wish to have your labs drawn at another location, please call the office 24 hours in advance to send orders.  If you have any questions regarding directions or hours of operation,  please call [phone].   As a reminder, please drink plenty of water prior to coming for your lab work. Thanks!   COVID-19 vaccine recommendations:   COVID-19 vaccine is recommended for everyone (unless you are allergic to a vaccine component), even if you are on a medication that suppresses your immune system.   If you are on Methotrexate, Cellcept (mycophenolate), Rinvoq, Harriette Ohara, and Olumiant- hold the medication for 1 week after each vaccine. Hold Methotrexate for 2 weeks after the single dose COVID-19 vaccine.   If you are on Orencia subcutaneous injection - hold medication one week prior to and one week after the first COVID-19 vaccine dose (only).   If you are on Orencia IV infusions- time vaccination administration so that the first COVID-19  vaccination will occur four weeks after the infusion and postpone the subsequent infusion by one week.   If you are on Cyclophosphamide or Rituxan infusions please contact your doctor prior to receiving the COVID-19 vaccine.   Do not take Tylenol or any anti-inflammatory medications (NSAIDs) 24 hours prior to the COVID-19 vaccination.   There is no direct evidence about the efficacy of the COVID-19 vaccine in individuals who are on medications that suppress the immune system.   Even if you are fully vaccinated, and you are on any medications that suppress your immune system, please continue to wear a mask, maintain at least six feet social distance and practice hand hygiene.   If you develop a COVID-19 infection, please contact your PCP or our office to determine if you need antibody infusion.  The booster vaccine is now available for immunocompromised patients. It is advised that if you had Pfizer vaccine you should get [employer] booster.  If you had a Moderna vaccine then you should get a Moderna booster. Johnson and Laural Benes does not have a booster vaccine at this time.  Please see the following web sites for updated information.   https://www.rheumatology.org/Portals/0/Files/COVID-19-Vaccination-Patient-Resources.pdf  https://www.rheumatology.org/About-Us/Newsroom/Press-Releases/ID/1159     Journal for Nurse Practitioners, 15(4), 385-683-2593. Retrieved August 16, 2018 from http://clinicalkey.com/nursing">  Knee Exercises Ask your health care provider which exercises are safe for you. Do exercises exactly as told by your health care provider and adjust them as directed. It is normal to feel mild stretching, pulling, tightness, or discomfort as you do  these exercises. Stop right away if you feel sudden pain or your pain gets worse. Do not begin these exercises until told by your health care provider. Stretching and range-of-motion exercises These exercises warm up your muscles and joints and  improve the movement and flexibility of your knee. These exercises also help to relieve pain and swelling. Knee extension, prone 1. Lie on your abdomen (prone position) on a bed. 2. Place your left / right knee just beyond the edge of the surface so your knee is not on the bed. You can put a towel under your left / right thigh just above your kneecap for comfort. 3. Relax your leg muscles and allow gravity to straighten your knee (extension). You should feel a stretch behind your left / right knee. 4. Hold this position for __________ seconds. 5. Scoot up so your knee is supported between repetitions. Repeat __________ times. Complete this exercise __________ times a day. Knee flexion, active  1. Lie on your back with both legs straight. If this causes back discomfort, bend your left / right knee so your foot is flat on the floor. 2. Slowly slide your left / right heel back toward your buttocks. Stop when you feel a gentle stretch in the front of your knee or thigh (flexion). 3. Hold this position for __________ seconds. 4. Slowly slide your left / right heel back to the starting position. Repeat __________ times. Complete this exercise __________ times a day. Quadriceps stretch, prone  1. Lie on your abdomen on a firm surface, such as a bed or padded floor. 2. Bend your left / right knee and hold your ankle. If you cannot reach your ankle or pant leg, loop a belt around your foot and grab the belt instead. 3. Gently pull your heel toward your buttocks. Your knee should not slide out to the side. You should feel a stretch in the front of your thigh and knee (quadriceps). 4. Hold this position for __________ seconds. Repeat __________ times. Complete this exercise __________ times a day. Hamstring, supine 1. Lie on your back (supine position). 2. Loop a belt or towel over the ball of your left / right foot. The ball of your foot is on the walking surface, right under your toes. 3. Straighten  your left / right knee and slowly pull on the belt to raise your leg until you feel a gentle stretch behind your knee (hamstring). ? Do not let your knee bend while you do this. ? Keep your other leg flat on the floor. 4. Hold this position for __________ seconds. Repeat __________ times. Complete this exercise __________ times a day. Strengthening exercises These exercises build strength and endurance in your knee. Endurance is the ability to use your muscles for a long time, even after they get tired. Quadriceps, isometric This exercise stretches the muscles in front of your thigh (quadriceps) without moving your knee joint (isometric). 1. Lie on your back with your left / right leg extended and your other knee bent. Put a rolled towel or small pillow under your knee if told by your health care provider. 2. Slowly tense the muscles in the front of your left / right thigh. You should see your kneecap slide up toward your hip or see increased dimpling just above the knee. This motion will push the back of the knee toward the floor. 3. For __________ seconds, hold the muscle as tight as you can without increasing your pain. 4. Relax the muscles slowly and  completely. Repeat __________ times. Complete this exercise __________ times a day. Straight leg raises This exercise stretches the muscles in front of your thigh (quadriceps) and the muscles that move your hips (hip flexors). 1. Lie on your back with your left / right leg extended and your other knee bent. 2. Tense the muscles in the front of your left / right thigh. You should see your kneecap slide up or see increased dimpling just above the knee. Your thigh may even shake a bit. 3. Keep these muscles tight as you raise your leg 4-6 inches (10-15 cm) off the floor. Do not let your knee bend. 4. Hold this position for __________ seconds. 5. Keep these muscles tense as you lower your leg. 6. Relax your muscles slowly and completely after each  repetition. Repeat __________ times. Complete this exercise __________ times a day. Hamstring, isometric 1. Lie on your back on a firm surface. 2. Bend your left / right knee about __________ degrees. 3. Dig your left / right heel into the surface as if you are trying to pull it toward your buttocks. Tighten the muscles in the back of your thighs (hamstring) to "dig" as hard as you can without increasing any pain. 4. Hold this position for __________ seconds. 5. Release the tension gradually and allow your muscles to relax completely for __________ seconds after each repetition. Repeat __________ times. Complete this exercise __________ times a day. Hamstring curls If told by your health care provider, do this exercise while wearing ankle weights. Begin with __________ lb weights. Then increase the weight by 1 lb (0.5 kg) increments. Do not wear ankle weights that are more than __________ lb. 1. Lie on your abdomen with your legs straight. 2. Bend your left / right knee as far as you can without feeling pain. Keep your hips flat against the floor. 3. Hold this position for __________ seconds. 4. Slowly lower your leg to the starting position. Repeat __________ times. Complete this exercise __________ times a day. Squats This exercise strengthens the muscles in front of your thigh and knee (quadriceps). 1. Stand in front of a table, with your feet and knees pointing straight ahead. You may rest your hands on the table for balance but not for support. 2. Slowly bend your knees and lower your hips like you are going to sit in a chair. ? Keep your weight over your heels, not over your toes. ? Keep your lower legs upright so they are parallel with the table legs. ? Do not let your hips go lower than your knees. ? Do not bend lower than told by your health care provider. ? If your knee pain increases, do not bend as low. 3. Hold the squat position for __________ seconds. 4. Slowly push with your  legs to return to standing. Do not use your hands to pull yourself to standing. Repeat __________ times. Complete this exercise __________ times a day. Wall slides This exercise strengthens the muscles in front of your thigh and knee (quadriceps). 1. Lean your back against a smooth wall or door, and walk your feet out 18-24 inches (46-61 cm) from it. 2. Place your feet hip-width apart. 3. Slowly slide down the wall or door until your knees bend __________ degrees. Keep your knees over your heels, not over your toes. Keep your knees in line with your hips. 4. Hold this position for __________ seconds. Repeat __________ times. Complete this exercise __________ times a day. Straight leg raises This exercise strengthens  the muscles that rotate the leg at the hip and move it away from your body (hip abductors). 1. Lie on your side with your left / right leg in the top position. Lie so your head, shoulder, knee, and hip line up. You may bend your bottom knee to help you keep your balance. 2. Roll your hips slightly forward so your hips are stacked directly over each other and your left / right knee is facing forward. 3. Leading with your heel, lift your top leg 4-6 inches (10-15 cm). You should feel the muscles in your outer hip lifting. ? Do not let your foot drift forward. ? Do not let your knee roll toward the ceiling. 4. Hold this position for __________ seconds. 5. Slowly return your leg to the starting position. 6. Let your muscles relax completely after each repetition. Repeat __________ times. Complete this exercise __________ times a day. Straight leg raises This exercise stretches the muscles that move your hips away from the front of the pelvis (hip extensors). 1. Lie on your abdomen on a firm surface. You can put a pillow under your hips if that is more comfortable. 2. Tense the muscles in your buttocks and lift your left / right leg about 4-6 inches (10-15 cm). Keep your knee straight  as you lift your leg. 3. Hold this position for __________ seconds. 4. Slowly lower your leg to the starting position. 5. Let your leg relax completely after each repetition. Repeat __________ times. Complete this exercise __________ times a day. This information is not intended to replace advice given to you by your health care provider. Make sure you discuss any questions you have with your health care provider. Document Revised: 08/17/2018 Document Reviewed: 08/17/2018 Elsevier Patient Education  2020 [employer]

## 2020-09-01 LAB — CBC WITH DIFFERENTIAL/PLATELET
Absolute Monocytes: 577 cells/uL (ref 200–950)
Basophils Absolute: 52 cells/uL (ref 0–200)
Basophils Relative: 0.7 %
Eosinophils Absolute: 229 cells/uL (ref 15–500)
Eosinophils Relative: 3.1 %
HCT: 42.4 % (ref 38.5–50.0)
Hemoglobin: 13.4 g/dL (ref 13.2–17.1)
Lymphs Abs: 1976 cells/uL (ref 850–3900)
MCH: 25.5 pg — ABNORMAL LOW (ref 27.0–33.0)
MCHC: 31.6 g/dL — ABNORMAL LOW (ref 32.0–36.0)
MCV: 80.8 fL (ref 80.0–100.0)
MPV: 10.8 fL (ref 7.5–12.5)
Monocytes Relative: 7.8 %
Neutro Abs: 4566 cells/uL (ref 1500–7800)
Neutrophils Relative %: 61.7 %
Platelets: 238 10*3/uL (ref 140–400)
RBC: 5.25 10*6/uL (ref 4.20–5.80)
RDW: 16.2 % — ABNORMAL HIGH (ref 11.0–15.0)
Total Lymphocyte: 26.7 %
WBC: 7.4 10*3/uL (ref 3.8–10.8)

## 2020-09-01 LAB — COMPLETE METABOLIC PANEL WITH GFR
AG Ratio: 1.1 (calc) (ref 1.0–2.5)
ALT: 15 U/L (ref 9–46)
AST: 15 U/L (ref 10–40)
Albumin: 3.8 g/dL (ref 3.6–5.1)
Alkaline phosphatase (APISO): 106 U/L (ref 36–130)
BUN: 12 mg/dL (ref 7–25)
CO2: 25 mmol/L (ref 20–32)
Calcium: 8.5 mg/dL — ABNORMAL LOW (ref 8.6–10.3)
Chloride: 102 mmol/L (ref 98–110)
Creat: 1.01 mg/dL (ref 0.60–1.35)
GFR, Est African American: 110 mL/min/{1.73_m2} (ref >=60)
GFR, Est Non African American: 95 mL/min/{1.73_m2} (ref >=60)
Globulin: 3.4 g/dL (calc) (ref 1.9–3.7)
Glucose, Bld: 93 mg/dL (ref 65–99)
Potassium: 4 mmol/L (ref 3.5–5.3)
Sodium: 137 mmol/L (ref 135–146)
Total Bilirubin: 0.6 mg/dL (ref 0.2–1.2)
Total Protein: 7.2 g/dL (ref 6.1–8.1)

## 2020-09-03 ENCOUNTER — Telehealth: Payer: Self-pay

## 2020-09-03 ENCOUNTER — Telehealth: Payer: 59 | Admitting: Emergency Medicine

## 2020-09-03 DIAGNOSIS — R0981 Nasal congestion: Secondary | ICD-10-CM | POA: Diagnosis not present

## 2020-09-03 MED ORDER — IPRATROPIUM BROMIDE 0.03 % NA SOLN
2.0000 | Freq: Two times a day (BID) | NASAL | 0 refills | Status: DC
Start: 2020-09-03 — End: 2020-10-12

## 2020-09-03 NOTE — Progress Notes (Signed)

## 2020-09-03 NOTE — Progress Notes (Signed)
CBC stable.  Calcium is borderline low-8.5. Please advise the patient to increase his daily calcium intake. Rest of CMP WNL.

## 2020-09-03 NOTE — Telephone Encounter (Signed)
Spoke with patient and advised him of bilateral knee x-ray results from 08/31/2020. Patient verbalized understanding.

## 2020-09-06 ENCOUNTER — Telehealth: Payer: Self-pay | Admitting: Rheumatology

## 2020-09-06 NOTE — Telephone Encounter (Signed)
Patient calling because he is having problems with his left hand x4 nights now. Hand locks up where he can not make a fist, numbness, and pain. Please call to discuss.

## 2020-09-06 NOTE — Telephone Encounter (Signed)
I called the patient to discuss the symptoms he has been experiencing.  According to the patient he woke up 4 nights ago and was experiencing significant stiffness and a locking sensation in his left hand.  He states that during the day he has noticed a sensation of numbness in the left hand.  He denies any joint swelling or joint pain at this time.  He tried taking Tylenol PM but did not notice any improvement in the stiffness or numbness in his left hand.  He has been going to physical therapy several days a week to work on strengthening and range of motion of his right elbow but does not feel as if physical therapy is aggravated his left wrist.  He has not missed any doses of Cosentyx but is unsure if it is as effective as it was previously.  He has an upcoming appointment with Dr. Prince Rome tomorrow to discuss management of his blood pressure and for a cortisone injection in his knees.  He plans on discussing the numbness he has been experiencing in his left hand with Dr. Prince Rome.  We discussed that if his symptoms persist or worsen he may require an NCV with EMG.  He was advised to schedule follow-up visit with [place] if his symptoms persist or worsen.

## 2020-09-06 NOTE — Telephone Encounter (Signed)
Patient states he is having numbness and "locking: in his left hand. Patient states he is having trouble making a fist. Patient states he has tried Tylenol PM with no relief. Patient states it is interrupting his sleep. Patient is on Cosentyx and has not missed any doses. Patient would like to know what else he may do. Please advise.

## 2020-09-07 ENCOUNTER — Ambulatory Visit (INDEPENDENT_AMBULATORY_CARE_PROVIDER_SITE_OTHER): Payer: 59 | Admitting: Family Medicine

## 2020-09-07 ENCOUNTER — Encounter: Payer: Self-pay | Admitting: Family Medicine

## 2020-09-07 ENCOUNTER — Other Ambulatory Visit: Payer: Self-pay

## 2020-09-07 VITALS — BP 178/120 | HR 74

## 2020-09-07 DIAGNOSIS — R200 Anesthesia of skin: Secondary | ICD-10-CM

## 2020-09-07 DIAGNOSIS — R202 Paresthesia of skin: Secondary | ICD-10-CM | POA: Diagnosis not present

## 2020-09-07 DIAGNOSIS — M1712 Unilateral primary osteoarthritis, left knee: Secondary | ICD-10-CM | POA: Diagnosis not present

## 2020-09-07 DIAGNOSIS — M1711 Unilateral primary osteoarthritis, right knee: Secondary | ICD-10-CM

## 2020-09-07 DIAGNOSIS — I10 Essential (primary) hypertension: Secondary | ICD-10-CM

## 2020-09-07 MED ORDER — ENALAPRIL MALEATE 5 MG PO TABS
5.0000 mg | ORAL_TABLET | Freq: Every day | ORAL | 3 refills | Status: DC
Start: 2020-09-07 — End: 2020-12-24

## 2020-09-07 NOTE — Progress Notes (Signed)
Office Visit Note   Patient: Joseph [name]           Date of Birth: June 08, 1981           MRN: [id number] Visit Date: 09/07/2020 Requested by: Lavada Mesi, MD [address] [place],  [place] [address] PCP: Lavada Mesi, MD  Subjective: Chief Complaint  Patient presents with   BP check    HPI: [name] here for blood pressure follow-up.  Since last visit his blood pressure has been high at 2 different locations.  [name] asymptomatic from a blood pressure standpoint.  [name] having troubles with bilateral knee pain due to osteoarthritis.  He was going to have injections, but did not because of his high blood pressure.  His left hand has been going to sleep mainly at night, keeping him from sleeping well.                ROS:   All other systems were reviewed and are negative.  Objective: Vital Signs: BP (!) 178/120    Pulse 74   Physical Exam:  General:  Alert and oriented, in no acute distress. Pulm:  Breathing unlabored. Psy:  Normal mood, congruent affect. Skin: No erythema or rash CV: Regular rate and rhythm without murmurs, rubs, or gallops.  No peripheral edema.  2+ radial and posterior tibial pulses. Lungs: Clear to auscultation throughout with no wheezing or areas of consolidation. Left hand: Mildly positive Tinel's at the carpal tunnel, positive Phalen's test.  No atrophy of the thenar muscles. Knees: 1+ patellofemoral crepitus bilaterally.  Trace effusion bilaterally with no warmth.    Imaging: No results found.  Assessment & Plan: 1.  Hypertension -We will start enalapril 5 mg daily.  Return in a month for blood pressure check.  Probably check basic metabolic panel as well.  2.  Bilateral knee osteoarthritis -Steroid injections today.  3.  Left hand numbness, suspect carpal tunnel syndrome -Carpal tunnel night splint.  Nerve studies if symptoms persist.     Procedures: Bilateral knee steroid injections: After sterile prep with Betadine, injected 5  cc 1% lidocaine without epinephrine and 40 mg methylprednisolone from lateral midpatellar approach into each knee.   PMFS History: Patient Active Problem List   Diagnosis Date Noted   Elbow dislocation, right, initial encounter 03/06/2020   Closed disp fx of right olecranon with intraartic exten w malunion 02/14/2020   Morbid obesity with BMI of 45.0-49.9, adult (HCC) 11/25/2019   Hair loss 12/31/2017   Varicose veins of both lower extremities 07/26/2017   Iron deficiency anemia 09/21/2014   Psoriatic arthritis (HCC) 09/21/2014   ESSENTIAL HYPERTENSION, MALIGNANT 02/18/2009   Nonspecific (abnormal) findings on radiological and other examination of body structure 02/16/2009   CHEST XRAY, ABNORMAL 02/16/2009   Past Medical History:  Diagnosis Date   Acute sinus infection 11/07/2014   Arthritis    Blood transfusion without reported diagnosis    At [facility]    Difficult intubation 01/13/2020   difficult airway in OR (per Dr Rollene Fare)   Iron deficiency anemia 09/21/2014   Psoriasis    Psoriatic arthritis (HCC)    Psoriatic arthritis (HCC)    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 Antoine Poche 02/2009 with echo, as needed f/u    Family History  Problem Relation Age of Onset   Pulmonary fibrosis Mother    Hypertension Mother    Pancreatic  cancer Maternal Grandmother    Rheum arthritis Paternal Aunt    Cancer Paternal Grandmother    Breast cancer Paternal Grandmother    COPD Paternal Grandfather    CAD Paternal Grandfather    Colon cancer Neg Hx    Prostate cancer Neg Hx    Diabetes Neg Hx    Thyroid disease Neg Hx     Past Surgical History:  Procedure Laterality Date   COLONOSCOPY WITH ESOPHAGOGASTRODUODENOSCOPY (EGD)     EXTERNAL FIXATION REMOVAL Right 03/22/2020   Procedure: REMOVAL EXTERNAL FIXATION ARM;  Surgeon: Roby Lofts, MD;   Location: [facility] OR;  Service: Orthopedics;  Laterality: Right;   HERNIA REPAIR  1994   umbicial   INNER EAR SURGERY Left    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, 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   LIGAMENT REPAIR Right 01/13/2020   Procedure: COLLATERAL LIGAMENT REPAIRS;  Surgeon: Mack Hook, MD;  Location: [facility];  Service: Orthopedics;  Laterality: Right;   ORIF ELBOW FRACTURE Right 02/17/2020   Procedure: REVISION OPEN REDUCTION INTERNAL FIXATION (ORIF) ELBOW/OLECRANON FRACTURE;  Surgeon: Roby Lofts, MD;  Location: [facility] OR;  Service: Orthopedics;  Laterality: Right;   ORIF ULNAR FRACTURE Right 01/13/2020   Procedure: OPEN TREATMENT OF COMPLEX RIGHT PROXIMAL ULNA FRACTURE;  Surgeon: Mack Hook, MD;  Location:  SURGERY CENTER;  Service: Orthopedics;  Laterality: Right;  PROCEDURE: OPEN TREATMENT OF COMPLEX RIGHT PROXIMAL ULNA FRACTURE WITH POSSIBLE COLLATERAL LIGAMENT REPAIRS LENGTH OF SURGERY: 2.5 HOURSMAC + REGIONAL BLOCK   Social History   Occupational History   Not on file  Tobacco Use   Smoking status: Never Smoker   Smokeless tobacco: Never Used  Vaping Use   Vaping Use: Never used  Substance and Sexual Activity   Alcohol use: No   Drug use: No   Sexual activity: Yes

## 2020-09-18 ENCOUNTER — Other Ambulatory Visit: Payer: Self-pay | Admitting: Physician Assistant

## 2020-09-18 DIAGNOSIS — L409 Psoriasis, unspecified: Secondary | ICD-10-CM

## 2020-09-18 DIAGNOSIS — L4050 Arthropathic psoriasis, unspecified: Secondary | ICD-10-CM

## 2020-09-18 NOTE — Telephone Encounter (Signed)
Last Visit: 08/31/2020 Next Visit: 02/01/2021 Labs: 08/31/2020 CBC stable. Calcium is borderline low-8.5. Rest of CMP WNL.  TB Gold: 12/16/2019 Neg   Current Dose per office note on 08/31/2020: Cosentyx 150 mg sq injections once every 14 days Dx: Psoriatic arthritis   Okay to refill Cosentyx?

## 2020-10-03 ENCOUNTER — Ambulatory Visit: Payer: 59 | Admitting: Family Medicine

## 2020-10-12 ENCOUNTER — Encounter: Payer: Self-pay | Admitting: Family Medicine

## 2020-10-12 ENCOUNTER — Ambulatory Visit (INDEPENDENT_AMBULATORY_CARE_PROVIDER_SITE_OTHER): Payer: 59 | Admitting: Family Medicine

## 2020-10-12 ENCOUNTER — Other Ambulatory Visit: Payer: Self-pay

## 2020-10-12 VITALS — BP 130/87 | HR 80 | Ht 70.0 in | Wt 315.0 lb

## 2020-10-12 DIAGNOSIS — I10 Essential (primary) hypertension: Secondary | ICD-10-CM | POA: Diagnosis not present

## 2020-10-12 NOTE — Progress Notes (Signed)
Office Visit Note   Patient: Joseph [name]           Date of Birth: 03-Sep-1981           MRN: [id number] Visit Date: 10/12/2020 Requested by: Lavada Mesi, MD [address] [place],  [place] [address] PCP: Lavada Mesi, MD  Subjective: Chief Complaint  Patient presents with  . Blood Pressure Check    HPI: [name] here for hypertension follow-up.  Last visit he started enalapril 5 mg daily.  He has done well on this regimen, no side effects.  He has lost 7 pounds unintentionally, he thinks is from not having as much time to eat since he is working a new job.  Unfortunately his grandfather passed away this past week.                 ROS:   All other systems were reviewed and are negative.  Objective: Vital Signs: BP 130/87   Pulse 80   Ht 5\' 10"  (1.778 m)   Wt (!) 315 lb (142.9 kg)   BMI 45.20 kg/m   Physical Exam:  General:  Alert and oriented, in no acute distress. Pulm:  Breathing unlabored. Psy:  Normal mood, congruent affect.  CV: Regular rate and rhythm without murmurs, rubs, or gallops.  No peripheral edema.  2+ radial and posterior tibial pulses. Lungs: Clear to auscultation throughout with no wheezing or areas of consolidation.    Imaging: No results found.  Assessment & Plan: 1.  Hypertension, now under good control. -Continue with enalapril.  Follow-up in 3 months for recheck, sooner for any problems.  Labs at next visit.     Procedures: No procedures performed  No notes on file     PMFS History: Patient Active Problem List   Diagnosis Date Noted  . Elbow dislocation, right, initial encounter 03/06/2020  . Closed disp fx of right olecranon with intraartic exten w malunion 02/14/2020  . Morbid obesity with BMI of 45.0-49.9, adult (HCC) 11/25/2019  . Hair loss 12/31/2017  . Varicose veins of both lower extremities 07/26/2017  . Iron deficiency anemia 09/21/2014  . Psoriatic arthritis (HCC) 09/21/2014  . ESSENTIAL HYPERTENSION, MALIGNANT  02/18/2009  . Nonspecific (abnormal) findings on radiological and other examination of body structure 02/16/2009  . CHEST XRAY, ABNORMAL 02/16/2009   Past Medical History:  Diagnosis Date  . Acute sinus infection 11/07/2014  . Arthritis   . Blood transfusion without reported diagnosis    At [facility]   . Difficult intubation 01/13/2020   difficult airway in OR (per Dr 03/14/2020)  . Iron deficiency anemia 09/21/2014  . Psoriasis   . Psoriatic arthritis (HCC)   . Psoriatic arthritis (HCC)   . 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 11/25/13 02/2009 with echo, as needed f/u    Family History  Problem Relation Age of Onset  . Pulmonary fibrosis Mother   . Hypertension Mother   . Pancreatic cancer Maternal Grandmother   . Rheum arthritis Paternal Aunt   . Cancer Paternal Grandmother   . Breast cancer Paternal Grandmother   . COPD Paternal Grandfather   . CAD Paternal Grandfather   . Colon cancer Neg Hx   . Prostate cancer Neg Hx   . Diabetes Neg Hx   . Thyroid disease Neg Hx     Past Surgical History:  Procedure Laterality Date  . COLONOSCOPY WITH  ESOPHAGOGASTRODUODENOSCOPY (EGD)    . EXTERNAL FIXATION REMOVAL Right 03/22/2020   Procedure: REMOVAL EXTERNAL FIXATION ARM;  Surgeon: Roby Lofts, MD;  Location: [facility];  Service: Orthopedics;  Laterality: Right;  . HERNIA REPAIR  1994   umbicial  . 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 LIGAMENT REPAIR Right 01/13/2020   Procedure: COLLATERAL LIGAMENT REPAIRS;  Surgeon: Mack Hook, MD;  Location: [facility];  Service: Orthopedics;  Laterality: Right;  . ORIF ELBOW FRACTURE Right 02/17/2020   Procedure: REVISION OPEN REDUCTION INTERNAL FIXATION (ORIF) ELBOW/OLECRANON FRACTURE;  Surgeon: Roby Lofts, MD;  Location: [facility] OR;  Service: Orthopedics;  Laterality: Right;  .  ORIF ULNAR FRACTURE Right 01/13/2020   Procedure: OPEN TREATMENT OF COMPLEX RIGHT PROXIMAL ULNA FRACTURE;  Surgeon: Mack Hook, MD;  Location: [facility];  Service: Orthopedics;  Laterality: Right;  PROCEDURE: OPEN TREATMENT OF COMPLEX RIGHT PROXIMAL ULNA FRACTURE WITH POSSIBLE COLLATERAL LIGAMENT REPAIRS LENGTH OF SURGERY: 2.5 [facility] + REGIONAL BLOCK   Social History   Occupational History  . Not on file  Tobacco Use  . Smoking status: Never Smoker  . Smokeless tobacco: Never Used  Vaping Use  . Vaping Use: Never used  Substance and Sexual Activity  . Alcohol use: No  . Drug use: No  . Sexual activity: Yes

## 2020-10-15 ENCOUNTER — Encounter: Payer: Self-pay | Admitting: Family Medicine

## 2020-10-15 DIAGNOSIS — K580 Irritable bowel syndrome with diarrhea: Secondary | ICD-10-CM

## 2020-11-15 ENCOUNTER — Encounter: Payer: Self-pay | Admitting: Family Medicine

## 2020-11-23 ENCOUNTER — Telehealth: Payer: Self-pay | Admitting: Pharmacist

## 2020-11-23 NOTE — Telephone Encounter (Signed)
Received notification from [employer] regarding a prior authorization for COSENTYX. Authorization has been APPROVED from 10/24/20 to 12/23/20.   Authorization # [id number]  Should be able to continue filling at [facility] (last filled 11/13/20) using copay card that's on file. Notified patient/wife via MyChart. [id number] ID- [id number] [facility]- [id number]  Chesley Mires, PharmD, MPH Clinical Pharmacist (Rheumatology and Pulmonology)

## 2020-11-23 NOTE — Telephone Encounter (Signed)
Submitted a Prior Authorization request to [employer] for COSENTYX via Cover My Meds. Will update once we receive a response.  Key: [id number]  Patient previously had [place] plan - now [place]. Eligible for Cosentyx copay card.

## 2020-11-23 NOTE — Progress Notes (Signed)
Office Visit Note  Patient: Joseph [name]             Date of Birth: 10-Apr-1981           MRN: [id number]             PCP: Eunice Blase, MD Referring: Eunice Blase, MD Visit Date: 11/28/2020 Occupation: _0 @  Subjective:  Left hand pain   History of Present Illness: Joseph [name] is a 40 y.o. male with history of psoriatic arthritis and osteoarthritis.  [name] on cosentyx 150 mg sq injections every 14 days. He has not missed any doses recently.  He present today with pain in the left hand.  He states he has been experiencing increased stiffness and has difficulty making a complete fist at this.  He denies any obvious joint swelling.  He has noticed some decreased grip strength in the left hand as well.  He states he has intermittent numbness and tingling in the left hand.  He has had intermittent nocturnal symptoms.  He has been using a carpal tunnel night splint but has not noticed much improvement in his symptoms.  He denies any discomfort in his right hand.  He continues to go to physical therapy 3 times a week to try to improve his ROM and strength s/p displaced right olecranon fracture.  He denies any achilles tendonitis or plantar fasciitis.  He denies any SI joint pain. He states his knee joint pain has improved.  He denies any recent infections.  He has received both pfizer vaccinations.     .   Activities of Daily Living:  Patient reports joint stiffness all day  Patient Reports nocturnal pain.  Difficulty dressing/grooming: Denies Difficulty climbing stairs: Denies Difficulty getting out of chair: Denies Difficulty using hands for taps, buttons, cutlery, and/or writing: Reports  Review of Systems  Constitutional: Negative for fatigue and night sweats.  HENT: Negative for mouth sores, mouth dryness and nose dryness.   Eyes: Negative for redness and dryness.  Respiratory: Negative for shortness of breath and difficulty breathing.   Cardiovascular: Positive for  swelling in legs/feet. Negative for chest pain, palpitations, hypertension and irregular heartbeat.  Gastrointestinal: Negative for constipation and diarrhea.  Endocrine: Negative for excessive thirst and increased urination.  Genitourinary: Negative for difficulty urinating and painful urination.  Musculoskeletal: Positive for arthralgias, joint pain, joint swelling, muscle weakness and morning stiffness. Negative for myalgias, muscle tenderness and myalgias.  Skin: Negative for color change, rash, hair loss, nodules/bumps, skin tightness, ulcers and sensitivity to sunlight.  Allergic/Immunologic: Negative for susceptible to infections.  Neurological: Positive for numbness. Negative for dizziness, fainting, memory loss and night sweats.  Hematological: Negative for bruising/bleeding tendency and swollen glands.  Psychiatric/Behavioral: Positive for sleep disturbance. Negative for depressed mood. The patient is not nervous/anxious.     PMFS History:  Patient Active Problem List   Diagnosis Date Noted  . Elbow dislocation, right, initial encounter 03/06/2020  . Closed disp fx of right olecranon with intraartic exten w malunion 02/14/2020  . Morbid obesity with BMI of 45.0-49.9, adult ([place]) 11/25/2019  . Hair loss 12/31/2017  . Varicose veins of both lower extremities 07/26/2017  . Iron deficiency anemia 09/21/2014  . Psoriatic arthritis ([place]) 09/21/2014  . ESSENTIAL HYPERTENSION, MALIGNANT 02/18/2009  . Nonspecific (abnormal) findings on radiological and other examination of body structure 02/16/2009  . CHEST XRAY, ABNORMAL 02/16/2009    Past Medical History:  Diagnosis Date  . Acute sinus infection 11/07/2014  . Arthritis   .  Blood transfusion without reported diagnosis    At [facility]   . Difficult intubation 01/13/2020   difficult airway in OR (per Dr Laurey Arrow)  . Iron deficiency anemia 09/21/2014  . Psoriasis   . Psoriatic arthritis ([place])   . Psoriatic arthritis ([place])    . 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 Percival Spanish 02/2009 with echo, as needed f/u    Family History  Problem Relation Age of Onset  . Pulmonary fibrosis Mother   . Hypertension Mother   . Pancreatic cancer Maternal Grandmother   . Rheum arthritis Paternal Aunt   . Cancer Paternal Grandmother   . Breast cancer Paternal Grandmother   . COPD Paternal Grandfather   . CAD Paternal Grandfather   . Colon cancer Neg Hx   . Prostate cancer Neg Hx   . Diabetes Neg Hx   . Thyroid disease Neg Hx    Past Surgical History:  Procedure Laterality Date  . COLONOSCOPY WITH ESOPHAGOGASTRODUODENOSCOPY (EGD)    . EXTERNAL FIXATION REMOVAL Right 03/22/2020   Procedure: REMOVAL EXTERNAL FIXATION ARM;  Surgeon: Shona Needles, MD;  Location: [facility];  Service: Orthopedics;  Laterality: Right;  . HERNIA REPAIR  1994   umbicial  . 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 LIGAMENT REPAIR Right 01/13/2020   Procedure: COLLATERAL LIGAMENT REPAIRS;  Surgeon: Milly Jakob, MD;  Location: [facility];  Service: Orthopedics;  Laterality: Right;  . ORIF ELBOW FRACTURE Right 02/17/2020   Procedure: REVISION OPEN REDUCTION INTERNAL FIXATION (ORIF) ELBOW/OLECRANON FRACTURE;  Surgeon: Shona Needles, MD;  Location: [facility];  Service: Orthopedics;  Laterality: Right;  . ORIF ULNAR FRACTURE Right 01/13/2020   Procedure: OPEN TREATMENT OF COMPLEX RIGHT PROXIMAL ULNA FRACTURE;  Surgeon: Milly Jakob, MD;  Location: [facility];  Service: Orthopedics;  Laterality: Right;  PROCEDURE: OPEN TREATMENT OF COMPLEX RIGHT PROXIMAL ULNA FRACTURE WITH POSSIBLE COLLATERAL LIGAMENT REPAIRS LENGTH OF SURGERY: 2.5 [place] + REGIONAL BLOCK   Social History   Social History Narrative   Right handed   Caffeien use:1 soda per day   Immunization History  Administered  Date(s) Administered  . MMR 02/12/2001  . PFIZER Comirnaty([place])Covid-19 Tri-Sucrose Vaccine 09/21/2020, 10/12/2020  . Pneumococcal Polysaccharide-23 11/10/2012  . Td 08/17/2001     Objective: Vital Signs: BP 137/86 (BP Location: Left Arm, Patient Position: Sitting, Cuff Size: Normal)   Pulse 93   Resp 16   Ht _0  (1.778 m)   Wt (!) 319 lb (144.7 kg)   BMI 45.77 kg/m    Physical Exam Vitals and nursing note reviewed.  Constitutional:      Appearance: [name] well-developed and well-nourished.  HENT:     Head: Normocephalic and atraumatic.  Eyes:     Extraocular Movements: EOM normal.     Conjunctiva/sclera: Conjunctivae normal.     Pupils: Pupils are equal, round, and reactive to light.  Pulmonary:     Effort: Pulmonary effort is normal.  Abdominal:     Palpations: Abdomen is soft.  Musculoskeletal:     Cervical back: Normal range of motion and neck supple.  Skin:    General: Skin is warm and dry.     Capillary Refill: Capillary refill takes less than 2 seconds.     Comments: Xerosis of forearms noted  Neurological:  Mental Status: [name] alert and oriented to person, place, and time.  Psychiatric:        Mood and Affect: Mood and affect normal.        Behavior: Behavior normal.      Musculoskeletal Exam: C-spine, thoracic spine, and lumbar spine good ROM. No midline spinal tenderness.  No SI joint tenderness. Shoulder joints, elbow joints, wrist joints, MCPs, PIPs, and DIPs good ROM with no synovitis.  Tenderness over the left 1st, 2nd, 3rd, 4th, and 5th MCP joints.  Negative tinel's sign. Hip joints good ROM with no discomfort.  Knee joints good ROM with no discomfort.  No warmth or effusion of knee joints. Ankle joints good ROM with no tenderness.  Pedal edema noted bilaterally.   CDAI Exam: CDAI Score: -- Patient Global: --; Provider Global: -- Swollen: 0 ; Tender: 5  Joint Exam 11/28/2020      Right  Left  MCP 1      Tender  MCP 2      Tender  MCP 3       Tender  MCP 4      Tender  MCP 5      Tender     Investigation: No additional findings.  Imaging: No results found.  Recent Labs: Lab Results  Component Value Date   WBC 7.4 08/31/2020   HGB 13.4 08/31/2020   PLT 238 08/31/2020   NA 137 08/31/2020   K 4.0 08/31/2020   CL 102 08/31/2020   CO2 25 08/31/2020   GLUCOSE 93 08/31/2020   BUN 12 08/31/2020   CREATININE 1.01 08/31/2020   BILITOT 0.6 08/31/2020   ALKPHOS 104 03/11/2019   AST 15 08/31/2020   ALT 15 08/31/2020   PROT 7.2 08/31/2020   ALBUMIN 3.8 (L) 03/11/2019   CALCIUM 8.5 (L) 08/31/2020   GFRAA 110 08/31/2020   QFTBGOLD Negative 04/03/2017   QFTBGOLDPLUS NEGATIVE 12/16/2019    Speciality Comments: Prior therapy: Enbrel/Humira (inadequate response)  and Remicaide (d/c due to cost and time consuming infusion)  Procedures:  No procedures performed Allergies: Sulfonamide derivatives and Bee venom   Assessment / Plan:     Visit Diagnoses: Psoriatic arthritis ([place]): He has no synovitis or dactylitis on exam.  He presents today with left hand pain and stiffness.  He has had difficulty making a complete fist and intermittent paresthesias in the left hand, which has progressively been worsening over the past 2-3 weeks.  He has tenderness of the left 1st-5th MCP joints but no synovitis was noted.  He has been sweeping at work while having to perform strictly light duty, which is likely exacerbating his discomfort.  [name] not experiencing any pain or inflammation in the right hand.  He has no other joint pain or joint swelling at this time.  He has no achilles tendonitis or plantar fasciitis.  He has no SI joint tenderness.  No active psoriasis at this time.  Overall he has clinically been doing well on cosentyx 150 mg sq injections every 14 days.  He will continue on the current treatment regimen.  A prescription for prednisone starting at 20 mg tapering by 5 mg every 2 days will be sent to the pharmacy.  He was advised  to take prednisone in the morning with breakfast and to monitor her blood pressure and sugar intake closely while on prednisone. He will also be referred to Dr. Ernestina Patches for a NCV with EMG for further evaluation of the paresthesias he has been  experiencing.  He was advised to notify us if his symptoms persist or worsen.  He will follow up in 3 months.  Psoriasis: He has no active psoriasis at this time.  Xerosis of forearms noted.   High risk medication use - Cosentyx 150 mg sq injections once every 14 days.CBC and CMP updated on 08/31/20.  [name] due to update lab work.  Orders for CBC and CMP were released.  His next lab work will be due in April and every 3 months to monitor for drug toxicity.  TB gold negative on 12/16/19  - Plan: COMPLETE METABOLIC PANEL WITH GFR, CBC with Differential/Platelet, QuantiFERON-TB Gold Plus He has not had any recent infections.  Discussed the importance of holding cosentyx if he develops signs or symptoms of an infection and to resume once the infection has completely cleared.  He has received both pfizer covid-19 vaccinations.   Screening for tuberculosis - Order for TB gold released today. Plan: QuantiFERON-TB Gold Plus  Left hand paresthesia: He presents today with severe pain and paresthesias in the left hand, which has progressively been worsening over the past 2-3 weeks.  No recent injuries.  He has been sweeping on a daily basis at work, which is likely exacerbating his symptoms.  He has started to wear a carpal tunnel night splint but has not noticed any improvement in his symptoms.  A referral to Dr. Ernestina Patches for NCV with EMG for further evaluation was placed today.   Primary osteoarthritis of both hands: He presents today with pain in his left hand.  He has been experiencing stiffness and difficulty making a complete fist.  His symptoms have progressively been worsening over the past 2-3 weeks.  He has not had any recent injuries but he has been sweeping for light  duty while at work, which is likely exacerbating his discomfort.  He has tenderness of the 1st-5th MCP joints of the left hand but no synovitis or dactylitis was noted.  He has some diffuse edema and slight difficulty making a complete fist.  He does not have any discomfort, joint tenderness, or synovitis noted in the right hand.  Discussed the importance of joint protection and muscle strengthening.   Tendinopathy of right shoulder: Resolved.  He has good ROM with no discomfort on exam.   Primary osteoarthritis of left knee: Moderate OA and mild chondromalacia patella evident on x-rays from 08/31/20. He has good ROM of the left knee with no discomfort.  No warmth or effusion of the left knee joint.    Chondromalacia patellae of right knee: X-rays of right knee on 08/31/20 revealed mild chondromalacia patella.  He has good ROM of the right knee joint with no discomfort.  No warmth or effusion of the right knee.   Pedal edema: Unchanged   History of hypertension: BP was 137/86 today in the office. Advised to monitor blood pressure closely while taking prednisone.   Closed fracture of proximal end of right ulna with routine healing, unspecified fracture morphology, subsequent encounter - A fracture of the right proximal ulna on 12/15/2019 and underwent internal fixation by Dr. Grandville Silos on 01/13/2020. He continues to go to physical therapy 3 times a week for ROM and strengthening exercises.   Other medical conditions are listed as follows:   History of iron deficiency anemia  Varicose veins of both lower extremities, unspecified whether complicated    Orders: Orders Placed This Encounter  Procedures  . COMPLETE METABOLIC PANEL WITH GFR  . CBC with Differential/Platelet  .  QuantiFERON-TB Gold Plus  . Ambulatory referral to Physical Medicine Rehab   Meds ordered this encounter  Medications  . predniSONE (DELTASONE) 5 MG tablet    Sig: Take 4 tablets by mouth daily x2 days, 3 tablets by  mouth daily x2 days, 2 tablets by mouth daily x2 days, 1 tablet by mouth daily x2 days.    Dispense:  20 tablet    Refill:  0     Follow-Up Instructions: Return in about 3 months (around 02/26/2021) for Psoriatic arthritis, Osteoarthritis.   Ofilia Neas, PA-C  Note - This record has been created using Dragon software.  Chart creation errors have been sought, but may not always  have been located. Such creation errors do not reflect on  the standard of medical care.

## 2020-11-28 ENCOUNTER — Other Ambulatory Visit: Payer: Self-pay

## 2020-11-28 ENCOUNTER — Encounter: Payer: Self-pay | Admitting: Physician Assistant

## 2020-11-28 ENCOUNTER — Ambulatory Visit: Payer: Managed Care, Other (non HMO) | Admitting: Physician Assistant

## 2020-11-28 VITALS — BP 137/86 | HR 93 | Resp 16 | Ht 70.0 in | Wt 319.0 lb

## 2020-11-28 DIAGNOSIS — Z8679 Personal history of other diseases of the circulatory system: Secondary | ICD-10-CM

## 2020-11-28 DIAGNOSIS — Z79899 Other long term (current) drug therapy: Secondary | ICD-10-CM | POA: Diagnosis not present

## 2020-11-28 DIAGNOSIS — M2241 Chondromalacia patellae, right knee: Secondary | ICD-10-CM

## 2020-11-28 DIAGNOSIS — M1712 Unilateral primary osteoarthritis, left knee: Secondary | ICD-10-CM

## 2020-11-28 DIAGNOSIS — G8929 Other chronic pain: Secondary | ICD-10-CM

## 2020-11-28 DIAGNOSIS — S52001D Unspecified fracture of upper end of right ulna, subsequent encounter for closed fracture with routine healing: Secondary | ICD-10-CM

## 2020-11-28 DIAGNOSIS — Z111 Encounter for screening for respiratory tuberculosis: Secondary | ICD-10-CM

## 2020-11-28 DIAGNOSIS — R202 Paresthesia of skin: Secondary | ICD-10-CM

## 2020-11-28 DIAGNOSIS — M19042 Primary osteoarthritis, left hand: Secondary | ICD-10-CM

## 2020-11-28 DIAGNOSIS — I8393 Asymptomatic varicose veins of bilateral lower extremities: Secondary | ICD-10-CM

## 2020-11-28 DIAGNOSIS — L4050 Arthropathic psoriasis, unspecified: Secondary | ICD-10-CM

## 2020-11-28 DIAGNOSIS — M67911 Unspecified disorder of synovium and tendon, right shoulder: Secondary | ICD-10-CM

## 2020-11-28 DIAGNOSIS — M19041 Primary osteoarthritis, right hand: Secondary | ICD-10-CM | POA: Diagnosis not present

## 2020-11-28 DIAGNOSIS — R600 Localized edema: Secondary | ICD-10-CM

## 2020-11-28 DIAGNOSIS — Z862 Personal history of diseases of the blood and blood-forming organs and certain disorders involving the immune mechanism: Secondary | ICD-10-CM

## 2020-11-28 DIAGNOSIS — L409 Psoriasis, unspecified: Secondary | ICD-10-CM

## 2020-11-28 MED ORDER — PREDNISONE 5 MG PO TABS: ORAL_TABLET | ORAL | 0 refills | Status: DC

## 2020-11-28 NOTE — Patient Instructions (Signed)
Standing Labs We placed an order today for your standing lab work.   Please have your standing labs drawn in April and every 3 months   If possible, please have your labs drawn 2 weeks prior to your appointment so that the provider can discuss your results at your appointment.  We have open lab daily Monday through Thursday from 8:30-12:30 PM and 1:30-4:30 PM and Friday from 8:30-12:30 PM and 1:30-4:00 PM at the office of Dr. Shaili Deveshwar, [facility].   Please be advised, patients with office appointments requiring lab work will take precedents over walk-in lab work.  If possible, please come for your lab work on Monday and Friday afternoons, as you may experience shorter wait times. The office is located at [address], [address], [place], [place] [address] No appointment is necessary.   Labs are drawn by [facility]. Please bring your co-pay at the time of your lab draw.  You may receive a bill from [facility] for your lab work.  If you wish to have your labs drawn at another location, please call the office 24 hours in advance to send orders.  If you have any questions regarding directions or hours of operation,  please call [phone].   As a reminder, please drink plenty of water prior to coming for your lab work. Thanks!   

## 2020-11-29 NOTE — Progress Notes (Signed)
WBC count is borderline elevated.  Absolute neutrophils are borderline elevated. Please clarify if he has had any recent infections.   Rest of CBC and CMP are stable.

## 2020-11-30 LAB — CBC WITH DIFFERENTIAL/PLATELET
Absolute Monocytes: 712 cells/uL (ref 200–950)
Basophils Absolute: 79 cells/uL (ref 0–200)
Basophils Relative: 0.7 %
Eosinophils Absolute: 147 cells/uL (ref 15–500)
Eosinophils Relative: 1.3 %
HCT: 41.2 % (ref 38.5–50.0)
Hemoglobin: 13.6 g/dL (ref 13.2–17.1)
Lymphs Abs: 2452 cells/uL (ref 850–3900)
MCH: 26.5 pg — ABNORMAL LOW (ref 27.0–33.0)
MCHC: 33 g/dL (ref 32.0–36.0)
MCV: 80.2 fL (ref 80.0–100.0)
MPV: 11.4 fL (ref 7.5–12.5)
Monocytes Relative: 6.3 %
Neutro Abs: 7910 cells/uL — ABNORMAL HIGH (ref 1500–7800)
Neutrophils Relative %: 70 %
Platelets: 262 10*3/uL (ref 140–400)
RBC: 5.14 10*6/uL (ref 4.20–5.80)
RDW: 15.5 % — ABNORMAL HIGH (ref 11.0–15.0)
Total Lymphocyte: 21.7 %
WBC: 11.3 10*3/uL — ABNORMAL HIGH (ref 3.8–10.8)

## 2020-11-30 LAB — COMPLETE METABOLIC PANEL WITH GFR
AG Ratio: 0.9 (calc) — ABNORMAL LOW (ref 1.0–2.5)
ALT: 18 U/L (ref 9–46)
AST: 17 U/L (ref 10–40)
Albumin: 3.8 g/dL (ref 3.6–5.1)
Alkaline phosphatase (APISO): 105 U/L (ref 36–130)
BUN: 13 mg/dL (ref 7–25)
CO2: 27 mmol/L (ref 20–32)
Calcium: 8.8 mg/dL (ref 8.6–10.3)
Chloride: 100 mmol/L (ref 98–110)
Creat: 0.99 mg/dL (ref 0.60–1.35)
GFR, Est African American: 112 mL/min/{1.73_m2} (ref >=60)
GFR, Est Non African American: 97 mL/min/{1.73_m2} (ref >=60)
Globulin: 4.1 g/dL (calc) — ABNORMAL HIGH (ref 1.9–3.7)
Glucose, Bld: 100 mg/dL — ABNORMAL HIGH (ref 65–99)
Potassium: 3.8 mmol/L (ref 3.5–5.3)
Sodium: 135 mmol/L (ref 135–146)
Total Bilirubin: 0.6 mg/dL (ref 0.2–1.2)
Total Protein: 7.9 g/dL (ref 6.1–8.1)

## 2020-11-30 LAB — QUANTIFERON-TB GOLD PLUS
Mitogen-NIL: >10 IU/mL
NIL: 0.03 IU/mL
QuantiFERON-TB Gold Plus: NEGATIVE
TB1-NIL: 0.01 IU/mL
TB2-NIL: 0 IU/mL

## 2020-11-30 NOTE — Progress Notes (Signed)
TB gold negative

## 2020-12-04 ENCOUNTER — Other Ambulatory Visit: Payer: Self-pay | Admitting: Physician Assistant

## 2020-12-04 DIAGNOSIS — L4050 Arthropathic psoriasis, unspecified: Secondary | ICD-10-CM

## 2020-12-04 DIAGNOSIS — L409 Psoriasis, unspecified: Secondary | ICD-10-CM

## 2020-12-04 NOTE — Telephone Encounter (Signed)
Last Visit: 11/28/2020 Next Visit: 05/01/2021 Labs: 11/28/2020,WBC count is borderline elevated. Absolute neutrophils are borderline elevated. Please clarify if he has had any recent infections.  Rest of CBC and CMP are stable.   TB Gold: 1/192022 negative  Current Dose per office note 11/28/2020, cosentyx 150 mg sq injections every 14 days  DX Psoriatic arthritis   Okay to refill Cosentyx ?

## 2020-12-21 ENCOUNTER — Other Ambulatory Visit: Payer: Self-pay

## 2020-12-21 ENCOUNTER — Ambulatory Visit (INDEPENDENT_AMBULATORY_CARE_PROVIDER_SITE_OTHER): Payer: Managed Care, Other (non HMO) | Admitting: Gastroenterology

## 2020-12-21 ENCOUNTER — Other Ambulatory Visit: Payer: Self-pay | Admitting: Gastroenterology

## 2020-12-21 ENCOUNTER — Other Ambulatory Visit (INDEPENDENT_AMBULATORY_CARE_PROVIDER_SITE_OTHER): Payer: Managed Care, Other (non HMO)

## 2020-12-21 ENCOUNTER — Encounter: Payer: Self-pay | Admitting: Gastroenterology

## 2020-12-21 ENCOUNTER — Ambulatory Visit: Payer: Managed Care, Other (non HMO) | Admitting: Physical Medicine and Rehabilitation

## 2020-12-21 ENCOUNTER — Encounter: Payer: Self-pay | Admitting: Physical Medicine and Rehabilitation

## 2020-12-21 VITALS — BP 118/82 | HR 90 | Ht 70.0 in | Wt 320.0 lb

## 2020-12-21 DIAGNOSIS — R202 Paresthesia of skin: Secondary | ICD-10-CM

## 2020-12-21 DIAGNOSIS — R197 Diarrhea, unspecified: Secondary | ICD-10-CM

## 2020-12-21 LAB — SEDIMENTATION RATE: Sed Rate: 67 mm/hr — ABNORMAL HIGH (ref 0–15)

## 2020-12-21 LAB — CBC
HCT: 42.7 % (ref 39.0–52.0)
Hemoglobin: 14 g/dL (ref 13.0–17.0)
MCHC: 32.9 g/dL (ref 30.0–36.0)
MCV: 79.6 fl (ref 78.0–100.0)
Platelets: 214 10*3/uL (ref 150.0–400.0)
RBC: 5.37 Mil/uL (ref 4.22–5.81)
RDW: 17.4 % — ABNORMAL HIGH (ref 11.5–15.5)
WBC: 10.4 10*3/uL (ref 4.0–10.5)

## 2020-12-21 LAB — COMPREHENSIVE METABOLIC PANEL
ALT: 15 U/L (ref 0–53)
AST: 13 U/L (ref 0–37)
Albumin: 4 g/dL (ref 3.5–5.2)
Alkaline Phosphatase: 108 U/L (ref 39–117)
BUN: 10 mg/dL (ref 6–23)
CO2: 27 mEq/L (ref 19–32)
Calcium: 8.9 mg/dL (ref 8.4–10.5)
Chloride: 99 mEq/L (ref 96–112)
Creatinine, Ser: 0.96 mg/dL (ref 0.40–1.50)
GFR: 100.88 mL/min (ref >60.00)
Glucose, Bld: 87 mg/dL (ref 70–99)
Potassium: 3.7 mEq/L (ref 3.5–5.1)
Sodium: 136 mEq/L (ref 135–145)
Total Bilirubin: 0.8 mg/dL (ref 0.2–1.2)
Total Protein: 8.3 g/dL (ref 6.0–8.3)

## 2020-12-21 LAB — TSH: TSH: 2.97 u[IU]/mL (ref 0.35–4.50)

## 2020-12-21 LAB — C-REACTIVE PROTEIN: CRP: 2.1 mg/dL (ref 0.5–20.0)

## 2020-12-21 MED ORDER — SUPREP BOWEL PREP KIT 17.5-3.13-1.6 GM/177ML PO SOLN: 1.0000 | ORAL | 0 refills | Status: DC

## 2020-12-21 NOTE — Progress Notes (Signed)
HPI: This is a very pleasant 40 year old man who was referred to me by Lavada Mesi, MD  to evaluate diarrhea, GI bleeding    He has had frequency and diarrhea mixed with solid stools for several years.  He tells me he goes 10 times a day on average and sometimes will be woken at night to move his bowels as well.  He sees blood mixed in with his stools usually with every bowel movement.  He currently drinks 10 diet Dr. Alcus Dad every day.  He used to drink even more and it would be [facility].  He periodically drinks sweet teas.  He does not have any abdominal pains, his weight is increasing, 20 pounds in the past 6 months or so.  No perianal discomforts.  Colon cancer, Crohn's disease, colitis do not run in his family as far as he knows.  Old Data Reviewed: Blood work January 2022 CBC was normal except for a white blood cell count of 11,000 complete metabolic profile normal  Review of systems: Pertinent positive and negative review of systems were noted in the above HPI section. All other review negative.   Past Medical History:  Diagnosis Date  . Acute sinus infection 11/07/2014  . Arthritis   . Blood transfusion without reported diagnosis    At [facility]   . Difficult intubation 01/13/2020   difficult airway in OR (per Dr Rollene Fare)  . Iron deficiency anemia 09/21/2014  . Psoriasis   . Psoriatic arthritis (HCC)   . Psoriatic arthritis (HCC)   . 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 Antoine Poche 02/2009 with echo, as needed f/u  Morbid obesity with a BMI near 46  Past Surgical History:  Procedure Laterality Date  . COLONOSCOPY WITH ESOPHAGOGASTRODUODENOSCOPY (EGD)    . EXTERNAL FIXATION REMOVAL Right 03/22/2020   Procedure: REMOVAL EXTERNAL FIXATION ARM;  Surgeon: Roby Lofts, MD;  Location: [facility] OR;  Service: Orthopedics;  Laterality: Right;  . HERNIA REPAIR  1994    umbicial  . 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 LIGAMENT REPAIR Right 01/13/2020   Procedure: COLLATERAL LIGAMENT REPAIRS;  Surgeon: Mack Hook, MD;  Location: [facility];  Service: Orthopedics;  Laterality: Right;  . ORIF ELBOW FRACTURE Right 02/17/2020   Procedure: REVISION OPEN REDUCTION INTERNAL FIXATION (ORIF) ELBOW/OLECRANON FRACTURE;  Surgeon: Roby Lofts, MD;  Location: [facility] OR;  Service: Orthopedics;  Laterality: Right;  . ORIF ULNAR FRACTURE Right 01/13/2020   Procedure: OPEN TREATMENT OF COMPLEX RIGHT PROXIMAL ULNA FRACTURE;  Surgeon: Mack Hook, MD;  Location: [facility];  Service: Orthopedics;  Laterality: Right;  PROCEDURE: OPEN TREATMENT OF COMPLEX RIGHT PROXIMAL ULNA FRACTURE WITH POSSIBLE COLLATERAL LIGAMENT REPAIRS LENGTH OF SURGERY: 2.5 HOURSMAC + REGIONAL BLOCK    Current Outpatient Medications  Medication Sig Dispense Refill  . clobetasol cream (TEMOVATE) 0.05 % Apply topically 2 (two) times daily as needed. (Patient taking differently: Apply 1 application topically 2 (two) times daily as needed (irritation).) 30 g 1  . COSENTYX SENSOREADY, 300 MG, 150 MG/ML SOAJ INJECT 150MG  SUBCUTANEOUSLY EVERY 2 WEEKS 6 mL 0  . enalapril (VASOTEC) 5 MG tablet Take 1 tablet (5 mg total) by mouth daily. 30 tablet 3  . folic acid (FOLVITE) 1 MG tablet Take 1 tablet (1 mg total) by mouth daily. 90 tablet 3  No current facility-administered medications for this visit.    Allergies as of 12/21/2020 - Review Complete 12/21/2020  Allergen Reaction Noted  . Sulfonamide derivatives Other (See Comments)   . Bee venom Palpitations 10/08/2016    Family History  Problem Relation Age of Onset  . Pulmonary fibrosis Mother   . Hypertension Mother   . Pancreatic cancer Maternal Grandmother   . Rheum arthritis Paternal Aunt   . Cancer Paternal Grandmother   . Breast cancer Paternal Grandmother   . COPD Paternal  Grandfather   . CAD Paternal Grandfather   . Colon cancer Neg Hx   . Prostate cancer Neg Hx   . Diabetes Neg Hx   . Thyroid disease Neg Hx   . Liver cancer Neg Hx   . Stomach cancer Neg Hx   . Rectal cancer Neg Hx     Social History   Socioeconomic History  . Marital status: Married    Spouse name: Shanda Bumps  . Number of children: 0  . Years of education: 78  . Highest education level: Not on file  Occupational History  . Not on file  Tobacco Use  . Smoking status: Never Smoker  . Smokeless tobacco: Never Used  Vaping Use  . Vaping Use: Never used  Substance and Sexual Activity  . Alcohol use: No  . Drug use: No  . Sexual activity: Yes  Other Topics Concern  . Not on file  Social History Narrative   Right handed   Caffeien use:1 soda per day   Social Determinants of Health   Financial Resource Strain: Not on file  Food Insecurity: Not on file  Transportation Needs: Not on file  Physical Activity: Not on file  Stress: Not on file  Social Connections: Not on file  Intimate Partner Violence: Not on file     Physical Exam: BP 118/82   Pulse 90   Ht 5\' 10"  (1.778 m)   Wt (!) 320 lb (145.2 kg)   SpO2 98%   BMI 45.92 kg/m  Constitutional: Morbidly obese Psychiatric: alert and oriented x3 Eyes: extraocular movements intact Mouth: oral pharynx moist, no lesions Neck: supple no lymphadenopathy Cardiovascular: heart regular rate and rhythm Lungs: clear to auscultation bilaterally Abdomen: soft, nontender, nondistended, no obvious ascites, no peritoneal signs, normal bowel sounds Extremities: no lower extremity edema bilaterally Skin: no lesions on visible extremities   Assessment and plan: 40 y.o. male with chronic diarrhea, intermittent bleeding rectally, morbid obesity  I explained to him that I think he may have inflammatory bowel disease, perhaps diarrhea predominant IBS.  I think it is unlikely that a cancer would explain his symptoms.  Undoubtedly his  76 Dr. 5 every day is contributing to his GI misery and I recommend very strongly that he try to cut that back.  Is also 1500 completely worthless calories daily and I am sure that is adding to his morbid obesity.  I would begin his work-up with blood tests including CBC, complete metabolic profile, sedimentation rate, CRP, celiac sprue testing and thyroid testing as well as colonoscopy at his soonest convenience.  I see no reason for any further blood tests or imaging studies prior to then.  Please see the "Patient Instructions" section for addition details about the plan.   Alcus Dad, MD [facility] 12/21/2020, 10:44 AM  Cc: 02/18/2021, MD  Total time on date of encounter was 45  minutes (this included time spent preparing to see the patient reviewing records; obtaining and/or reviewing  separately obtained history; performing a medically appropriate exam and/or evaluation; counseling and educating the patient and family if present; ordering medications, tests or procedures if applicable; and documenting clinical information in the health record).

## 2020-12-21 NOTE — Progress Notes (Signed)
Difficulty making a fist with left hand. Wears a brace at night but it is not helping anymore. Injured right arm and had to use left hand more. Numbness, tingling, and pain. Fourth and fifth finger are worse. Right hand dominant No lotion per patient Numeric Pain Rating Scale and Functional Assessment Average Pain 9   In the last MONTH (on 0-10 scale) has pain interfered with the following?  1. General activity like being  able to carry out your everyday physical activities such as walking, climbing stairs, carrying groceries, or moving a chair?  Rating(8)

## 2020-12-21 NOTE — Patient Instructions (Signed)
If you are age 38 or older, your body mass index should be between 23-30. Your Body mass index is 45.92 kg/m. If this is out of the aforementioned range listed, please consider follow up with your Primary Care Provider.  If you are age 31 or younger, your body mass index should be between 19-25. Your Body mass index is 45.92 kg/m. If this is out of the aformentioned range listed, please consider follow up with your Primary Care Provider.   LABS:   Labwork has been ordered for you today. Our lab is located in the basement.  Press "B" on the elevator. The lab is located at the first door on the left as you exit the elevator.  HEALTHCARE LAWS AND MY CHART RESULTS: Due to recent changes in healthcare laws, you may see the results of your imaging and laboratory studies on MyChart before your provider has had a chance to review them.   We understand that in some cases there may be results that are confusing or concerning to you. Not all laboratory results come back in the same time frame and the provider may be waiting for multiple results in order to interpret others.  Please give [place] 48 hours in order for your provider to thoroughly review all the results before contacting the office for clarification of your results.   PROCEDURES:  You have been scheduled for a colonoscopy. Please follow the written instructions given to you at your visit today. Please pick up your prep supplies at the pharmacy within the next 1-3 days. If you use inhalers (even only as needed), please bring them with you on the day of your procedure.  Please try to cut back the amount of Dr. Reino Kent you are drinking.  It was great seeing you today!  Thank you for entrusting me with your care and choosing [facility].  Dr. Christella Hartigan

## 2020-12-21 NOTE — Addendum Note (Signed)
Addended by: Vincenza Hews on: 12/21/2020 11:29 AM   Modules accepted: Orders

## 2020-12-24 ENCOUNTER — Other Ambulatory Visit: Payer: Self-pay | Admitting: Family Medicine

## 2020-12-24 LAB — IGA: Immunoglobulin A: 655 mg/dL — ABNORMAL HIGH (ref 47–310)

## 2020-12-24 LAB — TISSUE TRANSGLUTAMINASE ABS,IGG,IGA
(tTG) Ab, IgA: <1 U/mL
(tTG) Ab, IgG: <1 U/mL

## 2020-12-27 NOTE — Progress Notes (Signed)
Joseph [name] - 40 y.o. male MRN [id number]  Date of birth: [birth year]  Office Visit Note: Visit Date: 12/21/2020 PCP: Lavada Mesi, MD Referred by: Lavada Mesi, MD  Subjective: Chief Complaint  Patient presents with  . Left Hand - Numbness   HPI:  Joseph [name] is a 40 y.o. male who comes in today at the request of Sherron Ales, PA-C for electrodiagnostic study of the Left upper extremities.  Patient is Right hand dominant.  He describes 9 out of 10 left hand pain difficulty making fist.  Has been wearing a brace at night but not really helping at this point will but was helping prior.  He reports he thinks his left hand is starting to bother more since he had to use it more since he injured his right arm.  He gets numbness and tingling in the hand somewhat more fourth and fifth digit.  No prior electrodiagnostic studies.  No frank radicular symptoms.   ROS Otherwise per HPI.  Assessment & Plan: Visit Diagnoses:    ICD-10-CM   1. 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 This pathology could not technically explain all of his symptoms.  Clinical correlation paramount.  There is no significant electrodiagnostic evidence of any other focal nerve entrapment, brachial plexopathy or cervical radiculopathy.   Recommendations: 1.  Follow-up with referring physician. 2.  Continue current management of symptoms. 3.  Continue use of resting splint at night-time and as needed during the day. 4.  Suggest surgical evaluation.  Meds & Orders: No orders of the defined types were placed in this encounter.   Orders Placed This Encounter  Procedures  . NCV with EMG (electromyography)    Follow-up: Return for Sherron Ales, PA-C.   Procedures: No procedures  performed  EMG & NCV Findings: Evaluation of the left median motor nerve showed prolonged distal onset latency (4.7 ms) and decreased conduction velocity (Elbow-Wrist, 49 m/s).  The left median (across palm) sensory nerve showed prolonged distal peak latency (Wrist, 4.8 ms).  All remaining nerves (as indicated in the following tables) were within normal limits.    All examined muscles (as indicated in the following table) showed no evidence of electrical instability.    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  This pathology could not technically explain all of his symptoms.  Clinical correlation paramount.  There is no significant electrodiagnostic evidence of any other focal nerve entrapment, brachial plexopathy or cervical radiculopathy.   Recommendations: 1.  Follow-up with referring physician. 2.  Continue current management of symptoms. 3.  Continue use of resting splint at night-time and as needed during the day. 4.  Suggest surgical evaluation.  ___________________________ [name] FAAPMR Board Certified, American Board of Physical Medicine and Rehabilitation    Nerve Conduction Studies Anti Sensory Summary Table   Stim Site NR Peak (ms) Norm Peak (ms) P-T Amp (V) Norm P-T Amp Site1 Site2 Delta-P (ms) Dist (cm) Vel (m/s) Norm Vel (m/s)  Left Median Acr Palm Anti Sensory (2nd Digit)  31.3C  Wrist    *4.8 <3.6 20.3 >10 Wrist Palm 2.8 0.0    Palm    2.0 <2.0 22.5  Left Radial Anti Sensory (Base 1st Digit)  31C  Wrist    2.1 <3.1 32.4  Wrist Base 1st Digit 2.1 0.0    Left Ulnar Anti Sensory (5th Digit)  31.4C  Wrist    3.1 <3.7 28.9 >15.0 Wrist 5th Digit 3.1 14.0 45 >38   Motor Summary Table   Stim Site NR Onset (ms) Norm Onset (ms) O-P Amp (mV) Norm O-P Amp Site1 Site2 Delta-0 (ms) Dist (cm) Vel (m/s)  Norm Vel (m/s)  Left Median Motor (Abd Poll Brev)  31.1C  Wrist    *4.7 <4.2 5.9 >5 Elbow Wrist 4.3 21.0 *49 >50  Elbow    9.0  5.0         Left Ulnar Motor (Abd Dig Min)  31.3C  Wrist    2.7 <4.2 8.8 >3 B Elbow Wrist 3.6 20.0 56 >53  B Elbow    6.3  8.8  A Elbow B Elbow 1.0 10.0 100 >53  A Elbow    7.3  8.7          EMG   Side Muscle Nerve Root Ins Act Fibs Psw Amp Dur Poly Recrt Int Dennie Bible Comment  Left Abd Poll Brev Median C8-T1 Nml Nml Nml Nml Nml 0 Nml Nml   Left 1stDorInt Ulnar C8-T1 Nml Nml Nml Nml Nml 0 Nml Nml   Left PronatorTeres Median C6-7 Nml Nml Nml Nml Nml 0 Nml Nml   Left Biceps Musculocut C5-6 Nml Nml Nml Nml Nml 0 Nml Nml   Left Deltoid Axillary C5-6 Nml Nml Nml Nml Nml 0 Nml Nml     Nerve Conduction Studies Anti Sensory Left/Right Comparison   Stim Site L Lat (ms) R Lat (ms) L-R Lat (ms) L Amp (V) R Amp (V) L-R Amp (%) Site1 Site2 L Vel (m/s) R Vel (m/s) L-R Vel (m/s)  Median Acr Palm Anti Sensory (2nd Digit)  31.3C  Wrist *4.8   20.3   Wrist Palm     Palm 2.0   22.5         Radial Anti Sensory (Base 1st Digit)  31C  Wrist 2.1   32.4   Wrist Base 1st Digit     Ulnar Anti Sensory (5th Digit)  31.4C  Wrist 3.1   28.9   Wrist 5th Digit 45     Motor Left/Right Comparison   Stim Site L Lat (ms) R Lat (ms) L-R Lat (ms) L Amp (mV) R Amp (mV) L-R Amp (%) Site1 Site2 L Vel (m/s) R Vel (m/s) L-R Vel (m/s)  Median Motor (Abd Poll Brev)  31.1C  Wrist *4.7   5.9   Elbow Wrist *49    Elbow 9.0   5.0         Ulnar Motor (Abd Dig Min)  31.3C  Wrist 2.7   8.8   B Elbow Wrist 56    B Elbow 6.3   8.8   A Elbow B Elbow 100    A Elbow 7.3   8.7            Waveforms:             Clinical History: No specialty comments available.     Objective:  VS:  HT:    WT:   BMI:     BP:   HR: bpm  TEMP: ( )  RESP:  Physical Exam Musculoskeletal:        General: No tenderness.     Comments: Inspection reveals no atrophy  of the bilateral APB or FDI or hand  intrinsics. There is no swelling, color changes, allodynia or dystrophic changes. There is 5 out of 5 strength in the bilateral wrist extension, finger abduction and long finger flexion. There is intact sensation to light touch in all dermatomal and peripheral nerve distributions.  There is a negative Hoffmann's test bilaterally.  Skin:    General: Skin is warm and dry.     Findings: No erythema or rash.  Neurological:     General: No focal deficit present.     Mental Status: [name] alert and oriented to person, place, and time.     Sensory: No sensory deficit.     Motor: No weakness or abnormal muscle tone.     Coordination: Coordination normal.     Gait: Gait normal.  Psychiatric:        Mood and Affect: Mood normal.        Behavior: Behavior normal.        Thought Content: Thought content normal.      Imaging: No results found.

## 2020-12-27 NOTE — Procedures (Signed)
EMG & NCV Findings: Evaluation of the left median motor nerve showed prolonged distal onset latency (4.7 ms) and decreased conduction velocity (Elbow-Wrist, 49 m/s).  The left median (across palm) sensory nerve showed prolonged distal peak latency (Wrist, 4.8 ms).  All remaining nerves (as indicated in the following tables) were within normal limits.    All examined muscles (as indicated in the following table) showed no evidence of electrical instability.    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  This pathology could not technically explain all of his symptoms.  Clinical correlation paramount.  There is no significant electrodiagnostic evidence of any other focal nerve entrapment, brachial plexopathy or cervical radiculopathy.   Recommendations: 1.  Follow-up with referring physician. 2.  Continue current management of symptoms. 3.  Continue use of resting splint at night-time and as needed during the day. 4.  Suggest surgical evaluation.  ___________________________ [name] FAAPMR Board Certified, American Board of Physical Medicine and Rehabilitation    Nerve Conduction Studies Anti Sensory Summary Table   Stim Site NR Peak (ms) Norm Peak (ms) P-T Amp (V) Norm P-T Amp Site1 Site2 Delta-P (ms) Dist (cm) Vel (m/s) Norm Vel (m/s)  Left Median Acr Palm Anti Sensory (2nd Digit)  31.3C  Wrist    *4.8 <3.6 20.3 >10 Wrist Palm 2.8 0.0    Palm    2.0 <2.0 22.5         Left Radial Anti Sensory (Base 1st Digit)  31C  Wrist    2.1 <3.1 32.4  Wrist Base 1st Digit 2.1 0.0    Left Ulnar Anti Sensory (5th Digit)  31.4C  Wrist    3.1 <3.7 28.9 >15.0 Wrist 5th Digit 3.1 14.0 45 >38   Motor Summary Table   Stim Site NR Onset (ms) Norm Onset (ms) O-P Amp (mV) Norm O-P Amp Site1 Site2 Delta-0 (ms) Dist (cm) Vel (m/s) Norm Vel (m/s)   Left Median Motor (Abd Poll Brev)  31.1C  Wrist    *4.7 <4.2 5.9 >5 Elbow Wrist 4.3 21.0 *49 >50  Elbow    9.0  5.0         Left Ulnar Motor (Abd Dig Min)  31.3C  Wrist    2.7 <4.2 8.8 >3 B Elbow Wrist 3.6 20.0 56 >53  B Elbow    6.3  8.8  A Elbow B Elbow 1.0 10.0 100 >53  A Elbow    7.3  8.7          EMG   Side Muscle Nerve Root Ins Act Fibs Psw Amp Dur Poly Recrt Int Dennie Bible Comment  Left Abd Poll Brev Median C8-T1 Nml Nml Nml Nml Nml 0 Nml Nml   Left 1stDorInt Ulnar C8-T1 Nml Nml Nml Nml Nml 0 Nml Nml   Left PronatorTeres Median C6-7 Nml Nml Nml Nml Nml 0 Nml Nml   Left Biceps Musculocut C5-6 Nml Nml Nml Nml Nml 0 Nml Nml   Left Deltoid Axillary C5-6 Nml Nml Nml Nml Nml 0 Nml Nml     Nerve Conduction Studies Anti Sensory Left/Right Comparison   Stim Site L Lat (ms) R Lat (ms) L-R Lat (ms) L Amp (V) R Amp (V) L-R Amp (%) Site1 Site2 L Vel (m/s) R Vel (m/s) L-R Vel (m/s)  Median Acr Palm Anti Sensory (2nd Digit)  31.3C  Wrist *4.8   20.3  Wrist Palm     Palm 2.0   22.5         Radial Anti Sensory (Base 1st Digit)  31C  Wrist 2.1   32.4   Wrist Base 1st Digit     Ulnar Anti Sensory (5th Digit)  31.4C  Wrist 3.1   28.9   Wrist 5th Digit 45     Motor Left/Right Comparison   Stim Site L Lat (ms) R Lat (ms) L-R Lat (ms) L Amp (mV) R Amp (mV) L-R Amp (%) Site1 Site2 L Vel (m/s) R Vel (m/s) L-R Vel (m/s)  Median Motor (Abd Poll Brev)  31.1C  Wrist *4.7   5.9   Elbow Wrist *49    Elbow 9.0   5.0         Ulnar Motor (Abd Dig Min)  31.3C  Wrist 2.7   8.8   B Elbow Wrist 56    B Elbow 6.3   8.8   A Elbow B Elbow 100    A Elbow 7.3   8.7            Waveforms:

## 2020-12-28 ENCOUNTER — Telehealth: Payer: Self-pay | Admitting: *Deleted

## 2020-12-28 NOTE — Telephone Encounter (Signed)
Attempted to contact the patient and left message for patient to call the office to provide Dr. Carollee Massed first name.

## 2020-12-28 NOTE — Telephone Encounter (Signed)
-----   Message from Gearldine Bienenstock, PA-C sent at 12/28/2020  1:51 PM EST ----- I called the patient to review NCV with EMG results.  He has an upcoming appointment with Dr. Janee Morn, and he plans on trying a cortisone injection prior to planning surgical release.  Please send Dr. Janee Morn NCV results.     Thank you  ----- Message ----- From: Tyrell Antonio, MD Sent: 12/27/2020   4:33 PM EST To: Gearldine Bienenstock, PA-C

## 2020-12-28 NOTE — Telephone Encounter (Signed)
Dr. Mack Hook at [facility]. Forwarded NCV to him.

## 2021-01-01 ENCOUNTER — Encounter: Payer: Self-pay | Admitting: Family Medicine

## 2021-01-02 ENCOUNTER — Encounter: Payer: Self-pay | Admitting: Family Medicine

## 2021-01-02 MED ORDER — BUSPIRONE HCL 15 MG PO TABS: 7.5000 mg | ORAL_TABLET | Freq: Two times a day (BID) | ORAL | 3 refills | Status: DC | PRN

## 2021-01-10 ENCOUNTER — Ambulatory Visit: Payer: 59 | Admitting: Family Medicine

## 2021-01-11 ENCOUNTER — Encounter: Payer: Self-pay | Admitting: Family Medicine

## 2021-01-11 ENCOUNTER — Other Ambulatory Visit: Payer: Self-pay

## 2021-01-11 ENCOUNTER — Ambulatory Visit (INDEPENDENT_AMBULATORY_CARE_PROVIDER_SITE_OTHER): Payer: BC Managed Care – PPO | Admitting: Family Medicine

## 2021-01-11 VITALS — BP 137/83 | HR 69 | Ht 70.0 in | Wt 323.4 lb

## 2021-01-11 DIAGNOSIS — G5602 Carpal tunnel syndrome, left upper limb: Secondary | ICD-10-CM

## 2021-01-11 DIAGNOSIS — M1711 Unilateral primary osteoarthritis, right knee: Secondary | ICD-10-CM

## 2021-01-11 DIAGNOSIS — M1712 Unilateral primary osteoarthritis, left knee: Secondary | ICD-10-CM | POA: Diagnosis not present

## 2021-01-11 DIAGNOSIS — I10 Essential (primary) hypertension: Secondary | ICD-10-CM | POA: Diagnosis not present

## 2021-01-11 NOTE — Progress Notes (Signed)
Office Visit Note   Patient: Joseph [name]           Date of Birth: Dec 28, 1980           MRN: [id number] Visit Date: 01/11/2021 Requested by: Lavada Mesi, MD [address] [place],  [place] [address] PCP: Lavada Mesi, MD  Subjective: Chief Complaint  Patient presents with  . Left Wrist - Pain, Follow-up    Would like to have the wrist injected with cortisone.  . Blood Pressure Check    Asking if we could do cortisone injections in both knees today, as well (last had in October of last year).    HPI: [name] here for blood pressure follow-up.  Doing well, readings are better at home.  [name] taking 5 mg daily.  His knees are bothering him again and he would like injections.  His left carpal tunnel syndrome is moderate, and he would like to try an injection prior to surgical consult.  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              ROS:   All other systems were reviewed and are negative.  Objective: Vital Signs: BP 137/83   Pulse 69   Ht 5\' 10"  (1.778 m)   Wt (!) 323 lb 6.4 oz (146.7 kg)   BMI 46.40 kg/m   Physical Exam:  General:  Alert and oriented, in no acute distress. Pulm:  Breathing unlabored. Psy:  Normal mood, congruent affect.  Left wrist: No thenar atrophy.  Positive Tinel's at the carpal tunnel. Knees: Trace effusions bilaterally.    Imaging: No results found.  Assessment & Plan: 1.  Hypertension, under better control. -No changes, call for refills when needed.  Follow-up in 6 months.  2.  Bilateral knee pain -Dextrose injections today.  We can repeat when needed.  If these only gives short-term relief, could contemplate getting approval for gel injections.  3.  Left carpal tunnel syndrome -Steroid injection today.  Surgical consult if he fails to improve.     Procedures: Bilateral knee injections: After sterile prep with Betadine, injected 6 cc 0.25% bupivacaine and 4 cc 50% dextrose from lateral  midpatellar approach into each knee.  Left wrist injection: After sterile prep with Betadine, injected 1 cc 0.25% bupivacaine without epinephrine and 3 mg betamethasone into the carpal tunnel.       PMFS History: Patient Active Problem List   Diagnosis Date Noted  . Elbow dislocation, right, initial encounter 03/06/2020  . Closed disp fx of right olecranon with intraartic exten w malunion 02/14/2020  . Morbid obesity with BMI of 45.0-49.9, adult (HCC) 11/25/2019  . Hair loss 12/31/2017  . Varicose veins of both lower extremities 07/26/2017  . Iron deficiency anemia 09/21/2014  . Psoriatic arthritis (HCC) 09/21/2014  . ESSENTIAL HYPERTENSION, MALIGNANT 02/18/2009  . Nonspecific (abnormal) findings on radiological and other examination of body structure 02/16/2009  . CHEST XRAY, ABNORMAL 02/16/2009   Past Medical History:  Diagnosis Date  . Acute sinus infection 11/07/2014  . Arthritis   . Blood transfusion without reported diagnosis    At [facility]   . Difficult intubation 01/13/2020   difficult airway in OR (per Dr 03/14/2020)  . Iron deficiency anemia 09/21/2014  . Psoriasis   . Psoriatic arthritis (HCC)   . Psoriatic arthritis (HCC)   . 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 Antoine Poche 02/2009 with echo, as needed f/u    Family History  Problem Relation Age of Onset  . Pulmonary fibrosis Mother   . Hypertension Mother   . Pancreatic cancer Maternal Grandmother   . Rheum arthritis Paternal Aunt   . Cancer Paternal Grandmother   . Breast cancer Paternal Grandmother   . COPD Paternal Grandfather   . CAD Paternal Grandfather   . Colon cancer Neg Hx   . Prostate cancer Neg Hx   . Diabetes Neg Hx   . Thyroid disease Neg Hx   . Liver cancer Neg Hx   . Stomach cancer Neg Hx   . Rectal cancer Neg Hx     Past Surgical History:  Procedure Laterality Date  . COLONOSCOPY  WITH ESOPHAGOGASTRODUODENOSCOPY (EGD)    . EXTERNAL FIXATION REMOVAL Right 03/22/2020   Procedure: REMOVAL EXTERNAL FIXATION ARM;  Surgeon: Roby Lofts, MD;  Location: [facility];  Service: Orthopedics;  Laterality: Right;  . HERNIA REPAIR  1994   umbicial  . 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 LIGAMENT REPAIR Right 01/13/2020   Procedure: COLLATERAL LIGAMENT REPAIRS;  Surgeon: Mack Hook, MD;  Location: [facility];  Service: Orthopedics;  Laterality: Right;  . ORIF ELBOW FRACTURE Right 02/17/2020   Procedure: REVISION OPEN REDUCTION INTERNAL FIXATION (ORIF) ELBOW/OLECRANON FRACTURE;  Surgeon: Roby Lofts, MD;  Location: [facility] OR;  Service: Orthopedics;  Laterality: Right;  . ORIF ULNAR FRACTURE Right 01/13/2020   Procedure: OPEN TREATMENT OF COMPLEX RIGHT PROXIMAL ULNA FRACTURE;  Surgeon: Mack Hook, MD;  Location: [facility];  Service: Orthopedics;  Laterality: Right;  PROCEDURE: OPEN TREATMENT OF COMPLEX RIGHT PROXIMAL ULNA FRACTURE WITH POSSIBLE COLLATERAL LIGAMENT REPAIRS LENGTH OF SURGERY: 2.5 HOURSMAC + REGIONAL BLOCK   Social History   Occupational History  . Not on file  Tobacco Use  . Smoking status: Never Smoker  . Smokeless tobacco: Never Used  Vaping Use  . Vaping Use: Never used  Substance and Sexual Activity  . Alcohol use: No  . Drug use: No  . Sexual activity: Yes

## 2021-01-14 ENCOUNTER — Telehealth: Payer: Self-pay

## 2021-01-14 DIAGNOSIS — L4050 Arthropathic psoriasis, unspecified: Secondary | ICD-10-CM

## 2021-01-14 DIAGNOSIS — L409 Psoriasis, unspecified: Secondary | ICD-10-CM

## 2021-01-14 NOTE — Telephone Encounter (Signed)
Tammy from [facility] left a voicemail stating we did receive a fax with patient's insurance information.  That insurance has terminated as of 01/07/21.  If you happen to speak with patient, please have him call us with his new updated pharmacy coverage.  Our phone number is #[phone]

## 2021-01-15 NOTE — Telephone Encounter (Signed)
Sent MyChart message to patient requesting new insurance information be sent to clinic. We don't have anything new on file and eligibility search does not populate any information.

## 2021-01-24 ENCOUNTER — Other Ambulatory Visit: Payer: Self-pay | Admitting: Family Medicine

## 2021-01-29 NOTE — Telephone Encounter (Signed)
Patient is due for Cosentyx dose this Friday, 02/01/21 - however patient's insurance will not be active until next week.  Medication Samples have been provided to the patient.  Drug name: Cosentyx 150mg /mL Sensoready Qty: 1 pen LOT: Exp.Date: 06/2021  Sample has not yet been logged out in the binder.  07/2021, PharmD, MPH Clinical Pharmacist (Rheumatology and Pulmonology)

## 2021-01-30 ENCOUNTER — Telehealth: Payer: Self-pay

## 2021-01-30 NOTE — Telephone Encounter (Signed)
-----   Message from Rachael Fee, MD sent at 01/30/2021  5:28 AM EDT ----- Regarding: RE: [facility] pt Got it, thanks.  Korrin Waterfield, Please contact him about this, he needs colonoscopy at [facility], my next available appt for chronic diarrhea.  Thanks  ----- Message ----- From: Cathlyn Parsons, CRNA Sent: 01/29/2021   9:34 PM EDT To: Rachael Fee, MD Subject: [facility] pt                                         Dr. Christella Hartigan,  This pt is scheduled with you on 02/11/21.  [name] a documented difficult airway and his procedure will need to be done at the hospital.  Thanks,  Cathlyn Parsons

## 2021-01-31 ENCOUNTER — Other Ambulatory Visit: Payer: Self-pay

## 2021-01-31 DIAGNOSIS — R197 Diarrhea, unspecified: Secondary | ICD-10-CM

## 2021-01-31 MED ORDER — PEG 3350-KCL-NA BICARB-NACL 420 G PO SOLR: 4000.0000 mL | Freq: Once | ORAL | 0 refills | Status: DC

## 2021-01-31 NOTE — Telephone Encounter (Signed)
The pt has been rescheduled to 03/28/21 at 830 am at [facility] with Dr Christella Hartigan.  COVID test on 03/25/21 at 1005 am.  New instructions sent to the pt and [facility] colon has been cancelled.   Colon scheduled, pt instructed and medications reviewed.  Patient instructions mailed to home.  Patient to call with any questions or concerns.

## 2021-02-01 ENCOUNTER — Ambulatory Visit: Payer: 59 | Admitting: Rheumatology

## 2021-02-11 ENCOUNTER — Encounter: Payer: Managed Care, Other (non HMO) | Admitting: Gastroenterology

## 2021-02-15 ENCOUNTER — Telehealth: Payer: Self-pay | Admitting: *Deleted

## 2021-02-15 NOTE — Telephone Encounter (Signed)
Medication Samples have been provided to the patient.  Drug name: Cosentyx Strength: 150 mg  Qty: 1 box LOT: [id number] Exp.Date: Aug 2022  Dosing instructions: Inject 150 mg subcutaneously every 2 weeks.

## 2021-02-16 ENCOUNTER — Encounter: Payer: Self-pay | Admitting: Family Medicine

## 2021-02-18 ENCOUNTER — Telehealth: Payer: Self-pay | Admitting: Family Medicine

## 2021-02-18 DIAGNOSIS — M1712 Unilateral primary osteoarthritis, left knee: Secondary | ICD-10-CM

## 2021-02-18 DIAGNOSIS — M1711 Unilateral primary osteoarthritis, right knee: Secondary | ICD-10-CM

## 2021-02-18 NOTE — Telephone Encounter (Signed)
Is this WC case?  Please let me know.

## 2021-02-18 NOTE — Telephone Encounter (Signed)
Noted  

## 2021-02-18 NOTE — Telephone Encounter (Signed)
Requesting approval for bilateral knee gel injections for OA. 

## 2021-02-21 NOTE — Telephone Encounter (Signed)
Noted  

## 2021-02-26 ENCOUNTER — Other Ambulatory Visit (HOSPITAL_COMMUNITY): Payer: Self-pay

## 2021-02-26 NOTE — Telephone Encounter (Signed)
Submitted a Prior Authorization request to [facility] for COSENTYX via CoverMyMeds. Will update once we receive a response.  Key: Elvera Bicker

## 2021-02-28 NOTE — Telephone Encounter (Signed)
Urgent appeal for Cosentyx faxed to [employer].  Fax: [phone] Phone: [phone]

## 2021-03-01 ENCOUNTER — Telehealth: Payer: Self-pay

## 2021-03-01 NOTE — Telephone Encounter (Signed)
Called and left a Vm for patient to call back concerning gel injections and which option he would like to choose for gel injection due to not having insurance.

## 2021-03-01 NOTE — Telephone Encounter (Signed)
Medication Samples have been provided to the patient.  Drug name: Cosentyx       Strength: 150mg        Qty: 1  LOT:  Exp.Date: 07/09/2022  Dosing instructions: INJECT 150MG  SUBCUTANEOUSLY EVERY 2 WEEKS

## 2021-03-01 NOTE — Telephone Encounter (Signed)
VOB has been submitted for SynviscOne, bilateral knee. Pending BV. 

## 2021-03-05 ENCOUNTER — Telehealth: Payer: Self-pay

## 2021-03-05 NOTE — Telephone Encounter (Signed)
PA required for SynviscOne, bilateral knee. Submitted PA online through covermymeds. PA Pending

## 2021-03-06 ENCOUNTER — Telehealth: Payer: Self-pay

## 2021-03-06 NOTE — Telephone Encounter (Signed)
Talked with patient's wife and advised her that patient has been approved for gel injection.  Appointment has been scheduled.

## 2021-03-06 NOTE — Telephone Encounter (Addendum)
Approved for SynviscOne, bilateral knee. Buy & Bill Patient will be responsible for 10% OOP. No Co-pay PA Approval# [id number] Valid 03/05/2021- 09/01/2021  Appt. 03/07/2021 with Dr. Prince Rome

## 2021-03-07 ENCOUNTER — Other Ambulatory Visit (HOSPITAL_COMMUNITY): Payer: Self-pay

## 2021-03-07 ENCOUNTER — Ambulatory Visit (INDEPENDENT_AMBULATORY_CARE_PROVIDER_SITE_OTHER): Payer: BC Managed Care – PPO | Admitting: Family Medicine

## 2021-03-07 ENCOUNTER — Other Ambulatory Visit: Payer: Self-pay

## 2021-03-07 DIAGNOSIS — M1711 Unilateral primary osteoarthritis, right knee: Secondary | ICD-10-CM | POA: Diagnosis not present

## 2021-03-07 DIAGNOSIS — M1712 Unilateral primary osteoarthritis, left knee: Secondary | ICD-10-CM

## 2021-03-07 MED ORDER — COSENTYX SENSOREADY (300 MG) 150 MG/ML ~~LOC~~ SOAJ: SUBCUTANEOUS | 0 refills | Status: DC

## 2021-03-07 NOTE — Progress Notes (Signed)
Office Visit Note   Patient: Joseph [name]           Date of Birth: 1981-11-07           MRN: [id number] Visit Date: 03/07/2021 Requested by: Lavada Mesi, MD [address] [place],  [place] [address] PCP: Lavada Mesi, MD  Subjective: Chief Complaint  Patient presents with  . Right Knee - Pain, Follow-up    Planned bilateral Synvisc One injections  . Left Knee - Pain, Follow-up    HPI: [name] here for planned bilateral knee Synvisc 1 injections for osteoarthritis.              ROS:   All other systems were reviewed and are negative.  Objective: Vital Signs: There were no vitals taken for this visit.  Physical Exam:  General:  Alert and oriented, in no acute distress. Pulm:  Breathing unlabored. Psy:  Normal mood, congruent affect. Skin: No erythema Knees: 1+ effusion bilaterally.  Imaging: No results found.  Assessment & Plan: 1.  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  Follow-up as needed.     Procedures: Bilateral knee injections: After sterile prep with Betadine, injected 3 cc 0.25% bupivacaine and Synvisc-1 from lateral midpatellar approach, a flash of clear yellow synovial fluid was obtained prior to each injection.       PMFS History: Patient Active Problem List   Diagnosis Date Noted  . Elbow dislocation, right, initial encounter 03/06/2020  . Closed disp fx of right olecranon with intraartic exten w malunion 02/14/2020  . Morbid obesity with BMI of 45.0-49.9, adult (HCC) 11/25/2019  . Hair loss 12/31/2017  . Varicose veins of both lower extremities 07/26/2017  . Iron deficiency anemia 09/21/2014  . Psoriatic arthritis (HCC) 09/21/2014  . ESSENTIAL HYPERTENSION, MALIGNANT 02/18/2009  . Nonspecific (abnormal) findings on radiological and other examination of body structure 02/16/2009  . CHEST XRAY, ABNORMAL 02/16/2009   Past Medical History:  Diagnosis Date  . Acute sinus infection 11/07/2014  .  Arthritis   . Blood transfusion without reported diagnosis    At [facility]   . Difficult intubation 01/13/2020   difficult airway in OR (per Dr Rollene Fare)  . Iron deficiency anemia 09/21/2014  . Psoriasis   . Psoriatic arthritis (HCC)   . Psoriatic arthritis (HCC)   . 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 Antoine Poche 02/2009 with echo, as needed f/u    Family History  Problem Relation Age of Onset  . Pulmonary fibrosis Mother   . Hypertension Mother   . Pancreatic cancer Maternal Grandmother   . Rheum arthritis Paternal Aunt   . Cancer Paternal Grandmother   . Breast cancer Paternal Grandmother   . COPD Paternal Grandfather   . CAD Paternal Grandfather   . Colon cancer Neg Hx   . Prostate cancer Neg Hx   . Diabetes Neg Hx   . Thyroid disease Neg Hx   . Liver cancer Neg Hx   . Stomach cancer Neg Hx   . Rectal cancer Neg Hx     Past Surgical History:  Procedure Laterality Date  . COLONOSCOPY WITH ESOPHAGOGASTRODUODENOSCOPY (EGD)    . EXTERNAL FIXATION REMOVAL Right 03/22/2020   Procedure: REMOVAL EXTERNAL FIXATION ARM;  Surgeon: Roby Lofts, MD;  Location: [facility];  Service: Orthopedics;  Laterality: Right;  . HERNIA REPAIR  1994   umbicial  .  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 LIGAMENT REPAIR Right 01/13/2020   Procedure: COLLATERAL LIGAMENT REPAIRS;  Surgeon: Mack Hook, MD;  Location: [facility];  Service: Orthopedics;  Laterality: Right;  . ORIF ELBOW FRACTURE Right 02/17/2020   Procedure: REVISION OPEN REDUCTION INTERNAL FIXATION (ORIF) ELBOW/OLECRANON FRACTURE;  Surgeon: Roby Lofts, MD;  Location: [facility];  Service: Orthopedics;  Laterality: Right;  . ORIF ULNAR FRACTURE Right 01/13/2020   Procedure: OPEN TREATMENT OF COMPLEX RIGHT PROXIMAL ULNA FRACTURE;  Surgeon: Mack Hook, MD;  Location: [facility];  Service: Orthopedics;  Laterality: Right;  PROCEDURE: OPEN TREATMENT OF COMPLEX RIGHT PROXIMAL ULNA FRACTURE WITH POSSIBLE COLLATERAL LIGAMENT REPAIRS LENGTH OF SURGERY: 2.5 HOURSMAC + REGIONAL BLOCK   Social History   Occupational History  . Not on file  Tobacco Use  . Smoking status: Never Smoker  . Smokeless tobacco: Never Used  Vaping Use  . Vaping Use: Never used  Substance and Sexual Activity  . Alcohol use: No  . Drug use: No  . Sexual activity: Yes

## 2021-03-07 NOTE — Telephone Encounter (Signed)
Patient's new insurance requires patient to fill Cosentyx through [employer].  Cosentyx copay card:  [id number] ID- [id number] [facility]- [id number]

## 2021-03-07 NOTE — Addendum Note (Signed)
Addended by: Murrell Redden on: 03/07/2021 12:05 PM   Modules accepted: Orders

## 2021-03-07 NOTE — Telephone Encounter (Signed)
Cosentyx denial was overturned. PA approved from 02/26/21 through 02/25/22.  Cosentyx rx sent to [facility] for 1 month only. Patient provided with pharmacy phone number and copay card information via MyChart  Chesley Mires, PharmD, MPH Clinical Pharmacist (Rheumatology and Pulmonology)

## 2021-03-22 ENCOUNTER — Other Ambulatory Visit: Payer: Self-pay | Admitting: *Deleted

## 2021-03-22 DIAGNOSIS — L4050 Arthropathic psoriasis, unspecified: Secondary | ICD-10-CM

## 2021-03-22 DIAGNOSIS — L409 Psoriasis, unspecified: Secondary | ICD-10-CM

## 2021-03-22 MED ORDER — COSENTYX SENSOREADY (300 MG) 150 MG/ML ~~LOC~~ SOAJ
SUBCUTANEOUS | 0 refills | Status: DC
Start: 2021-03-22 — End: 2021-04-18

## 2021-03-22 NOTE — Telephone Encounter (Signed)
Next Visit: 05/01/2021  Last Visit: 11/28/2020  Last Fill: 03/07/2021  DX:  Psoriatic arthritis   Current Dose per office note 11/28/2020, Cosentyx 150 mg sq injections once every 14 days.  Labs:  12/21/2020, RDW 17.4,   LMOM labs are due.  TB Gold: 11/28/2020, negative  Okay to refill Cosentyx?

## 2021-03-24 ENCOUNTER — Other Ambulatory Visit: Payer: Self-pay | Admitting: Family Medicine

## 2021-03-25 ENCOUNTER — Other Ambulatory Visit (HOSPITAL_COMMUNITY): Payer: Self-pay

## 2021-03-26 ENCOUNTER — Encounter: Payer: Self-pay | Admitting: Family Medicine

## 2021-03-26 DIAGNOSIS — M1712 Unilateral primary osteoarthritis, left knee: Secondary | ICD-10-CM

## 2021-03-26 DIAGNOSIS — M1711 Unilateral primary osteoarthritis, right knee: Secondary | ICD-10-CM

## 2021-03-28 ENCOUNTER — Encounter (HOSPITAL_COMMUNITY): Payer: Self-pay

## 2021-03-28 ENCOUNTER — Ambulatory Visit (HOSPITAL_COMMUNITY): Admit: 2021-03-28 | Payer: BC Managed Care – PPO | Admitting: Gastroenterology

## 2021-03-28 SURGERY — COLONOSCOPY WITH PROPOFOL
Anesthesia: Monitor Anesthesia Care

## 2021-03-29 ENCOUNTER — Ambulatory Visit (INDEPENDENT_AMBULATORY_CARE_PROVIDER_SITE_OTHER): Payer: BC Managed Care – PPO

## 2021-03-29 ENCOUNTER — Other Ambulatory Visit: Payer: Self-pay

## 2021-03-29 DIAGNOSIS — E559 Vitamin D deficiency, unspecified: Secondary | ICD-10-CM

## 2021-03-29 DIAGNOSIS — M1712 Unilateral primary osteoarthritis, left knee: Secondary | ICD-10-CM

## 2021-03-29 DIAGNOSIS — M1711 Unilateral primary osteoarthritis, right knee: Secondary | ICD-10-CM

## 2021-03-29 DIAGNOSIS — L4050 Arthropathic psoriasis, unspecified: Secondary | ICD-10-CM

## 2021-03-29 NOTE — Progress Notes (Signed)
Lab visit only, per Dr. Junius Roads: uric acid, vitamin D 25-hydroxy, RF, CCP antibody and ANA. ESR & CRP were recently done at the rheumatology office.

## 2021-04-02 ENCOUNTER — Telehealth: Payer: Self-pay | Admitting: Family Medicine

## 2021-04-02 LAB — URIC ACID: Uric Acid, Serum: 6.8 mg/dL (ref 4.0–8.0)

## 2021-04-02 LAB — CYCLIC CITRUL PEPTIDE ANTIBODY, IGG: Cyclic Citrullin Peptide Ab: <16 UNITS

## 2021-04-02 LAB — ANTI-NUCLEAR AB-TITER (ANA TITER): ANA Titer 1: 1:40 {titer} — ABNORMAL HIGH

## 2021-04-02 LAB — EXTRA LAV TOP TUBE

## 2021-04-02 LAB — VITAMIN D 25 HYDROXY (VIT D DEFICIENCY, FRACTURES): Vit D, 25-Hydroxy: 22 ng/mL — ABNORMAL LOW (ref 30–100)

## 2021-04-02 LAB — ANA: Anti Nuclear Antibody (ANA): POSITIVE — AB

## 2021-04-02 LAB — RHEUMATOID FACTOR: Rheumatoid fact SerPl-aCnc: <14 IU/mL (ref <14)

## 2021-04-02 NOTE — Telephone Encounter (Signed)
Labs are notable for the following:  Vitamin D is still very low at 22.  We want this to be 50-80.  What is your current vitamin D dosage?  ANA is slightly positive, indicating autoimmunity of some sort.  All other rheumatology labs were normal.

## 2021-04-06 ENCOUNTER — Ambulatory Visit (HOSPITAL_BASED_OUTPATIENT_CLINIC_OR_DEPARTMENT_OTHER)
Admission: RE | Admit: 2021-04-06 | Discharge: 2021-04-06 | Disposition: A | Discharge status: Home / Self Care | Payer: BC Managed Care – PPO | Source: Ambulatory Visit | Attending: Family Medicine | Admitting: Family Medicine

## 2021-04-06 ENCOUNTER — Other Ambulatory Visit: Payer: Self-pay

## 2021-04-06 DIAGNOSIS — M1712 Unilateral primary osteoarthritis, left knee: Secondary | ICD-10-CM | POA: Insufficient documentation

## 2021-04-06 DIAGNOSIS — R600 Localized edema: Secondary | ICD-10-CM | POA: Diagnosis not present

## 2021-04-06 DIAGNOSIS — M1711 Unilateral primary osteoarthritis, right knee: Secondary | ICD-10-CM | POA: Insufficient documentation

## 2021-04-06 DIAGNOSIS — M25462 Effusion, left knee: Secondary | ICD-10-CM | POA: Diagnosis not present

## 2021-04-06 DIAGNOSIS — M23321 Other meniscus derangements, posterior horn of medial meniscus, right knee: Secondary | ICD-10-CM | POA: Diagnosis not present

## 2021-04-09 ENCOUNTER — Telehealth: Payer: Self-pay | Admitting: Family Medicine

## 2021-04-09 IMAGING — CT CT ELBOW*R* W/O CM
3 of 5 series · 12 of 33 positions shown, 15 images · non-contrast
Comparison: Images dated 01/13/2020 and 01/04/2020

CLINICAL DATA: Follow-up of fracture of the proximal ulna. Status
post open reduction and internal fixation.

EXAM:
CT OF THE LOWER RIGHT EXTREMITY WITHOUT CONTRAST
TECHNIQUE: Multidetector CT imaging of the right lower extremity was performed
according to the standard protocol.

[Series 9: 1.5 mm bone ax · axial · 0.38mm/px · z∈[-260,-165]mm · 5 of 95 slices shown, 7 images]
[im 16/95  soft-tissue]
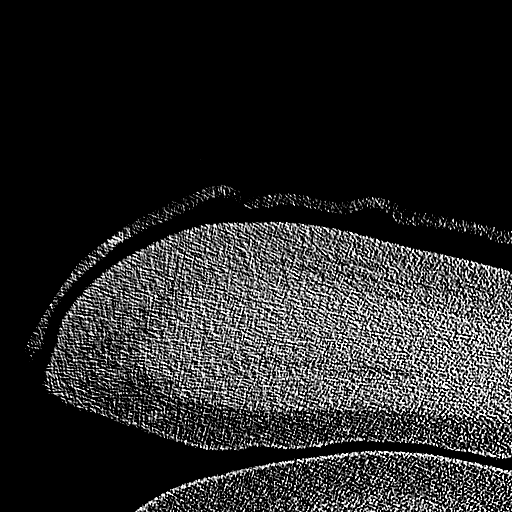
[im 16/95  bone]
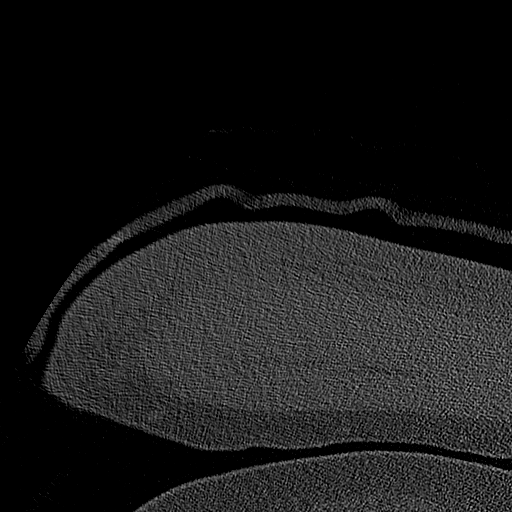
[im 32/95  bone]
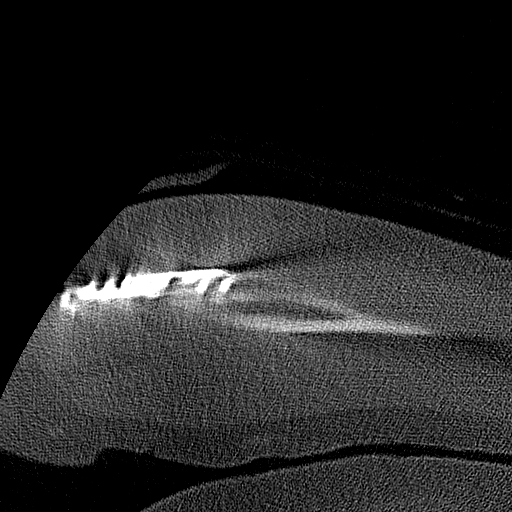
[im 48/95  bone]
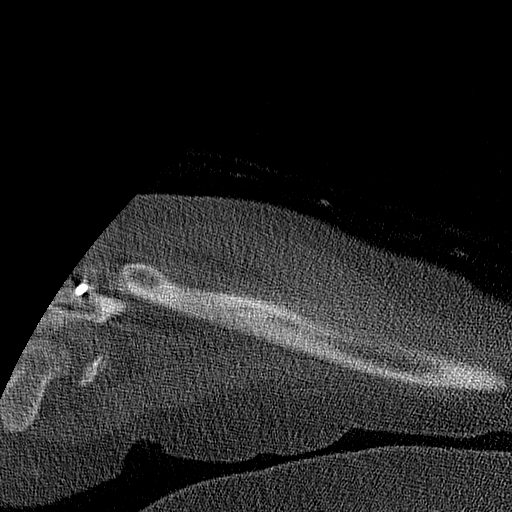
[im 63/95  bone]
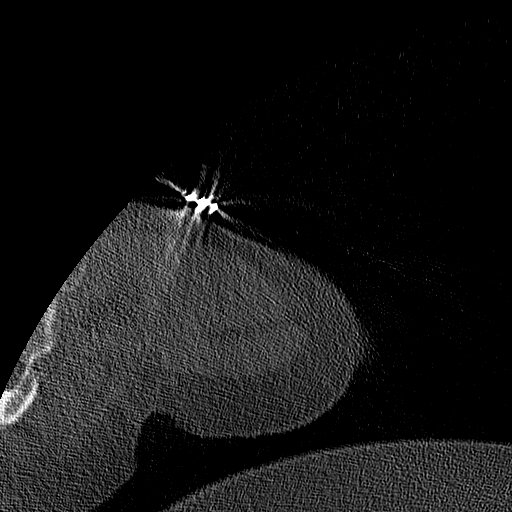
[im 79/95  soft-tissue]
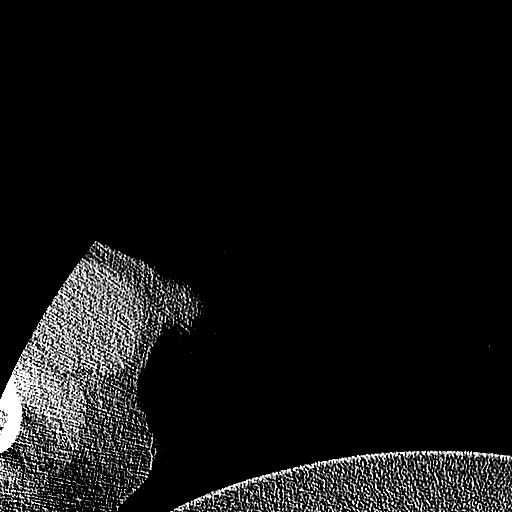
[im 79/95  bone]
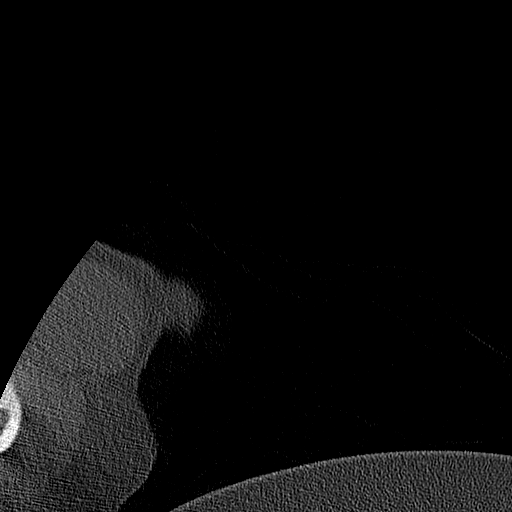

[Series 14: cor 1.5 mm bone · coronal · 0.27mm/px · 2 of 133 slices shown]
[im 13/133  bone]
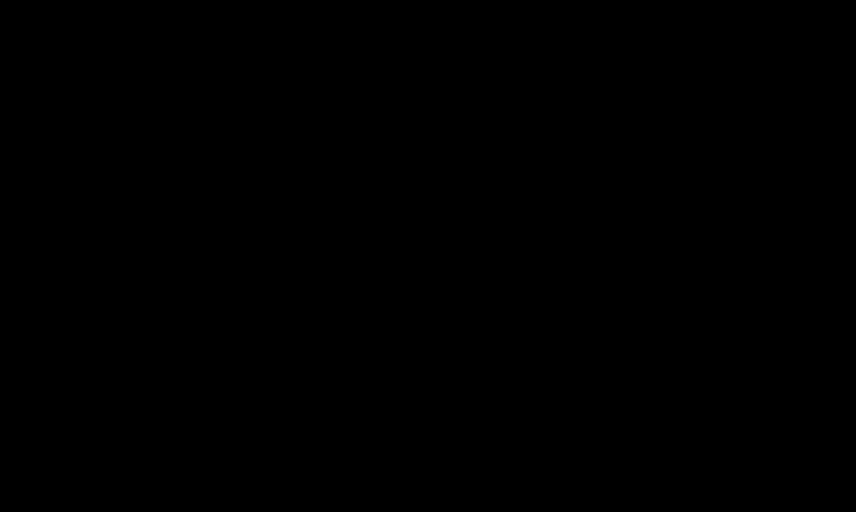
[im 73/133  bone]
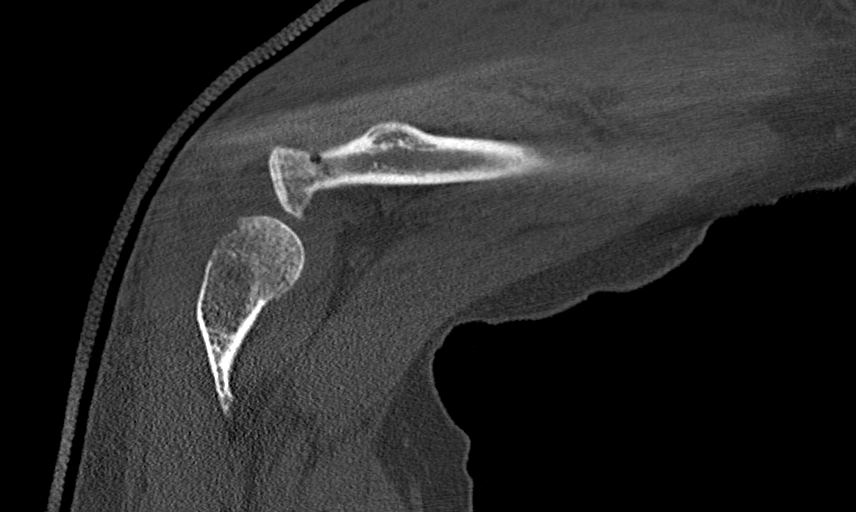

[Series 17: sag 1.5 mm st · sagittal · 0.29mm/px · 5 of 134 slices shown, 6 images]
[im 45/134  bone]
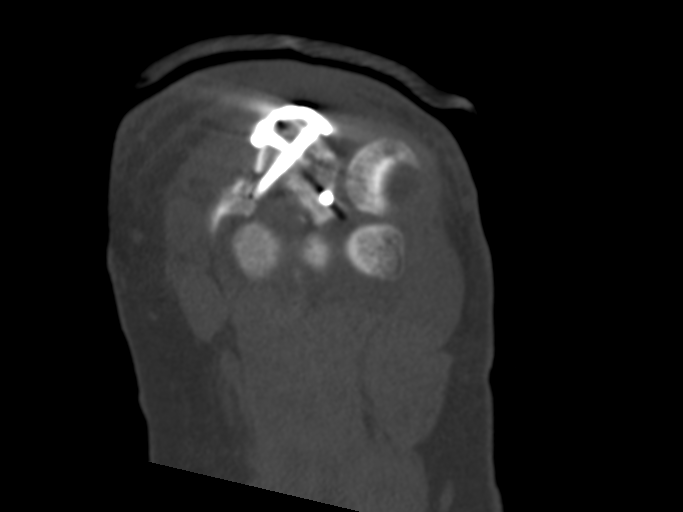
[im 56/134  bone]
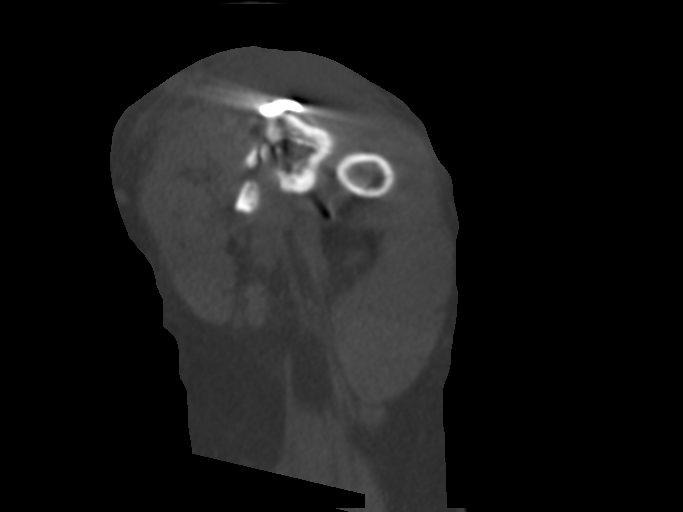
[im 67/134  soft-tissue]
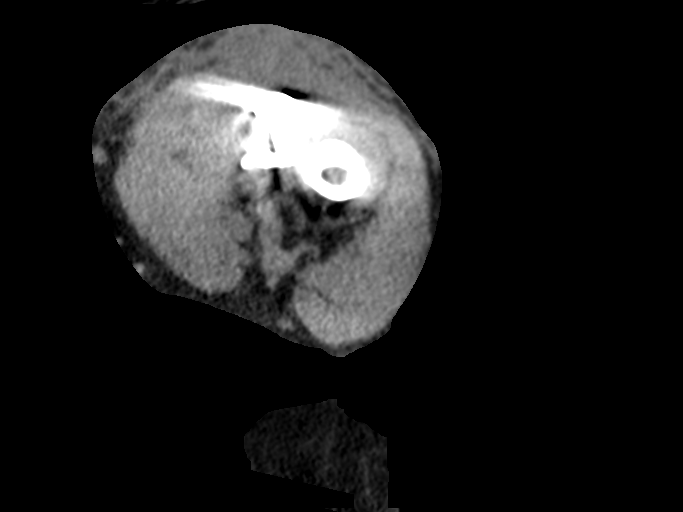
[im 67/134  bone]
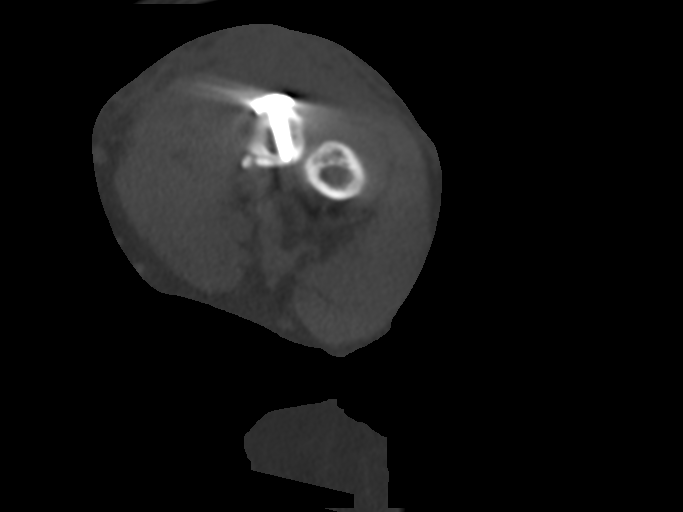
[im 78/134  bone]
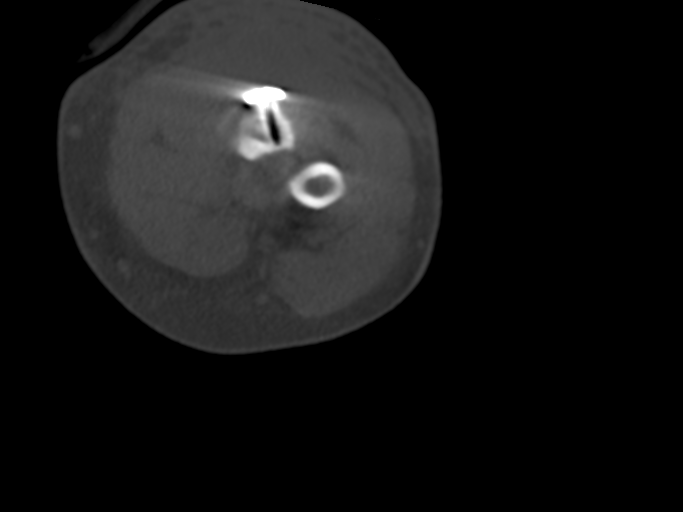
[im 89/134  bone]
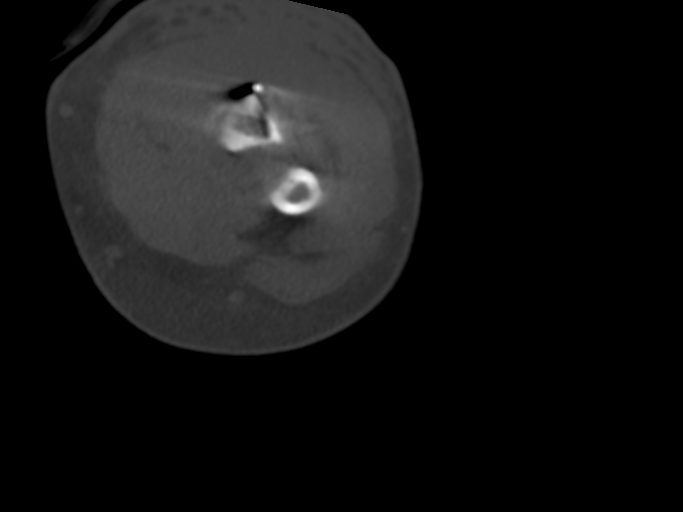

[12 of 33 positions shown; findings below may reference images not displayed]

FINDINGS: Bones/Joint/Cartilage

The scan demonstrates posterior dislocation of the radial head and
posterior subluxation of ulna with respect of the distal humerus.
The more medial aspect of the olecranon process articulates normally
with the trochlea. The coronoid process of the ulna is severely
fragmented and displaced. There are no fractures of the distal
humerus or of the proximal radius.

Plate and multiple screws are present in the proximal ulna. The
majority of the fragments are in near anatomic alignment and
position. One screw tip extends into the proximal radioulnar joint.

Ligaments

The ligaments are not adequately seen for assessment.

Muscles and Tendons

No discrete abnormality. However, the distal biceps tendon is not
identified. Does the patient have clinical evidence of distal biceps
tendon rupture?

Soft tissues

Edema in the soft tissues at the posterior aspect of the elbow and
forearm.
IMPRESSION: 1. Posterior dislocation of the radial head and posterior
subluxation of the ulna with respect of the distal humerus.
2. Severely fragmented and displaced coronoid process of the ulna.
3. The distal biceps tendon is not identified. Does the patient have
clinical evidence of distal biceps tendon rupture.?

## 2021-04-09 MED ORDER — DICLOFENAC SODIUM 75 MG PO TBEC: 75.0000 mg | DELAYED_RELEASE_TABLET | Freq: Two times a day (BID) | ORAL | 3 refills | Status: DC | PRN

## 2021-04-09 NOTE — Telephone Encounter (Signed)
Left knee MRI shows swelling/edema in the tibia bone which could be related to a trauma or stress reaction, or could possibly be from gout.  Right knee MRI shows a medial meniscus tear.

## 2021-04-09 NOTE — Addendum Note (Signed)
Addended by: Lillia Carmel on: 04/09/2021 09:35 AM   Modules accepted: Orders

## 2021-04-17 NOTE — Progress Notes (Deleted)
Office Visit Note  Patient: Joseph [name]             Date of Birth: Feb 22, 1981           MRN: [id number]             PCP: Eunice Blase, MD Referring: Eunice Blase, MD Visit Date: 05/01/2021 Occupation: _0 @  Subjective:  No chief complaint on file.   History of Present Illness: Joseph [name] is a 40 y.o. male ***   Activities of Daily Living:  Patient reports morning stiffness for *** {minute/hour:19697}.   Patient {ACTIONS;DENIES/REPORTS:21021675::"Denies"} nocturnal pain.  Difficulty dressing/grooming: {ACTIONS;DENIES/REPORTS:21021675::"Denies"} Difficulty climbing stairs: {ACTIONS;DENIES/REPORTS:21021675::"Denies"} Difficulty getting out of chair: {ACTIONS;DENIES/REPORTS:21021675::"Denies"} Difficulty using hands for taps, buttons, cutlery, and/or writing: {ACTIONS;DENIES/REPORTS:21021675::"Denies"}  No Rheumatology ROS completed.   PMFS History:  Patient Active Problem List   Diagnosis Date Noted  . Elbow dislocation, right, initial encounter 03/06/2020  . Closed disp fx of right olecranon with intraartic exten w malunion 02/14/2020  . Morbid obesity with BMI of 45.0-49.9, adult ([place]) 11/25/2019  . Hair loss 12/31/2017  . Varicose veins of both lower extremities 07/26/2017  . Iron deficiency anemia 09/21/2014  . Psoriatic arthritis ([place]) 09/21/2014  . ESSENTIAL HYPERTENSION, MALIGNANT 02/18/2009  . Nonspecific (abnormal) findings on radiological and other examination of body structure 02/16/2009  . CHEST XRAY, ABNORMAL 02/16/2009    Past Medical History:  Diagnosis Date  . Acute sinus infection 11/07/2014  . Arthritis   . Blood transfusion without reported diagnosis    At [facility]   . Difficult intubation 01/13/2020   difficult airway in OR (per Dr Laurey Arrow)  . Iron deficiency anemia 09/21/2014  . Psoriasis   . Psoriatic arthritis ([place])   . Psoriatic arthritis ([place])   . 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 Percival Spanish 02/2009 with echo, as needed f/u    Family History  Problem Relation Age of Onset  . Pulmonary fibrosis Mother   . Hypertension Mother   . Pancreatic cancer Maternal Grandmother   . Rheum arthritis Paternal Aunt   . Cancer Paternal Grandmother   . Breast cancer Paternal Grandmother   . COPD Paternal Grandfather   . CAD Paternal Grandfather   . Colon cancer Neg Hx   . Prostate cancer Neg Hx   . Diabetes Neg Hx   . Thyroid disease Neg Hx   . Liver cancer Neg Hx   . Stomach cancer Neg Hx   . Rectal cancer Neg Hx    Past Surgical History:  Procedure Laterality Date  . COLONOSCOPY WITH ESOPHAGOGASTRODUODENOSCOPY (EGD)    . EXTERNAL FIXATION REMOVAL Right 03/22/2020   Procedure: REMOVAL EXTERNAL FIXATION ARM;  Surgeon: Shona Needles, MD;  Location: [facility];  Service: Orthopedics;  Laterality: Right;  . HERNIA REPAIR  1994   umbicial  . 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 LIGAMENT REPAIR Right 01/13/2020   Procedure: COLLATERAL LIGAMENT REPAIRS;  Surgeon: Milly Jakob, MD;  Location: [place];  Service: Orthopedics;  Laterality: Right;  . ORIF ELBOW FRACTURE Right 02/17/2020   Procedure: REVISION OPEN REDUCTION INTERNAL FIXATION (ORIF) ELBOW/OLECRANON FRACTURE;  Surgeon: Shona Needles, MD;  Location: [place];  Service: Orthopedics;  Laterality: Right;  . ORIF ULNAR FRACTURE Right 01/13/2020   Procedure: OPEN TREATMENT OF COMPLEX RIGHT PROXIMAL ULNA FRACTURE;  Surgeon: Milly Jakob, MD;  Location: [place];  Service: Orthopedics;  Laterality: Right;  PROCEDURE: OPEN TREATMENT OF COMPLEX RIGHT PROXIMAL ULNA FRACTURE WITH POSSIBLE COLLATERAL LIGAMENT REPAIRS LENGTH OF SURGERY: 2.5 [place] + REGIONAL BLOCK   Social History   Social History Narrative   Right handed   Caffeien use:1 soda per day   Immunization History   Administered Date(s) Administered  . MMR 02/12/2001  . PFIZER Comirnaty([place])Covid-19 Tri-Sucrose Vaccine 09/21/2020, 10/12/2020  . Pneumococcal Polysaccharide-23 11/10/2012  . Td 08/17/2001     Objective: Vital Signs: There were no vitals taken for this visit.   Physical Exam   Musculoskeletal Exam: ***  CDAI Exam: CDAI Score: -- Patient Global: --; Provider Global: -- Swollen: --; Tender: -- Joint Exam 05/01/2021   No joint exam has been documented for this visit   There is currently no information documented on the homunculus. Go to the Rheumatology activity and complete the homunculus joint exam.  Investigation: No additional findings.  Imaging: MR Knee Right w/o contrast  Result Date: 04/07/2021 CLINICAL DATA:  Bilateral knee pain started 2 months ago. Pain around the patella. EXAM: MRI OF THE RIGHT KNEE WITHOUT CONTRAST TECHNIQUE: Multiplanar, multisequence MR imaging of the knee was performed. No intravenous contrast was administered. COMPARISON:  None. FINDINGS: MENISCI Medial: Vertical tear of the posterior periphery of the posterior horn of the medial meniscus. Lateral: Intact.  Incidental note made of an meniscal flounce. LIGAMENTS Cruciates: ACL and PCL are intact. Collaterals: Medial collateral ligament is intact. Lateral collateral ligament complex is intact. CARTILAGE Patellofemoral:  No chondral defect. Medial:  Mild chondral thinning without a focal chondral defect. Lateral:  No chondral defect. JOINT: No joint effusion. Normal Hoffa's fat-pad. No plical thickening. POPLITEAL FOSSA: Popliteus tendon is intact. No Baker's cyst. EXTENSOR MECHANISM: Intact quadriceps tendon. Intact patellar tendon. Intact lateral patellar retinaculum. Intact medial patellar retinaculum. Intact MPFL. BONES: No aggressive osseous lesion. No fracture or dislocation. Other: No fluid collection or hematoma. Muscles are normal. Numerous varicosities in the subcutaneous fat. IMPRESSION: 1.  Vertical tear of the posterior periphery of the posterior horn of the medial meniscus. 2.  No acute osseous injury of the right knee. Electronically Signed   By: Kathreen Devoid   On: 04/07/2021 12:22   MR Knee Left w/o contrast  Result Date: 04/07/2021 CLINICAL DATA:  Diffuse bilateral knee pain. Pain around the patella. EXAM: MRI OF THE LEFT KNEE WITHOUT CONTRAST TECHNIQUE: Multiplanar, multisequence MR imaging of the knee was performed. No intravenous contrast was administered. COMPARISON:  None. FINDINGS: MENISCI Medial: Intact. Lateral: Intact. LIGAMENTS Cruciates: ACL and PCL are intact. Collaterals: Medial collateral ligament is intact. Lateral collateral ligament complex is intact. CARTILAGE Patellofemoral:  No chondral defect. Medial:  Chondral thinning of the medial femorotibial compartment. Lateral:  No chondral defect. JOINT: Moderate joint effusion. Normal Hoffa's fat-pad. No plical thickening. POPLITEAL FOSSA: Popliteus tendon is intact. No Baker's cyst. EXTENSOR MECHANISM: Intact quadriceps tendon. Intact patellar tendon. Intact lateral patellar retinaculum. Intact medial patellar retinaculum. Intact MPFL. TT-TG distance 18 mm. BONES: No aggressive osseous lesion. No fracture or dislocation. Bone marrow edema in the posterolateral corner of the proximal medial tibial metaphysis. Other: No fluid collection or hematoma. Muscles are normal. Numerous varicosities in the subcutaneous fat. IMPRESSION: 1. No meniscal or ligamentous injury of the left knee. 2. Bone marrow edema in the posterolateral corner of the proximal medial tibial metaphysis. This may reflect an osseous contusion secondary to direct trauma versus reactive marrow changes  from an underlying bone lesion or atypical presentation of crystalline arthropathy such as gout. 3. Electronically Signed   By: Kathreen Devoid   On: 04/07/2021 12:26    Recent Labs: Lab Results  Component Value Date   WBC 10.4 12/21/2020   HGB 14.0 12/21/2020   PLT  214.0 12/21/2020   NA 136 12/21/2020   K 3.7 12/21/2020   CL 99 12/21/2020   CO2 27 12/21/2020   GLUCOSE 87 12/21/2020   BUN 10 12/21/2020   CREATININE 0.96 12/21/2020   BILITOT 0.8 12/21/2020   ALKPHOS 108 12/21/2020   AST 13 12/21/2020   ALT 15 12/21/2020   PROT 8.3 12/21/2020   ALBUMIN 4.0 12/21/2020   CALCIUM 8.9 12/21/2020   GFRAA 112 11/28/2020   QFTBGOLD Negative 04/03/2017   QFTBGOLDPLUS NEGATIVE 11/28/2020    Speciality Comments: Prior therapy: Enbrel/Humira (inadequate response)  and Remicaide (d/c due to cost and time consuming infusion)  Procedures:  No procedures performed Allergies: Sulfonamide derivatives and Bee venom   Assessment / Plan:     Visit Diagnoses: No diagnosis found.  Orders: No orders of the defined types were placed in this encounter.  No orders of the defined types were placed in this encounter.   Face-to-face time spent with patient was *** minutes. Greater than 50% of time was spent in counseling and coordination of care.  Follow-Up Instructions: No follow-ups on file.   Earnestine Mealing, CMA  Note - This record has been created using Editor, commissioning.  Chart creation errors have been sought, but may not always  have been located. Such creation errors do not reflect on  the standard of medical care.

## 2021-04-18 ENCOUNTER — Other Ambulatory Visit: Payer: Self-pay | Admitting: *Deleted

## 2021-04-18 DIAGNOSIS — L409 Psoriasis, unspecified: Secondary | ICD-10-CM

## 2021-04-18 DIAGNOSIS — L4050 Arthropathic psoriasis, unspecified: Secondary | ICD-10-CM

## 2021-04-18 MED ORDER — COSENTYX SENSOREADY (300 MG) 150 MG/ML ~~LOC~~ SOAJ: SUBCUTANEOUS | 0 refills | Status: DC

## 2021-04-18 NOTE — Telephone Encounter (Signed)
Next Visit: 05/01/2021  Last Visit: 11/28/2020  Last Fill: 03/22/2021  DX: Psoriatic arthritis  Current Dose per office note 11/28/2020: Cosentyx 150 mg sq injections once every 14 days  Labs: 12/21/2020, RDW 17.4 I called patient, patient will have labs drawn 04/19/2021 in office  TB Gold: 11/28/2020, negative   Okay to refill Cosentyx?

## 2021-04-19 ENCOUNTER — Other Ambulatory Visit: Payer: Self-pay

## 2021-04-19 DIAGNOSIS — Z79899 Other long term (current) drug therapy: Secondary | ICD-10-CM | POA: Diagnosis not present

## 2021-04-20 LAB — COMPLETE METABOLIC PANEL WITH GFR
AG Ratio: 1.1 (calc) (ref 1.0–2.5)
ALT: 18 U/L (ref 9–46)
AST: 16 U/L (ref 10–40)
Albumin: 3.9 g/dL (ref 3.6–5.1)
Alkaline phosphatase (APISO): 100 U/L (ref 36–130)
BUN: 14 mg/dL (ref 7–25)
CO2: 25 mmol/L (ref 20–32)
Calcium: 8.9 mg/dL (ref 8.6–10.3)
Chloride: 101 mmol/L (ref 98–110)
Creat: 0.99 mg/dL (ref 0.60–1.35)
GFR, Est African American: 112 mL/min/{1.73_m2} (ref >=60)
GFR, Est Non African American: 97 mL/min/{1.73_m2} (ref >=60)
Globulin: 3.7 g/dL (calc) (ref 1.9–3.7)
Glucose, Bld: 84 mg/dL (ref 65–99)
Potassium: 4.3 mmol/L (ref 3.5–5.3)
Sodium: 137 mmol/L (ref 135–146)
Total Bilirubin: 0.5 mg/dL (ref 0.2–1.2)
Total Protein: 7.6 g/dL (ref 6.1–8.1)

## 2021-04-20 LAB — CBC WITH DIFFERENTIAL/PLATELET
Absolute Monocytes: 812 cells/uL (ref 200–950)
Basophils Absolute: 69 cells/uL (ref 0–200)
Basophils Relative: 0.7 %
Eosinophils Absolute: 257 cells/uL (ref 15–500)
Eosinophils Relative: 2.6 %
HCT: 44.5 % (ref 38.5–50.0)
Hemoglobin: 13.7 g/dL (ref 13.2–17.1)
Lymphs Abs: 2416 cells/uL (ref 850–3900)
MCH: 25.3 pg — ABNORMAL LOW (ref 27.0–33.0)
MCHC: 30.8 g/dL — ABNORMAL LOW (ref 32.0–36.0)
MCV: 82.3 fL (ref 80.0–100.0)
MPV: 10.6 fL (ref 7.5–12.5)
Monocytes Relative: 8.2 %
Neutro Abs: 6346 cells/uL (ref 1500–7800)
Neutrophils Relative %: 64.1 %
Platelets: 264 10*3/uL (ref 140–400)
RBC: 5.41 10*6/uL (ref 4.20–5.80)
RDW: 15.1 % — ABNORMAL HIGH (ref 11.0–15.0)
Total Lymphocyte: 24.4 %
WBC: 9.9 10*3/uL (ref 3.8–10.8)

## 2021-04-22 ENCOUNTER — Encounter: Payer: Self-pay | Admitting: Family Medicine

## 2021-04-22 MED ORDER — NABUMETONE 500 MG PO TABS: 500.0000 mg | ORAL_TABLET | Freq: Two times a day (BID) | ORAL | 3 refills | Status: DC | PRN

## 2021-04-22 NOTE — Progress Notes (Signed)
CMP WNL. CBC stable.

## 2021-04-22 NOTE — Progress Notes (Signed)
Office Visit Note  Patient: Joseph [name]             Date of Birth: 1981/08/04           MRN: [id number]             PCP: Eunice Blase, MD Referring: Eunice Blase, MD Visit Date: 04/23/2021 Occupation: _0 @  Subjective:  Pain in both hands  History of Present Illness: Joseph [name] is a 40 y.o. male with history of psoriatic arthritis and osteoarthritis.  Patient is on Cosentyx 150 mg sq injections every 14 days.  He has not missed any doses of Cosentyx recently.  The patient's wife was on speaker phone during the office visit today and provided some of the history.  According to the patient he has been having severe pain and swelling in both hands.  [name] having difficulty making a complete fist.  His joint stiffness has been lasting into the afternoon.  He has been having difficulty performing ADLs due to severity of pain and swelling.  Patient reports that he is also having severe pain in both knee joints.  He had MRIs of both knees on 04/06/2021.  He has a torn meniscus in the right knee and will be seeing Dr. Marlou Sa next week to discuss proceeding with surgery.  He was recently switched from diclofenac tablets to nabumetone 500 mg twice daily as needed for pain relief.  He continues to have significant pain at night and difficulty getting out of bed in the morning due to severity of pain and stiffness.  He denies any Achilles tendinitis or plantar fasciitis.  He denies any SI joint discomfort at this time.  He denies any active psoriasis. He denies any recent infections.       Activities of Daily Living:  Patient reports morning stiffness for 2-3 hours.   Patient Reports nocturnal pain.  Difficulty dressing/grooming: Denies Difficulty climbing stairs: Reports Difficulty getting out of chair: Reports Difficulty using hands for taps, buttons, cutlery, and/or writing: Reports  Review of Systems  Constitutional:  Negative for fatigue.  HENT:  Negative for mouth sores,  mouth dryness and nose dryness.   Eyes:  Negative for pain, itching and dryness.  Respiratory:  Negative for shortness of breath and difficulty breathing.   Cardiovascular:  Negative for chest pain and palpitations.  Gastrointestinal:  Negative for blood in stool, constipation and diarrhea.  Endocrine: Negative for increased urination.  Genitourinary:  Negative for difficulty urinating.  Musculoskeletal:  Positive for joint pain, joint pain, joint swelling and morning stiffness. Negative for myalgias, muscle tenderness and myalgias.  Skin:  Negative for color change, rash and redness.  Allergic/Immunologic: Negative for susceptible to infections.  Neurological:  Negative for dizziness, numbness, headaches, memory loss and weakness.  Hematological:  Negative for bruising/bleeding tendency.  Psychiatric/Behavioral:  Negative for confusion.    PMFS History:  Patient Active Problem List   Diagnosis Date Noted   Elbow dislocation, right, initial encounter 03/06/2020   Closed disp fx of right olecranon with intraartic exten w malunion 02/14/2020   Morbid obesity with BMI of 45.0-49.9, adult ([place]) 11/25/2019   Hair loss 12/31/2017   Varicose veins of both lower extremities 07/26/2017   Iron deficiency anemia 09/21/2014   Psoriatic arthritis ([place]) 09/21/2014   ESSENTIAL HYPERTENSION, MALIGNANT 02/18/2009   Nonspecific (abnormal) findings on radiological and other examination of body structure 02/16/2009   CHEST XRAY, ABNORMAL 02/16/2009    Past Medical History:  Diagnosis Date  Acute sinus infection 11/07/2014   Arthritis    Blood transfusion without reported diagnosis    At [facility]    Difficult intubation 01/13/2020   difficult airway in OR (per Dr Laurey Arrow)   Iron deficiency anemia 09/21/2014   Psoriasis    Psoriatic arthritis ([place])    Psoriatic arthritis ([place])    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 Percival Spanish 02/2009 with echo, as needed f/u   Torn meniscus    right knee    Family History  Problem Relation Age of Onset   Pulmonary fibrosis Mother    Hypertension Mother    Pancreatic cancer Maternal Grandmother    Rheum arthritis Paternal Aunt    Cancer Paternal Grandmother    Breast cancer Paternal Grandmother    COPD Paternal Grandfather    CAD Paternal Grandfather    Colon cancer Neg Hx    Prostate cancer Neg Hx    Diabetes Neg Hx    Thyroid disease Neg Hx    Liver cancer Neg Hx    Stomach cancer Neg Hx    Rectal cancer Neg Hx    Past Surgical History:  Procedure Laterality Date   COLONOSCOPY WITH ESOPHAGOGASTRODUODENOSCOPY (EGD)     EXTERNAL FIXATION REMOVAL Right 03/22/2020   Procedure: REMOVAL EXTERNAL FIXATION ARM;  Surgeon: Shona Needles, MD;  Location: [place];  Service: Orthopedics;  Laterality: Right;   HERNIA REPAIR  1994   umbicial   INNER EAR SURGERY Left    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, 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   LIGAMENT REPAIR Right 01/13/2020   Procedure: COLLATERAL LIGAMENT REPAIRS;  Surgeon: Milly Jakob, MD;  Location: [place];  Service: Orthopedics;  Laterality: Right;   ORIF ELBOW FRACTURE Right 02/17/2020   Procedure: REVISION OPEN REDUCTION INTERNAL FIXATION (ORIF) ELBOW/OLECRANON FRACTURE;  Surgeon: Shona Needles, MD;  Location: [place];  Service: Orthopedics;  Laterality: Right;   ORIF ULNAR FRACTURE Right 01/13/2020   Procedure: OPEN TREATMENT OF COMPLEX RIGHT PROXIMAL ULNA FRACTURE;  Surgeon: Milly Jakob, MD;  Location: [place];  Service: Orthopedics;  Laterality: Right;  PROCEDURE: OPEN TREATMENT OF COMPLEX RIGHT PROXIMAL ULNA FRACTURE WITH POSSIBLE COLLATERAL LIGAMENT REPAIRS LENGTH OF SURGERY: 2.5 [place] + REGIONAL BLOCK   Social History   Social History Narrative   Right handed   Caffeien use:1 soda per day   Immunization History  Administered Date(s) Administered   MMR  02/12/2001   PFIZER Comirnaty([place])Covid-19 Tri-Sucrose Vaccine 09/21/2020, 10/12/2020   Pneumococcal Polysaccharide-23 11/10/2012   Td 08/17/2001     Objective: Vital Signs: BP (!) 151/104 (BP Location: Right Arm, Patient Position: Sitting, Cuff Size: Large)   Pulse 86   Resp 18   Ht 5' 10" (1.778 m)   Wt (!) 324 lb 6.4 oz (147.1 kg)   BMI 46.55 kg/m    Physical Exam Vitals and nursing note reviewed.  Constitutional:      Appearance: [name] well-developed.  HENT:     Head: Normocephalic and atraumatic.  Eyes:     Conjunctiva/sclera: Conjunctivae normal.     Pupils: Pupils are equal, round, and reactive to light.  Pulmonary:     Effort: Pulmonary effort is normal.  Abdominal:     Palpations: Abdomen is soft.  Musculoskeletal:     Cervical back: Normal range of motion and neck supple.  Skin:  General: Skin is warm and dry.     Capillary Refill: Capillary refill takes less than 2 seconds.  Neurological:     Mental Status: [name] alert and oriented to person, place, and time.  Psychiatric:        Behavior: Behavior normal.     Musculoskeletal Exam: C-spine, thoracic spine, and lumbar spine good ROM.  Shoulder joints good ROM with no discomfort.  Right elbow joint flexion contracture.  Dactylitis of right 3rd digit and left thumb.  Incomplete right fist formation.  Painful ROM of both knee joints.  Warmth of the right knee noted.  Ankle joints have good ROM with no tenderness or joint swelling.  No evidence of achilles tendonitis or plantar fasciitis.  No tenderness or synovitis over MTP or PIP joints.   CDAI Exam: CDAI Score: -- Patient Global: --; Provider Global: -- Swollen: 5 ; Tender: 5  Joint Exam 04/23/2021      Right  Left  MCP 1     Swollen Tender  MCP 3  Swollen Tender     IP     Swollen Tender  PIP 3  Swollen Tender     DIP 3  Swollen Tender        Investigation: No additional findings.  Imaging: MR Knee Right w/o contrast  Result Date:  04/07/2021 CLINICAL DATA:  Bilateral knee pain started 2 months ago. Pain around the patella. EXAM: MRI OF THE RIGHT KNEE WITHOUT CONTRAST TECHNIQUE: Multiplanar, multisequence MR imaging of the knee was performed. No intravenous contrast was administered. COMPARISON:  None. FINDINGS: MENISCI Medial: Vertical tear of the posterior periphery of the posterior horn of the medial meniscus. Lateral: Intact.  Incidental note made of an meniscal flounce. LIGAMENTS Cruciates: ACL and PCL are intact. Collaterals: Medial collateral ligament is intact. Lateral collateral ligament complex is intact. CARTILAGE Patellofemoral:  No chondral defect. Medial:  Mild chondral thinning without a focal chondral defect. Lateral:  No chondral defect. JOINT: No joint effusion. Normal Hoffa's fat-pad. No plical thickening. POPLITEAL FOSSA: Popliteus tendon is intact. No Baker's cyst. EXTENSOR MECHANISM: Intact quadriceps tendon. Intact patellar tendon. Intact lateral patellar retinaculum. Intact medial patellar retinaculum. Intact MPFL. BONES: No aggressive osseous lesion. No fracture or dislocation. Other: No fluid collection or hematoma. Muscles are normal. Numerous varicosities in the subcutaneous fat. IMPRESSION: 1. Vertical tear of the posterior periphery of the posterior horn of the medial meniscus. 2.  No acute osseous injury of the right knee. Electronically Signed   By: Kathreen Devoid   On: 04/07/2021 12:22   MR Knee Left w/o contrast  Result Date: 04/07/2021 CLINICAL DATA:  Diffuse bilateral knee pain. Pain around the patella. EXAM: MRI OF THE LEFT KNEE WITHOUT CONTRAST TECHNIQUE: Multiplanar, multisequence MR imaging of the knee was performed. No intravenous contrast was administered. COMPARISON:  None. FINDINGS: MENISCI Medial: Intact. Lateral: Intact. LIGAMENTS Cruciates: ACL and PCL are intact. Collaterals: Medial collateral ligament is intact. Lateral collateral ligament complex is intact. CARTILAGE Patellofemoral:  No  chondral defect. Medial:  Chondral thinning of the medial femorotibial compartment. Lateral:  No chondral defect. JOINT: Moderate joint effusion. Normal Hoffa's fat-pad. No plical thickening. POPLITEAL FOSSA: Popliteus tendon is intact. No Baker's cyst. EXTENSOR MECHANISM: Intact quadriceps tendon. Intact patellar tendon. Intact lateral patellar retinaculum. Intact medial patellar retinaculum. Intact MPFL. TT-TG distance 18 mm. BONES: No aggressive osseous lesion. No fracture or dislocation. Bone marrow edema in the posterolateral corner of the proximal medial tibial metaphysis. Other: No fluid collection or hematoma.  Muscles are normal. Numerous varicosities in the subcutaneous fat. IMPRESSION: 1. No meniscal or ligamentous injury of the left knee. 2. Bone marrow edema in the posterolateral corner of the proximal medial tibial metaphysis. This may reflect an osseous contusion secondary to direct trauma versus reactive marrow changes from an underlying bone lesion or atypical presentation of crystalline arthropathy such as gout. 3. Electronically Signed   By: Kathreen Devoid   On: 04/07/2021 12:26    Recent Labs: Lab Results  Component Value Date   WBC 9.9 04/19/2021   HGB 13.7 04/19/2021   PLT 264 04/19/2021   NA 137 04/19/2021   K 4.3 04/19/2021   CL 101 04/19/2021   CO2 25 04/19/2021   GLUCOSE 84 04/19/2021   BUN 14 04/19/2021   CREATININE 0.99 04/19/2021   BILITOT 0.5 04/19/2021   ALKPHOS 108 12/21/2020   AST 16 04/19/2021   ALT 18 04/19/2021   PROT 7.6 04/19/2021   ALBUMIN 4.0 12/21/2020   CALCIUM 8.9 04/19/2021   GFRAA 112 04/19/2021   QFTBGOLD Negative 04/03/2017   QFTBGOLDPLUS NEGATIVE 11/28/2020    Speciality Comments: Prior therapy: Enbrel/Humira (inadequate response)  and Remicaide (d/c due to cost and time consuming infusion)  Procedures:  No procedures performed Allergies: Sulfonamide derivatives and Bee venom   Assessment / Plan:     Visit Diagnoses: Psoriatic arthritis  ([place]) - He presents today with tenderness dactylitis of the right 3rd digit and left thumb.  He has been experiencing recurrent flares in both hands over the past several months.  He has had difficulty making a complete fist due to severity of pain and stiffness.  His overall joint stiffness has been lasting into the afternoon.  He has also been experiencing increased nocturnal pain.  X-rays of both hands and feet were updated today to assess for radiographic progression.   He currently is on Cosentyx 150 mg subcutaneous injections every 14 days.  He has not missed any doses of Cosentyx recently.  He was recently switched from oral diclofenac to Nabumetone 500 mg 1 tablet twice daily as needed for pain relief prescribed by Dr. Junius Roads. He tried taking nabumetone yesterday, but he did not notice any improvement in his symptoms yet.  He has been following up closely with Dr. Junius Roads for chronic pain in both knee joints.  He had MRIs of both knees on 04/06/2021-results were reviewed today.  He has an upcoming appointment with Dr. Marlou Sa on 05/01/21 to discuss proceeding with arthroscopic surgery.  He has warmth and painful ROM in the right knee on exam today.  Different treatment options were discussed today in detail.He previously had an inadequate response to enbrel, humira, remicade and now cosentyx.  Indications, contraindications, potential side effects of Taltz were discussed today in detail.  All questions were addressed and consent was obtained.  We will apply for Taltz through his insurance and once approved he will return to the office for administration of the first injection.  A prednisone taper starting at 20 mg tapering by 5 mg every 4 days was sent to the pharmacy.  He was advised to take prednisone in the morning with food and to avoid all NSAIDs while taking the taper. We will apply for Taltz through his insurance.  He will follow up in 6-8 weeks to assess his response to taltz.  Plan: XR Hand 2 View Right,  XR Hand 2 View Left, XR Foot 2 Views Right, XR Foot 2 Views Left  Medication counseling: Baseline Immunosuppressant Therapy  Labs TB GOLD Quantiferon TB Gold Latest Ref Rng & Units 11/28/2020  Quantiferon TB Gold Plus NEGATIVE NEGATIVE   Hepatitis Panel   HIV Lab Results  Component Value Date   HIV NON-REACTIVE 07/23/2018   Immunoglobulins Immunoglobulin Electrophoresis Latest Ref Rng & Units 12/21/2020  IgA  47 - 310 mg/dL 655(H)  IgG 600 - 1,640 mg/dL -  IgM 50 - 300 mg/dL -   SPEP Serum Protein Electrophoresis Latest Ref Rng & Units 04/19/2021  Total Protein 6.1 - 8.1 g/dL 7.6  Albumin 3.8 - 4.8 g/dL -  Alpha-1 0.2 - 0.3 g/dL -  Alpha-2 0.5 - 0.9 g/dL -  Beta Globulin 0.4 - 0.6 g/dL -  Beta 2 0.2 - 0.5 g/dL -  Gamma Globulin 0.8 - 1.7 g/dL -   Chest Xray: 02/16/09 Does patient have a history of inflammatory bowel disease? No  Counseled patient that Donnetta Hail is a IL-17 inhibitor that works to reduce pain and inflammation associated with arthritis.  Counseled patient on purpose, proper use, and adverse effects of Taltz. Reviewed the most common adverse effects of infection, inflammatory bowel disease, and allergic reaction. Counseled patient that Donnetta Hail should be held for infection and prior to scheduled surgery.  Counseled patient to avoid live vaccines while on Taltz.  Advised patient to get annual influenza vaccine, pneumococcal vaccine, and Shingrix as indicated.  Reviewed storage information for Taltz.  Reviewed the importance of regular labs while on [place]. Standing orders placed and is to return in 1 month and then every 3 months after initiation.  Provided patient with medication education material and answered all questions.  Patient consented to [place].  Will upload consent into patient's chart.  Will apply for Taltz through patient's insurance and update when we receive a response.  Advised initial injection must be administered in office.  Patient voiced understanding.     Taltz dose will be: For psoriatic arthritis and plaque psoriasis overlap load of 160 mg then 80 mg on weeks 2,4,6,8,10,12 then 80 mg every 28 days  Prescription will be sent to pharmacy pending lab results and insurance approval.  Psoriasis: He has no active psoriasis at this time.   High risk medication use -Applying for Taltz-loading dose followed by maintenance dose of 80 mg every 28 days as discussed above. Inadequate response to Cosentyx 150 mg sq injections once every 14 days.  Previously had inadequate response to Enbrel, Humira, and Remicade.  CBC and CMP were updated on 04/19/2021.  His next lab work will be due in September and every 3 months to monitor for drug toxicity.  Standing orders for CBC and CMP are in place.  TB Gold negative on 11/28/2020. He has not had any recent infections.   Discussed the importance of holding taltz if he develops signs or symptoms of an infection and to resume once the infection has completely cleared.  Primary osteoarthritis of both hands: He has been experiencing increased pain and stiffness in both hands.  He has difficulty making a complete fist on examination today due to severity of pain and inflammation.  Dactylitis of the right third digit and left thumb were noted on examination today.  Tendinopathy of right shoulder - Resolved.  Primary osteoarthritis of left knee - Moderate OA and mild chondromalacia patella evident on x-rays from 08/31/20.  He had MRI of the left knee on 04/06/2021-ordered by Dr. Junius Roads. Findings: Bone marrow edema in the posterolateral corner of the proximal medial tibial metaphysis. This may reflect an osseous contusion  secondary to direct trauma versus reactive marrow changes from an underlying bone lesion or atypical presentation of crystalline arthropathy such as gout. His uric acid was 6.8 on 03/29/2021. He has a follow-up appointment scheduled with Dr. Marlou Sa on 05/01/2021 for further evaluation and management.    History  of torn meniscus of right knee: MRI of the right knee was obtained on 04/06/2021.  Results reviewed today in the office.  He was given a prescription for nabumetone 500 mg 1 tablet twice daily as needed for pain relief prescribed by Dr. Junius Roads.  He was advised to hold nabumetone while taking the prednisone taper prescribed today.   He has an appointment scheduled with Dr. Marlou Sa on 05/01/21 to discuss proceeding with surgery.   Chondromalacia patellae of right knee - X-rays of right knee on 08/31/20 revealed mild chondromalacia patella.   Left hand paresthesia - Referred to Dr. Lucia Gaskins after his last office visit.  NCV with EMG was performed on 12/21/2020 which revealed moderate left median nerve entrapment.  Closed fracture of proximal end of right ulna with routine healing, unspecified fracture morphology, subsequent encounter - A fracture of the right proximal ulna on 12/15/2019 and underwent internal fixation by Dr. Grandville Silos on 01/13/2020. He has completed physical therapy.  Persistent flexion contracture of right elbow noted.   Other medical conditions are listed as follows:   Pedal edema: Unchanged.    History of hypertension: Advised to monitor blood pressure closely at home.   History of iron deficiency anemia  Varicose veins of both lower extremities, unspecified whether complicated  Orders: Orders Placed This Encounter  Procedures   XR Hand 2 View Right   XR Hand 2 View Left   XR Foot 2 Views Right   XR Foot 2 Views Left   Meds ordered this encounter  Medications   predniSONE (DELTASONE) 5 MG tablet    Sig: Take 4 tablets by mouth daily x4 days, 3 tablets by mouth daily x4 days, 2 tablets by mouth daily x4 days, 1 tablet by mouth daily x4 days.    Dispense:  40 tablet    Refill:  0      Follow-Up Instructions: Return in about 3 months (around 07/24/2021) for Psoriatic arthritis, Osteoarthritis.   Ofilia Neas, PA-C  Note - This record has been created using Dragon software.   Chart creation errors have been sought, but may not always  have been located. Such creation errors do not reflect on  the standard of medical care.

## 2021-04-23 ENCOUNTER — Ambulatory Visit: Payer: Self-pay

## 2021-04-23 ENCOUNTER — Encounter: Payer: Self-pay | Admitting: Physician Assistant

## 2021-04-23 ENCOUNTER — Telehealth: Payer: Self-pay | Admitting: Pharmacist

## 2021-04-23 ENCOUNTER — Ambulatory Visit (INDEPENDENT_AMBULATORY_CARE_PROVIDER_SITE_OTHER): Payer: BC Managed Care – PPO | Admitting: Physician Assistant

## 2021-04-23 ENCOUNTER — Other Ambulatory Visit: Payer: Self-pay

## 2021-04-23 VITALS — BP 151/104 | HR 86 | Resp 18 | Ht 70.0 in | Wt 324.4 lb

## 2021-04-23 DIAGNOSIS — M1712 Unilateral primary osteoarthritis, left knee: Secondary | ICD-10-CM

## 2021-04-23 DIAGNOSIS — M79671 Pain in right foot: Secondary | ICD-10-CM | POA: Diagnosis not present

## 2021-04-23 DIAGNOSIS — L409 Psoriasis, unspecified: Secondary | ICD-10-CM

## 2021-04-23 DIAGNOSIS — Z8679 Personal history of other diseases of the circulatory system: Secondary | ICD-10-CM

## 2021-04-23 DIAGNOSIS — Z862 Personal history of diseases of the blood and blood-forming organs and certain disorders involving the immune mechanism: Secondary | ICD-10-CM

## 2021-04-23 DIAGNOSIS — M19042 Primary osteoarthritis, left hand: Secondary | ICD-10-CM | POA: Diagnosis not present

## 2021-04-23 DIAGNOSIS — L4050 Arthropathic psoriasis, unspecified: Secondary | ICD-10-CM | POA: Diagnosis not present

## 2021-04-23 DIAGNOSIS — M2241 Chondromalacia patellae, right knee: Secondary | ICD-10-CM

## 2021-04-23 DIAGNOSIS — M19041 Primary osteoarthritis, right hand: Secondary | ICD-10-CM

## 2021-04-23 DIAGNOSIS — Z79899 Other long term (current) drug therapy: Secondary | ICD-10-CM

## 2021-04-23 DIAGNOSIS — Z87828 Personal history of other (healed) physical injury and trauma: Secondary | ICD-10-CM

## 2021-04-23 DIAGNOSIS — M79672 Pain in left foot: Secondary | ICD-10-CM | POA: Diagnosis not present

## 2021-04-23 DIAGNOSIS — I8393 Asymptomatic varicose veins of bilateral lower extremities: Secondary | ICD-10-CM

## 2021-04-23 DIAGNOSIS — R600 Localized edema: Secondary | ICD-10-CM

## 2021-04-23 DIAGNOSIS — M67911 Unspecified disorder of synovium and tendon, right shoulder: Secondary | ICD-10-CM

## 2021-04-23 DIAGNOSIS — R202 Paresthesia of skin: Secondary | ICD-10-CM

## 2021-04-23 DIAGNOSIS — S52001D Unspecified fracture of upper end of right ulna, subsequent encounter for closed fracture with routine healing: Secondary | ICD-10-CM

## 2021-04-23 MED ORDER — PREDNISONE 5 MG PO TABS: ORAL_TABLET | ORAL | 0 refills | Status: DC

## 2021-04-23 NOTE — Patient Instructions (Addendum)
If you have signs or symptoms of an infection or start antibiotics: First, call your PCP for workup of your infection. Hold your medication through the infection, until you complete your antibiotics, and until symptoms resolve if you take the following: Injectable medication (Actemra, Benlysta, Cimzia, Cosentyx, Enbrel, Humira, Kevzara, Orencia, Remicade, Simponi, Stelara, Taltz, Tremfya)  Vaccines You are taking a medication(s) that can suppress your immune system.  The following immunizations are recommended: Flu annually Covid-19  Td/Tdap (tetanus, diphtheria, pertussis) every 10 years Pneumonia (Prevnar 15 then Pneumovax 23 at least 1 year apart.  Alternatively, can take Prevnar 20 without needing additional dose) Shingrix (after age 6): 2 doses from 4 weeks to 6 months apart  Please check with your PCP to make sure you are up to date.  Standing Labs We placed an order today for your standing lab work.   Please have your standing labs drawn in 1 month and then every 3 months   If possible, please have your labs drawn 2 weeks prior to your appointment so that the provider can discuss your results at your appointment.  Please note that you may see your imaging and lab results in MyChart before we have reviewed them. We may be awaiting multiple results to interpret others before contacting you. Please allow our office up to 72 hours to thoroughly review all of the results before contacting the office for clarification of your results.  We have open lab daily: Monday through Thursday from 1:30-4:30 PM and Friday from 1:30-4:00 PM at the office of Dr. Pollyann Savoy, [facility].   Please be advised, all patients with office appointments requiring lab work will take precedent over walk-in lab work.  If possible, please come for your lab work on Monday and Friday afternoons, as you may experience shorter wait times. The office is located at [address],  [place], [place] [address] No appointment is necessary.   Labs are drawn by [facility]. Please bring your co-pay at the time of your lab draw.  You may receive a bill from [facility] for your lab work.  If you wish to have your labs drawn at another location, please call the office 24 hours in advance to send orders.  If you have any questions regarding directions or hours of operation,  please call [phone].   As a reminder, please drink plenty of water prior to coming for your lab work. Thanks!   Ixekizumab injection What is this medication? IXEKIZUMAB (ix e KIZ ue mab) is used to treat plaque psoriasis, psoriatic arthritis, ankylosing spondylitis, and active non-radiographic axialspondyloarthritis. This medicine may be used for other purposes; ask your health care provider orpharmacist if you have questions. COMMON BRAND NAME(S): TALTZ What should I tell my care team before I take this medication? They need to know if you have any of these conditions: immune system problems infection (especially a viral infection such as chickenpox, cold sores, or herpes) recently received or are scheduled to receive a vaccine tuberculosis, a positive skin test for tuberculosis, or have recently been in close contact with someone who has tuberculosis an unusual or allergic reaction to ixekizumab, other medicines, foods, dyes or preservatives pregnant or trying to get pregnant breast-feeding How should I use this medication? This medicine is for injection under the skin. It may be administered by a healthcare professional in a hospital or clinic setting or at home. If you get this medicine at home, you will be taught how to prepare and give  this medicine. Use exactly as directed. Take your medicine at regular intervals. Donot take your medicine more often than directed. It is important that you put your used needles and syringes in a special sharps container. Do not put them in a trash can. If you do not have a  sharpscontainer, call your pharmacist or healthcare provider to get one. A special MedGuide will be given to you by the pharmacist with eachprescription and refill. Be sure to read this information carefully each time. Talk to your pediatrician regarding the use of this medicine in children. While this drug may be prescribed for children as young as 6 years for selectedconditions, precautions do apply. Overdosage: If you think you have taken too much of this medicine contact apoison control center or emergency room at once. NOTE: This medicine is only for you. Do not share this medicine with others. What if I miss a dose? It is important not to miss your dose. Call your doctor of health care professional if you are unable to keep an appointment. If you give yourself the medicine and you miss a dose, take it as soon as you can. Then be sure to take your next doses on your regular schedule. Do not take double or extra doses. Ifyou have questions about a missed injection, call your health care professional. What may interact with this medication? Do not take this medicine with any of the following medications: live virus vaccines This medicine may also interact with the following medications: inactivated vaccines This list may not describe all possible interactions. Give your health care provider a list of all the medicines, herbs, non-prescription drugs, or dietary supplements you use. Also tell them if you smoke, drink alcohol, or use illegaldrugs. Some items may interact with your medicine. What should I watch for while using this medication? Tell your doctor or healthcare professional if your symptoms do not start toget better or if they get worse. You will be tested for tuberculosis (TB) before you start this medicine. If your doctor prescribes any medicine for TB, you should start taking the TB medicine before starting this medicine. Make sure to finish the full course ofTB medicine. Call your  doctor or healthcare professional for advice if you get a fever, chills or sore throat, or other symptoms of a cold or flu. Do not treat yourself. This drug decreases your body's ability to fight infections. Try toavoid being around people who are sick. This medicine can decrease the response to a vaccine. If you need to get vaccinated, tell your healthcare professional if you have received this medicine within the last 6 months. Extra booster doses may be needed. Talk toyour doctor to see if a different vaccination schedule is needed. What side effects may I notice from receiving this medication? Side effects that you should report to your doctor or health care professionalas soon as possible: allergic reactions like skin rash, itching or hives, swelling of the face, lips, or tongue signs and symptoms of infection like fever or chills; cough; sore throat; pain or trouble passing urine signs and symptoms of bowel problems like abdominal pain, diarrhea, blood in the stool, and weight loss white patches in the mouth or throat vaginal discharge, itching, or odor in women Side effects that usually do not require medical attention (report to yourdoctor or health care professional if they continue or are bothersome): nausea runny nose sinus trouble This list may not describe all possible side effects. Call your doctor for medical advice about side  effects. You may report side effects to FDA at1-800-FDA-1088. Where should I keep my medication? Keep out of the reach of children. Store the prefilled syringe or injection pen in a refrigerator between 2 to 8 degrees C (36 to 46 degrees F). Keep the syringe or the pen in the original carton until ready for use. Protect from light. Do not freeze. Do not shake. Prior to use, remove the syringe or pen from the refrigerator and use within . Throw away any unused medicine after the expiration date on the label. NOTE: This sheet is a summary. It may not  cover all possible information. If you have questions about this medicine, talk to your doctor, pharmacist, orhealth care provider.  2022 Elsevier/Gold Standard (2020-01-19 15:12:26)

## 2021-04-23 NOTE — Telephone Encounter (Signed)
Please start Taltz auto-injector BIV.  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

## 2021-04-23 NOTE — Progress Notes (Signed)
Please call the patient to review x-ray results.    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  No progression was noted since 2018.    Hand x-rays were consistent with psoriatic arthritis. There is 1 new erosion in the left first metacarpal noted.  No progression of arthritis in right hand.

## 2021-04-23 NOTE — Progress Notes (Signed)
Pharmacy Note  Subjective:  Patient presents today to [facility] for follow up office visit.  Patient was seen by the pharmacist for counseling on Taltz for psoriatic arthritis and plaque psoriasis..  Prior therapy includes:Cosentyx 150mg  every 2 weeks. Last dose was 04/19/21. He states he has a knee surgery upcoming but not yet scheduled. He has appointment with Dr. 06/19/21 on 05/01/21 where decision of when will be decided. He has tried and failed Enbrel and Humira with inadequate response. Tried Remicade but discontinued due to cost and inconvenience of infusions  History of inflammatory bowel disease: No  Objective:  CBC    Component Value Date/Time   WBC 9.9 04/19/2021 1217   RBC 5.41 04/19/2021 1217   HGB 13.7 04/19/2021 1217   HGB 11.8 (L) 03/11/2019 1354   HGB 14.0 02/02/2015 0843   HCT 44.5 04/19/2021 1217   HCT 38.5 03/11/2019 1354   HCT 43.0 02/02/2015 0843   PLT 264 04/19/2021 1217   PLT 202 03/11/2019 1354   MCV 82.3 04/19/2021 1217   MCV 79 03/11/2019 1354   MCV 84.3 02/02/2015 0843   MCH 25.3 (L) 04/19/2021 1217   MCHC 30.8 (L) 04/19/2021 1217   RDW 15.1 (H) 04/19/2021 1217   RDW 17.2 (H) 03/11/2019 1354   RDW 18.7 (H) 02/02/2015 0843   LYMPHSABS 2,416 04/19/2021 1217   LYMPHSABS 2.5 03/11/2019 1354   LYMPHSABS 2.1 02/02/2015 0843   MONOABS 0.8 04/03/2017 1035   MONOABS 0.7 02/02/2015 0843   EOSABS 257 04/19/2021 1217   EOSABS 0.2 03/11/2019 1354   BASOSABS 69 04/19/2021 1217   BASOSABS 0.1 03/11/2019 1354   BASOSABS 0.0 02/02/2015 0843    CMP     Component Value Date/Time   NA 137 04/19/2021 1217   NA 139 03/11/2019 1354   NA 138 10/25/2014 1011   K 4.3 04/19/2021 1217   K 3.2 (L) 10/25/2014 1011   CL 101 04/19/2021 1217   CO2 25 04/19/2021 1217   CO2 24 10/25/2014 1011   GLUCOSE 84 04/19/2021 1217   GLUCOSE 103 10/25/2014 1011   BUN 14 04/19/2021 1217   BUN 10 03/11/2019 1354   BUN 13.4 10/25/2014 1011   CREATININE 0.99 04/19/2021  1217   CREATININE 1.0 10/25/2014 1011   CALCIUM 8.9 04/19/2021 1217   CALCIUM 8.5 10/25/2014 1011   PROT 7.6 04/19/2021 1217   PROT 7.1 03/11/2019 1354   PROT 7.0 10/25/2014 1011   ALBUMIN 4.0 12/21/2020 1129   ALBUMIN 3.8 (L) 03/11/2019 1354   ALBUMIN 3.7 10/25/2014 1011   AST 16 04/19/2021 1217   AST 18 10/25/2014 1011   ALT 18 04/19/2021 1217   ALT 16 10/25/2014 1011   ALKPHOS 108 12/21/2020 1129   ALKPHOS 83 10/25/2014 1011   BILITOT 0.5 04/19/2021 1217   BILITOT 0.4 03/11/2019 1354   BILITOT 0.43 10/25/2014 1011   GFRNONAA 97 04/19/2021 1217   GFRAA 112 04/19/2021 1217    Baseline Immunosuppressant Therapy Labs  Quantiferon TB Gold Latest Ref Rng & Units 11/28/2020  Quantiferon TB Gold Plus NEGATIVE NEGATIVE       Lab Results  Component Value Date   HIV NON-REACTIVE 07/23/2018    Immunoglobulin Electrophoresis Latest Ref Rng & Units 12/21/2020  IgA  47 - 310 mg/dL 02/18/2021)  IgG 829(H - 371 mg/dL -  IgM 50 - 6,967 mg/dL -    Serum Protein Electrophoresis Latest Ref Rng & Units 04/19/2021  Total Protein 6.1 - 8.1 g/dL 7.6  Albumin  3.8 - 4.8 g/dL -  Alpha-1 0.2 - 0.3 g/dL -  Alpha-2 0.5 - 0.9 g/dL -  Beta Globulin 0.4 - 0.6 g/dL -  Beta 2 0.2 - 0.5 g/dL -  Gamma Globulin 0.8 - 1.7 g/dL -    No results found for: G6PDH  No results found for: TPMT  Chest Xray:02/16/2009 - no active cardiopulmonary disease  Assessment/Plan:  Counseled patient that Altamease Oiler is a IL-17 inhibitor that works to reduce pain and inflammation associated with arthritis.  Counseled patient on purpose, proper use, and adverse effects of Taltz. Reviewed the most common adverse effects of infection (more commonly nasopharyngitis, URTI), inflammatory bowel disease, and allergic reaction. Counseled patient that Altamease Oiler should be held for infection and prior to scheduled surgery.  Counseled patient to avoid live vaccines while on Taltz. Recommend annual influenza, PCV 15 or PCV20 or Pneumovax 23, and  Shingrix as indicated. Reviewed storage information for Taltz.  He has received 2 COVID19 vaccines and is eligible for booster.  Reviewed the importance of regular labs while on Taltz. Will monitor CBC and CMP 1 month after starting and every 3 months routinely thereafter. Will monitor TB gold annually. Standing orders placed. Provided patient with medication education material and answered all questions.  Patient consented to Taltz.  Will upload consent into patient's chart.  Will apply for Taltz through patient's insurance and update when we receive a response.  Advised initial injection must be administered in office.  Patient voiced understanding.    Taltz dose will be: For psoriatic arthritis and plaque psoriasis overlap load of 160 mg then 80 mg on weeks 2,4,6,8,10,12 then 80 mg every 28 days  Prescription will be sent to pharmacy pending lab results and insurance approval.  He can start Taltz on or after 05/03/21 but pending when knee surgery is scheduled. We discussed delaying starting Taltz until this surgery is complete or can start Taltz before surgery if surgery will be significantly delayed.  Chesley Mires, PharmD, MPH Clinical Pharmacist (Rheumatology and Pulmonology)

## 2021-04-23 NOTE — Telephone Encounter (Signed)
Submitted a Prior Authorization request to [facility] for TALTZ via CoverMyMeds. Will update once we receive a response.   Key: [id number]

## 2021-04-26 ENCOUNTER — Telehealth: Payer: Self-pay

## 2021-04-26 NOTE — Telephone Encounter (Signed)
F/u question answered and faxed back to plan.

## 2021-04-26 NOTE — Telephone Encounter (Signed)
Cala Bradford from [facility] left a voicemail in regards to a PA request that we received for the patient's Taltz.  Before we can process this request we are going to need just one question answered.  Does the patient have any FDA labeled contraindications to Taltz.  Yes or no.  We will fax the information to the number that was placed on this case.  You can always fill it out and send it back or answer over the phone.  [phone]

## 2021-04-29 ENCOUNTER — Other Ambulatory Visit (HOSPITAL_COMMUNITY): Payer: Self-pay

## 2021-04-29 NOTE — Telephone Encounter (Signed)
Received notification from [facility] regarding a prior authorization for TALTZ. Authorization has been APPROVED from 04/23/2021 to 04/22/2022. Starter dose included in authorization.   Authorization # [id number]

## 2021-04-30 ENCOUNTER — Other Ambulatory Visit (HOSPITAL_COMMUNITY): Payer: Self-pay

## 2021-04-30 MED ORDER — TALTZ 80 MG/ML ~~LOC~~ SOAJ
SUBCUTANEOUS | 0 refills | Status: DC
  Filled 2021-04-30: qty 4, fill #0
  Filled 2021-05-01 – 2021-05-10 (×8): qty 4, 28d supply, fill #0

## 2021-04-30 NOTE — Telephone Encounter (Signed)
Rx for Taltz 160mg  at Week 0, then 80mg  at Week 2 and Week 4 sent to [facility] [place] to be couriered to clinic for new start visit and process DUR that we cannot process via test claim.  Remainder of loading dose rx will be sent at new start visit. routing back to [facility]. For assistance  [facility], PharmD, MPH Clinical Pharmacist (Rheumatology and Pulmonology)

## 2021-05-01 ENCOUNTER — Ambulatory Visit (INDEPENDENT_AMBULATORY_CARE_PROVIDER_SITE_OTHER): Payer: BC Managed Care – PPO | Admitting: Orthopedic Surgery

## 2021-05-01 ENCOUNTER — Other Ambulatory Visit (HOSPITAL_COMMUNITY): Payer: Self-pay

## 2021-05-01 ENCOUNTER — Other Ambulatory Visit: Payer: Self-pay

## 2021-05-01 ENCOUNTER — Ambulatory Visit: Payer: Managed Care, Other (non HMO) | Admitting: Rheumatology

## 2021-05-01 ENCOUNTER — Encounter: Payer: Self-pay | Admitting: Family Medicine

## 2021-05-01 DIAGNOSIS — M1711 Unilateral primary osteoarthritis, right knee: Secondary | ICD-10-CM | POA: Diagnosis not present

## 2021-05-01 DIAGNOSIS — M23303 Other meniscus derangements, unspecified medial meniscus, right knee: Secondary | ICD-10-CM | POA: Diagnosis not present

## 2021-05-01 DIAGNOSIS — Z6841 Body Mass Index (BMI) 40.0 and over, adult: Secondary | ICD-10-CM

## 2021-05-01 NOTE — Telephone Encounter (Signed)
Updated Therigy and WAMB, delivery location set up as clinic. Pending activity until new start date is determined. 

## 2021-05-02 ENCOUNTER — Other Ambulatory Visit (HOSPITAL_COMMUNITY): Payer: Self-pay

## 2021-05-02 NOTE — Telephone Encounter (Signed)
Called and spoke to plan, they are going to fax over a quantity exception form in order to get the loading dose quantities approved.

## 2021-05-03 NOTE — Telephone Encounter (Signed)
Form filled out and sent to Sherron Ales to be signed and returned to [place] for submission.

## 2021-05-06 ENCOUNTER — Encounter (HOSPITAL_BASED_OUTPATIENT_CLINIC_OR_DEPARTMENT_OTHER): Payer: Self-pay | Admitting: Orthopedic Surgery

## 2021-05-06 ENCOUNTER — Other Ambulatory Visit: Payer: Self-pay

## 2021-05-06 NOTE — Telephone Encounter (Signed)
Form placed in Dr. Fatima Sanger folder to have signed since Sherron Ales, PA-C, is out of office on 05/08/21

## 2021-05-06 NOTE — Progress Notes (Signed)
Called and spoke with Dr. Raynelle Dick regarding pt's upcoming surgery with Dr August Saucer. Pt has a hx of difficult airway ( see note 02/16/20 ) BMI then 42, now 46.  Dr Sampson Goon recommends Pt come in for PAT appt 05/09/21 -EKG and anesthesia consult. Pre op call done with pt, DOS and PAT instructions given.

## 2021-05-07 ENCOUNTER — Encounter: Payer: Self-pay | Admitting: Orthopedic Surgery

## 2021-05-07 NOTE — Progress Notes (Signed)
Office Visit Note   Patient: Joseph [name]           Date of Birth: Jan 27, 1981           MRN: [id number] Visit Date: 05/01/2021 Requested by: Lavada Mesi, MD [address] [place],  [place] [address] PCP: Lavada Mesi, MD  Subjective: Chief Complaint  Patient presents with   Right Knee - Pain    HPI: Joseph [name] is a 40 y.o. male who presents to the office complaining of right knee pain.  Patient complains of knee pain for several months.  Pain initially began in his left knee about 2 to 3 months ago and then he began having concurrent right knee pain.  He has no history of knee pain.  No history of injury that he can recall.  Describes diffuse knee pain through both knees without any focal area of significant pain.  Denies any history of prior surgery to either knee.  He has seen Dr. Prince Rome and has had cortisone injection, gel injection, Voltaren course without any relief.  He does have history of psoriasis and psoriatic arthritis since he was 40 years old.  No history of gout.  He has seen a rheumatologist and had an MRI scan of his right knee.  MRI of the right knee revealed chondral thinning of the medial compartment as well as vertical tear of the medial meniscus.  Denies any instability that was leg feels like it wants to give out on him.  He has difficulty walking.  Especially with stairs.  He wakes with pain most nights..                ROS: All systems reviewed are negative as they relate to the chief complaint within the history of present illness.  Patient denies fevers or chills.  Assessment & Plan: Visit Diagnoses:  1. Unilateral primary osteoarthritis, right knee   2. 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  He has history of bilateral knees bothering him over the last several months without any history of injury.  He has had MRIs of both knees with no  arthroscopically treatable pathology that was found in either knee aside from a small vertical tear of the medial meniscus.  This was viewed with the patient today and the meniscal tear appears underwhelming.  Overall, patient does have chondral thinning of the medial compartment as well as an overall diminutive appearance of the medial meniscus when compared to the lateral meniscus.  Suspect that a lot of patient's pain is stemming from the degeneration of his medial meniscus and medial compartment cartilage.  Discussed options available to patient.  He would like to proceed with arthroscopy.  Arthroscopy was discussed and really seems that it is a 50-50 shot whether or not arthroscopic intervention provides lasting relief for him.  He understands this risk but he has tried everything else and he is at the point where he feels he wants to try anything that has a chance of helping his knee pain.  Plan to proceed with right knee arthroscopy.  Risks and benefits were discussed with the patient today.  Plan to follow-up after procedure.  Follow-Up Instructions: No follow-ups on file.   Orders:  No orders of the defined types were placed in this encounter.  No orders of the defined types were placed in this encounter.  Procedures: No procedures performed   Clinical Data: No additional findings.  Objective: Vital Signs: There were no vitals taken for this visit.  Physical Exam:  Constitutional: Patient appears well-developed HEENT:  Head: Normocephalic Eyes:EOM are normal Neck: Normal range of motion Cardiovascular: Normal rate Pulmonary/chest: Effort normal Neurologic: Patient is alert Skin: Skin is warm Psychiatric: Patient has normal mood and affect  Ortho Exam: Ortho exam demonstrates right knee with 0 degrees extension and 115 degrees of knee flexion.  Pain increased with knee flexion.  Tenderness over the medial and lateral joint lines primarily.  No tenderness over the quad  tendon, patellar tendon, patella.  [name] able to form straight leg raise without extensor lag.  No calf tenderness.  Negative Homans' sign.  No pain with hip range of motion.  No groin pain elicited on exam.  Stable to anterior/posterior drawer.  No instability or laxity to varus/valgus stress.  Specialty Comments:  No specialty comments available.  Imaging: No results found.   PMFS History: Patient Active Problem List   Diagnosis Date Noted   Elbow dislocation, right, initial encounter 03/06/2020   Closed disp fx of right olecranon with intraartic exten w malunion 02/14/2020   Morbid obesity with BMI of 45.0-49.9, adult (HCC) 11/25/2019   Hair loss 12/31/2017   Varicose veins of both lower extremities 07/26/2017   Iron deficiency anemia 09/21/2014   Psoriatic arthritis (HCC) 09/21/2014   ESSENTIAL HYPERTENSION, MALIGNANT 02/18/2009   Nonspecific (abnormal) findings on radiological and other examination of body structure 02/16/2009   CHEST XRAY, ABNORMAL 02/16/2009   Past Medical History:  Diagnosis Date   Acute sinus infection 11/07/2014   Arthritis    Blood transfusion without reported diagnosis    At [facility]    Difficult intubation 01/13/2020   difficult airway in OR (per Dr Rollene Fare)   Iron deficiency anemia 09/21/2014   Psoriasis    Psoriatic arthritis (HCC)    Psoriatic arthritis (HCC)    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 Antoine Poche 02/2009 with echo, as needed f/u   Torn meniscus    right knee    Family History  Problem Relation Age of Onset   Pulmonary fibrosis Mother    Hypertension Mother    Pancreatic cancer Maternal Grandmother    Rheum arthritis Paternal Aunt    Cancer Paternal Grandmother    Breast cancer Paternal Grandmother    COPD Paternal Grandfather    CAD Paternal Grandfather    Colon cancer Neg Hx    Prostate cancer Neg Hx    Diabetes  Neg Hx    Thyroid disease Neg Hx    Liver cancer Neg Hx    Stomach cancer Neg Hx    Rectal cancer Neg Hx     Past Surgical History:  Procedure Laterality Date   COLONOSCOPY WITH ESOPHAGOGASTRODUODENOSCOPY (EGD)     EXTERNAL FIXATION REMOVAL Right 03/22/2020   Procedure: REMOVAL EXTERNAL FIXATION ARM;  Surgeon: Roby Lofts, MD;  Location: [facility] OR;  Service: Orthopedics;  Laterality: Right;   HERNIA REPAIR  1994   umbicial   INNER EAR SURGERY Left    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, 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   LIGAMENT REPAIR Right 01/13/2020   Procedure: COLLATERAL LIGAMENT REPAIRS;  Surgeon: Mack Hook, MD;  Location: [facility];  Service: Orthopedics;  Laterality: Right;   ORIF ELBOW FRACTURE Right 02/17/2020  Procedure: REVISION OPEN REDUCTION INTERNAL FIXATION (ORIF) ELBOW/OLECRANON FRACTURE;  Surgeon: Roby Lofts, MD;  Location: [facility] OR;  Service: Orthopedics;  Laterality: Right;   ORIF ULNAR FRACTURE Right 01/13/2020   Procedure: OPEN TREATMENT OF COMPLEX RIGHT PROXIMAL ULNA FRACTURE;  Surgeon: Mack Hook, MD;  Location: [facility];  Service: Orthopedics;  Laterality: Right;  PROCEDURE: OPEN TREATMENT OF COMPLEX RIGHT PROXIMAL ULNA FRACTURE WITH POSSIBLE COLLATERAL LIGAMENT REPAIRS LENGTH OF SURGERY: 2.5 HOURSMAC + REGIONAL BLOCK   Social History   Occupational History   Not on file  Tobacco Use   Smoking status: Never   Smokeless tobacco: Never  Vaping Use   Vaping Use: Never used  Substance and Sexual Activity   Alcohol use: No   Drug use: No   Sexual activity: Yes

## 2021-05-07 NOTE — Telephone Encounter (Signed)
Quantity limit form signed by Dr. Corliss Skains for Joseph [name]. Faxed to [facility]  Fax: [phone]

## 2021-05-08 ENCOUNTER — Other Ambulatory Visit (HOSPITAL_COMMUNITY): Payer: Self-pay

## 2021-05-08 NOTE — Telephone Encounter (Signed)
Received notification from [facility] regarding a prior authorization for TALTZ. Quantity limit exception for loading dose has been APPROVED from 04/23/21 to 04/22/22.    Patient can fill through [facility] (rx already sent but unable to process prescription. May take 24 hours to process in system. Will try again later today)   Authorization # [id number]   Chesley Mires, PharmD, MPH Clinical Pharmacist (Rheumatology and Pulmonology)

## 2021-05-09 ENCOUNTER — Other Ambulatory Visit (HOSPITAL_COMMUNITY): Payer: Self-pay

## 2021-05-09 ENCOUNTER — Encounter (HOSPITAL_BASED_OUTPATIENT_CLINIC_OR_DEPARTMENT_OTHER)
Admission: RE | Admit: 2021-05-09 | Discharge: 2021-05-09 | Disposition: A | Discharge status: Home / Self Care | Payer: BC Managed Care – PPO | Source: Ambulatory Visit | Attending: Orthopedic Surgery | Admitting: Orthopedic Surgery

## 2021-05-09 DIAGNOSIS — L4050 Arthropathic psoriasis, unspecified: Secondary | ICD-10-CM | POA: Diagnosis not present

## 2021-05-09 DIAGNOSIS — Z91030 Bee allergy status: Secondary | ICD-10-CM | POA: Diagnosis not present

## 2021-05-09 DIAGNOSIS — S83241A Other tear of medial meniscus, current injury, right knee, initial encounter: Secondary | ICD-10-CM | POA: Diagnosis not present

## 2021-05-09 DIAGNOSIS — Z882 Allergy status to sulfonamides status: Secondary | ICD-10-CM | POA: Diagnosis not present

## 2021-05-09 DIAGNOSIS — X58XXXA Exposure to other specified factors, initial encounter: Secondary | ICD-10-CM | POA: Diagnosis not present

## 2021-05-09 DIAGNOSIS — Z79899 Other long term (current) drug therapy: Secondary | ICD-10-CM | POA: Diagnosis not present

## 2021-05-09 DIAGNOSIS — Z01812 Encounter for preprocedural laboratory examination: Secondary | ICD-10-CM | POA: Insufficient documentation

## 2021-05-09 LAB — BASIC METABOLIC PANEL
Anion gap: 9 (ref 5–15)
BUN: 10 mg/dL (ref 6–20)
CO2: 26 mmol/L (ref 22–32)
Calcium: 8.7 mg/dL — ABNORMAL LOW (ref 8.9–10.3)
Chloride: 101 mmol/L (ref 98–111)
Creatinine, Ser: 1.01 mg/dL (ref 0.61–1.24)
GFR, Estimated: >60 mL/min (ref >60)
Glucose, Bld: 191 mg/dL — ABNORMAL HIGH (ref 70–99)
Potassium: 3.9 mmol/L (ref 3.5–5.1)
Sodium: 136 mmol/L (ref 135–145)

## 2021-05-09 LAB — CBC
HCT: 44.8 % (ref 39.0–52.0)
Hemoglobin: 13.8 g/dL (ref 13.0–17.0)
MCH: 25.1 pg — ABNORMAL LOW (ref 26.0–34.0)
MCHC: 30.8 g/dL (ref 30.0–36.0)
MCV: 81.6 fL (ref 80.0–100.0)
Platelets: 238 10*3/uL (ref 150–400)
RBC: 5.49 MIL/uL (ref 4.22–5.81)
RDW: 15.6 % — ABNORMAL HIGH (ref 11.5–15.5)
WBC: 10.8 10*3/uL — ABNORMAL HIGH (ref 4.0–10.5)
nRBC: 0 % (ref 0.0–0.2)

## 2021-05-09 NOTE — Progress Notes (Signed)

## 2021-05-09 NOTE — Progress Notes (Addendum)
Chart reviewed and patient evaluated by Dr. Hyacinth Meeker, ok to proceed as planned with surgery at [facility].

## 2021-05-09 NOTE — Telephone Encounter (Signed)
DUR for loading dose PA is still unable to be resolved, will continue to f/u.

## 2021-05-09 NOTE — Anesthesia Preprocedure Evaluation (Addendum)
Anesthesia Evaluation  Patient identified by MRN, date of birth, ID band Patient awake    Reviewed: Allergy & Precautions, NPO status , Patient's Chart, lab work & pertinent test results  Airway Mallampati: II  TM Distance: >3 FB     Dental   Pulmonary    breath sounds clear to auscultation       Cardiovascular hypertension,  Rhythm:Regular Rate:Normal     Neuro/Psych    GI/Hepatic negative GI ROS, Neg liver ROS,   Endo/Other  Morbid obesity  Renal/GU negative Renal ROS     Musculoskeletal  (+) Arthritis ,   Abdominal   Peds  Hematology  (+) anemia ,   Anesthesia Other Findings   Reproductive/Obstetrics                           Anesthesia Physical Anesthesia Plan  ASA: 3  Anesthesia Plan: General   Post-op Pain Management:    Induction: Intravenous  PONV Risk Score and Plan: Ondansetron, Dexamethasone and Midazolam  Airway Management Planned: LMA  Additional Equipment:   Intra-op Plan:   Post-operative Plan: Extubation in OR  Informed Consent: I have reviewed the patients History and Physical, chart, labs and discussed the procedure including the risks, benefits and alternatives for the proposed anesthesia with the patient or authorized representative who has indicated his/her understanding and acceptance.     Dental advisory given  Plan Discussed with: CRNA and Anesthesiologist  Anesthesia Plan Comments:         Anesthesia Quick Evaluation

## 2021-05-10 ENCOUNTER — Ambulatory Visit (HOSPITAL_BASED_OUTPATIENT_CLINIC_OR_DEPARTMENT_OTHER): Payer: BC Managed Care – PPO | Admitting: Anesthesiology

## 2021-05-10 ENCOUNTER — Other Ambulatory Visit: Payer: Self-pay

## 2021-05-10 ENCOUNTER — Other Ambulatory Visit (HOSPITAL_COMMUNITY): Payer: Self-pay

## 2021-05-10 ENCOUNTER — Encounter (HOSPITAL_BASED_OUTPATIENT_CLINIC_OR_DEPARTMENT_OTHER)
Admission: RE | Disposition: A | Discharge status: Home / Self Care | Payer: Self-pay | Source: Ambulatory Visit | Attending: Orthopedic Surgery

## 2021-05-10 ENCOUNTER — Encounter (HOSPITAL_BASED_OUTPATIENT_CLINIC_OR_DEPARTMENT_OTHER): Payer: Self-pay | Admitting: Orthopedic Surgery

## 2021-05-10 ENCOUNTER — Telehealth: Payer: Self-pay

## 2021-05-10 ENCOUNTER — Ambulatory Visit (HOSPITAL_BASED_OUTPATIENT_CLINIC_OR_DEPARTMENT_OTHER)
Admission: RE | Admit: 2021-05-10 | Discharge: 2021-05-10 | Disposition: A | Discharge status: Home / Self Care | Payer: BC Managed Care – PPO | Source: Ambulatory Visit | Attending: Orthopedic Surgery | Admitting: Orthopedic Surgery

## 2021-05-10 DIAGNOSIS — M23231 Derangement of other medial meniscus due to old tear or injury, right knee: Secondary | ICD-10-CM | POA: Diagnosis not present

## 2021-05-10 DIAGNOSIS — Z79899 Other long term (current) drug therapy: Secondary | ICD-10-CM | POA: Insufficient documentation

## 2021-05-10 DIAGNOSIS — Z91030 Bee allergy status: Secondary | ICD-10-CM | POA: Insufficient documentation

## 2021-05-10 DIAGNOSIS — X58XXXA Exposure to other specified factors, initial encounter: Secondary | ICD-10-CM | POA: Diagnosis not present

## 2021-05-10 DIAGNOSIS — S83241D Other tear of medial meniscus, current injury, right knee, subsequent encounter: Secondary | ICD-10-CM

## 2021-05-10 DIAGNOSIS — Z882 Allergy status to sulfonamides status: Secondary | ICD-10-CM | POA: Diagnosis not present

## 2021-05-10 DIAGNOSIS — L4050 Arthropathic psoriasis, unspecified: Secondary | ICD-10-CM | POA: Diagnosis not present

## 2021-05-10 DIAGNOSIS — S83241A Other tear of medial meniscus, current injury, right knee, initial encounter: Secondary | ICD-10-CM | POA: Insufficient documentation

## 2021-05-10 DIAGNOSIS — D509 Iron deficiency anemia, unspecified: Secondary | ICD-10-CM | POA: Diagnosis not present

## 2021-05-10 DIAGNOSIS — I10 Essential (primary) hypertension: Secondary | ICD-10-CM | POA: Diagnosis not present

## 2021-05-10 HISTORY — PX: KNEE ARTHROSCOPY: SHX127

## 2021-05-10 SURGERY — ARTHROSCOPY, KNEE
Anesthesia: General | Site: Knee | Laterality: Right

## 2021-05-10 MED ORDER — CLONIDINE HCL (ANALGESIA) 100 MCG/ML EP SOLN
EPIDURAL | Status: AC
  Filled 2021-05-10: qty 10

## 2021-05-10 MED ORDER — POVIDONE-IODINE 7.5 % EX SOLN: Freq: Once | CUTANEOUS | Status: DC

## 2021-05-10 MED ORDER — DEXAMETHASONE SODIUM PHOSPHATE 10 MG/ML IJ SOLN
INTRAMUSCULAR | Status: DC | PRN
  Administered 2021-05-10: 5 mg via INTRAVENOUS

## 2021-05-10 MED ORDER — ASPIRIN 81 MG PO CHEW: 81.0000 mg | CHEWABLE_TABLET | Freq: Every day | ORAL | 0 refills | Status: AC

## 2021-05-10 MED ORDER — MIDAZOLAM HCL 2 MG/2ML IJ SOLN
INTRAMUSCULAR | Status: AC
  Filled 2021-05-10: qty 2

## 2021-05-10 MED ORDER — CEFAZOLIN IN SODIUM CHLORIDE 3-0.9 GM/100ML-% IV SOLN
INTRAVENOUS | Status: AC
  Filled 2021-05-10: qty 100

## 2021-05-10 MED ORDER — LIDOCAINE-EPINEPHRINE 1 %-1:100000 IJ SOLN
INTRAMUSCULAR | Status: AC
  Filled 2021-05-10: qty 1

## 2021-05-10 MED ORDER — LIDOCAINE 2% (20 MG/ML) 5 ML SYRINGE
INTRAMUSCULAR | Status: DC | PRN
  Administered 2021-05-10: 80 mg via INTRAVENOUS

## 2021-05-10 MED ORDER — MORPHINE SULFATE (PF) 4 MG/ML IV SOLN
INTRAVENOUS | Status: AC
  Filled 2021-05-10: qty 2

## 2021-05-10 MED ORDER — FENTANYL CITRATE (PF) 100 MCG/2ML IJ SOLN
INTRAMUSCULAR | Status: AC
  Filled 2021-05-10: qty 2

## 2021-05-10 MED ORDER — LACTATED RINGERS IV SOLN: INTRAVENOUS | Status: DC

## 2021-05-10 MED ORDER — LACTATED RINGERS IV SOLN: INTRAVENOUS | Status: DC | PRN

## 2021-05-10 MED ORDER — SODIUM CHLORIDE 0.9 % IR SOLN
Status: DC | PRN
  Administered 2021-05-10: 2 mL

## 2021-05-10 MED ORDER — BUPIVACAINE-EPINEPHRINE 0.5% -1:200000 IJ SOLN
INTRAMUSCULAR | Status: DC | PRN
  Administered 2021-05-10: 20 mL
  Administered 2021-05-10: 10 mL

## 2021-05-10 MED ORDER — CLONIDINE HCL (ANALGESIA) 100 MCG/ML EP SOLN
EPIDURAL | Status: DC | PRN
  Administered 2021-05-10: 1 mL

## 2021-05-10 MED ORDER — CEFAZOLIN SODIUM-DEXTROSE 2-3 GM-%(50ML) IV SOLR
INTRAVENOUS | Status: DC | PRN
  Administered 2021-05-10: 3 g via INTRAVENOUS

## 2021-05-10 MED ORDER — PROPOFOL 500 MG/50ML IV EMUL
INTRAVENOUS | Status: DC | PRN
  Administered 2021-05-10: 25 ug/kg/min via INTRAVENOUS

## 2021-05-10 MED ORDER — PROPOFOL 10 MG/ML IV BOLUS
INTRAVENOUS | Status: AC
  Filled 2021-05-10: qty 20

## 2021-05-10 MED ORDER — EPINEPHRINE PF 1 MG/ML IJ SOLN
INTRAMUSCULAR | Status: AC
  Filled 2021-05-10: qty 2

## 2021-05-10 MED ORDER — FENTANYL CITRATE (PF) 100 MCG/2ML IJ SOLN
INTRAMUSCULAR | Status: DC | PRN
  Administered 2021-05-10: 50 ug via INTRAVENOUS
  Administered 2021-05-10: 25 ug via INTRAVENOUS
  Administered 2021-05-10: 50 ug via INTRAVENOUS

## 2021-05-10 MED ORDER — BUPIVACAINE-EPINEPHRINE (PF) 0.5% -1:200000 IJ SOLN
INTRAMUSCULAR | Status: AC
  Filled 2021-05-10: qty 30

## 2021-05-10 MED ORDER — ONDANSETRON HCL 4 MG/2ML IJ SOLN
INTRAMUSCULAR | Status: DC | PRN
  Administered 2021-05-10: 4 mg via INTRAVENOUS

## 2021-05-10 MED ORDER — PROPOFOL 10 MG/ML IV BOLUS
INTRAVENOUS | Status: DC | PRN
  Administered 2021-05-10: 200 mg via INTRAVENOUS

## 2021-05-10 MED ORDER — OXYCODONE HCL 5 MG PO TABS: 5.0000 mg | ORAL_TABLET | ORAL | 0 refills | Status: DC | PRN

## 2021-05-10 MED ORDER — FENTANYL CITRATE (PF) 100 MCG/2ML IJ SOLN: 25.0000 ug | INTRAMUSCULAR | Status: DC | PRN

## 2021-05-10 MED ORDER — MORPHINE SULFATE (PF) 4 MG/ML IV SOLN
INTRAVENOUS | Status: DC | PRN
  Administered 2021-05-10: 8 mg

## 2021-05-10 MED ORDER — CEFAZOLIN IN SODIUM CHLORIDE 3-0.9 GM/100ML-% IV SOLN: 3.0000 g | INTRAVENOUS | Status: DC

## 2021-05-10 MED ORDER — MIDAZOLAM HCL 5 MG/5ML IJ SOLN
INTRAMUSCULAR | Status: DC | PRN
  Administered 2021-05-10: 2 mg via INTRAVENOUS

## 2021-05-10 MED ORDER — POVIDONE-IODINE 10 % EX SWAB: 2.0000 "application " | Freq: Once | CUTANEOUS | Status: DC

## 2021-05-10 SURGICAL SUPPLY — 49 items
BANDAGE ESMARK 6X9 LF (GAUZE/BANDAGES/DRESSINGS) ×1 IMPLANT
BLADE EXCALIBUR 4.0MM X 13CM (MISCELLANEOUS)
BLADE EXCALIBUR 4.0X13 (MISCELLANEOUS) IMPLANT
BLADE SURG 15 STRL LF DISP TIS (BLADE) IMPLANT
BLADE SURG 15 STRL SS (BLADE)
BNDG CMPR 9X6 STRL LF SNTH (GAUZE/BANDAGES/DRESSINGS) ×1
BNDG ELASTIC 4X5.8 VLCR STR LF (GAUZE/BANDAGES/DRESSINGS) ×3 IMPLANT
BNDG ELASTIC 6X5.8 VLCR STR LF (GAUZE/BANDAGES/DRESSINGS) ×3 IMPLANT
BNDG ESMARK 6X9 LF (GAUZE/BANDAGES/DRESSINGS) ×3
CUFF TOURN SGL QUICK 34 (TOURNIQUET CUFF)
CUFF TRNQT CYL 34X4.125X (TOURNIQUET CUFF) IMPLANT
DISSECTOR  3.8MM X 13CM (MISCELLANEOUS) ×3
DISSECTOR 3.8MM X 13CM (MISCELLANEOUS) ×1 IMPLANT
DRAPE ARTHROSCOPY W/POUCH 90 (DRAPES) ×3 IMPLANT
DRAPE U-SHAPE 47X51 STRL (DRAPES) ×6 IMPLANT
DRSG TEGADERM 4X4.75 (GAUZE/BANDAGES/DRESSINGS) ×6 IMPLANT
DURAPREP 26ML APPLICATOR (WOUND CARE) ×3 IMPLANT
DW OUTFLOW CASSETTE/TUBE SET (MISCELLANEOUS) ×3 IMPLANT
EXCALIBUR 3.8MM X 13CM (MISCELLANEOUS) IMPLANT
GAUZE 4X4 16PLY ~~LOC~~+RFID DBL (SPONGE) IMPLANT
GAUZE SPONGE 4X4 12PLY STRL (GAUZE/BANDAGES/DRESSINGS) ×3 IMPLANT
GAUZE XEROFORM 1X8 LF (GAUZE/BANDAGES/DRESSINGS) ×3 IMPLANT
GLOVE SRG 8 PF TXTR STRL LF DI (GLOVE) ×1 IMPLANT
GLOVE SURG ENC MOIS LTX SZ7 (GLOVE) ×3 IMPLANT
GLOVE SURG ORTHO LTX SZ8 (GLOVE) ×3 IMPLANT
GLOVE SURG UNDER POLY LF SZ7 (GLOVE) ×3 IMPLANT
GLOVE SURG UNDER POLY LF SZ8 (GLOVE) ×3
GOWN STRL REUS W/ TWL LRG LVL3 (GOWN DISPOSABLE) ×1 IMPLANT
GOWN STRL REUS W/ TWL XL LVL3 (GOWN DISPOSABLE) ×1 IMPLANT
GOWN STRL REUS W/TWL LRG LVL3 (GOWN DISPOSABLE) ×3
GOWN STRL REUS W/TWL XL LVL3 (GOWN DISPOSABLE) ×3
MANIFOLD NEPTUNE II (INSTRUMENTS) ×3 IMPLANT
NDL SAFETY ECLIPSE 18X1.5 (NEEDLE) ×2 IMPLANT
NEEDLE HYPO 18GX1.5 BLUNT FILL (NEEDLE) ×3 IMPLANT
NEEDLE HYPO 18GX1.5 SHARP (NEEDLE) ×6
PACK ARTHROSCOPY DSU (CUSTOM PROCEDURE TRAY) ×3 IMPLANT
PACK BASIN DAY SURGERY FS (CUSTOM PROCEDURE TRAY) ×3 IMPLANT
PADDING CAST COTTON 6X4 STRL (CAST SUPPLIES) ×3 IMPLANT
PENCIL SMOKE EVACUATOR (MISCELLANEOUS) IMPLANT
PORT APPOLLO RF 90DEGREE MULTI (SURGICAL WAND) IMPLANT
SUT ETHILON 3 0 PS 1 (SUTURE) ×3 IMPLANT
SUT VIC AB 2-0 SH 27 (SUTURE)
SUT VIC AB 2-0 SH 27XBRD (SUTURE) IMPLANT
SYR 5ML LL (SYRINGE) ×3 IMPLANT
SYR TB 1ML LL NO SAFETY (SYRINGE) ×3 IMPLANT
TOWEL GREEN STERILE FF (TOWEL DISPOSABLE) ×3 IMPLANT
TRAY DSU PREP LF (CUSTOM PROCEDURE TRAY) ×3 IMPLANT
TUBING ARTHROSCOPY IRRIG 16FT (MISCELLANEOUS) ×3 IMPLANT
WRAP KNEE MAXI GEL POST OP (GAUZE/BANDAGES/DRESSINGS) ×6 IMPLANT

## 2021-05-10 NOTE — Brief Op Note (Signed)
   05/10/2021  8:28 AM  PATIENT:  [name]  40 y.o. male  PRE-OPERATIVE DIAGNOSIS:  right knee degenerative meniscal tear  POST-OPERATIVE DIAGNOSIS:  right knee degenerative medial meniscal tear  PROCEDURE:  Procedure(s): ARTHROSCOPY KNEE partial medial meniscectomy  SURGEON:  Surgeon(s): Cammy Copa, MD  ASSISTANT: none  ANESTHESIA:   general  EBL: 3 ml    Total I/O In: 800 [I.V.:800] Out: -   BLOOD ADMINISTERED: none  DRAINS: none   LOCAL MEDICATIONS USED:  marcaine mso4 clonidine  SPECIMEN:  No Specimen  COUNTS:  YES  TOURNIQUET:  * Missing tourniquet times found for documented tourniquets in log: [id number] *  DICTATION: .Other Dictation: Dictation Number [id number]  PLAN OF CARE: Discharge to home after PACU  PATIENT DISPOSITION:  PACU - hemodynamically stable

## 2021-05-10 NOTE — Telephone Encounter (Signed)
Patient enrolled into Taltz savings card.  RXBIN: [id number] PCNMena Goes: [id number] ID: [id number] Expiration Date: 11/10/2023  He has f/u with Dr. August Saucer on 05/17/21 and his wife has been notified that he will need clearance from Dr. August Saucer to start Altamease Oiler. Advised her to reach out to our clinic for any emergent needs or flares.  Routing to [employer] to help coordinate shipment of med to clinic. Wife advised that Mills Koller will reach out to collect any copay and med will be couriered to clinic for first dose.  Chesley Mires, PharmD, MPH Clinical Pharmacist (Rheumatology and Pulmonology)

## 2021-05-10 NOTE — Telephone Encounter (Signed)
Returned call to patient's wife stating that pharmacy was having difficult adjudicating claim. Was able to call insurance to have it resolved. Will f/u in Taltz encounter.  Wife states his surgery went well and he has f/u with Dr. August Saucer on 05/17/21. She has been notified that he will need clearance by Dr. August Saucer to start Altamease Oiler. She verbalized understanding.  Chesley Mires, PharmD, MPH Clinical Pharmacist (Rheumatology and Pulmonology)

## 2021-05-10 NOTE — Discharge Instructions (Signed)

## 2021-05-10 NOTE — Anesthesia Postprocedure Evaluation (Signed)
Anesthesia Post Note  Patient: Joseph [name]  Procedure(s) Performed: ARTHROSCOPY KNEE partial medial meniscectomy (Right: Knee)     Patient location during evaluation: PACU Anesthesia Type: General Level of consciousness: awake Pain management: pain level controlled Vital Signs Assessment: post-procedure vital signs reviewed and stable Respiratory status: spontaneous breathing Cardiovascular status: stable Postop Assessment: no apparent nausea or vomiting Anesthetic complications: no   No notable events documented.  Last Vitals:  Vitals:   05/10/21 0845 05/10/21 0900  BP: 130/86 (!) 136/91  Pulse: 88 82  Resp: 20 15  Temp:    SpO2: 97% 91%    Last Pain:  Vitals:   05/10/21 0845  TempSrc:   PainSc: 0-No pain                 [name]

## 2021-05-10 NOTE — H&P (Signed)
Joseph [name] is an 40 y.o. male.   Chief Complaint: Right knee pain HPI: Joseph [name] is a 40 y.o. male who presents to the office complaining of right knee pain.  Patient complains of knee pain for several months.  Pain initially began in his left knee about 2 to 3 months ago and then he began having concurrent right knee pain.  He has no history of knee pain.  No history of injury that he can recall.  Describes diffuse knee pain through both knees without any focal area of significant pain.  Denies any history of prior surgery to either knee.  He has seen Dr. Prince Rome and has had cortisone injection, gel injection, Voltaren course without any relief.  He does have history of psoriasis and psoriatic arthritis since he was 40 years old.  No history of gout.  He has seen a rheumatologist and had an MRI scan of his right knee.  MRI of the right knee revealed chondral thinning of the medial compartment as well as underwhelming vertical tear of the medial meniscus.  Denies any instability that was leg feels like it wants to give out on him.  He has difficulty walking.  Especially with stairs.  He wakes with pain most nights..                  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   LIGAMENT REPAIR Right 01/13/2020   Procedure: COLLATERAL LIGAMENT REPAIRS;  Surgeon: Mack Hook, MD;  Location: [facility];  Service: Orthopedics;  Laterality: Right;   ORIF ELBOW FRACTURE Right 02/17/2020   Procedure: REVISION OPEN REDUCTION INTERNAL FIXATION (ORIF) ELBOW/OLECRANON FRACTURE;  Surgeon: Roby Lofts, MD;  Location: [facility] OR;  Service: Orthopedics;  Laterality: Right;   ORIF ULNAR FRACTURE Right 01/13/2020   Procedure: OPEN TREATMENT OF COMPLEX RIGHT PROXIMAL ULNA FRACTURE;  Surgeon: Mack Hook, MD;  Location: [facility];  Service: Orthopedics;  Laterality: Right;  PROCEDURE: OPEN TREATMENT OF COMPLEX RIGHT PROXIMAL ULNA FRACTURE WITH POSSIBLE COLLATERAL LIGAMENT REPAIRS LENGTH OF SURGERY: 2.5 HOURSMAC + REGIONAL BLOCK    Family History  Problem Relation Age of Onset   Pulmonary fibrosis Mother    Hypertension Mother    Pancreatic cancer Maternal Grandmother    Rheum arthritis Paternal Aunt    Cancer Paternal Grandmother    Breast cancer Paternal Grandmother    COPD Paternal Grandfather    CAD Paternal Grandfather  Colon cancer Neg Hx    Prostate cancer Neg Hx    Diabetes Neg Hx    Thyroid disease Neg Hx    Liver cancer Neg Hx    Stomach cancer Neg Hx    Rectal cancer Neg Hx    Social History:  reports that he has never smoked. He has never used smokeless tobacco. He reports that he does not drink alcohol and does not use drugs.  Allergies:  Allergies  Allergen Reactions   Sulfonamide Derivatives Other (See Comments)    UNSPECIFIED   Bee Venom Palpitations    unknown    Medications Prior to Admission  Medication Sig Dispense Refill   clobetasol cream (TEMOVATE) 0.05 % Apply topically 2 (two) times daily as needed. (Patient  taking differently: Apply 1 application topically 2 (two) times daily as needed (irritation).) 30 g 1   enalapril (VASOTEC) 5 MG tablet TAKE 1 TABLET BY MOUTH EVERY DAY 90 tablet 1   predniSONE (DELTASONE) 5 MG tablet Take 4 tablets by mouth daily x4 days, 3 tablets by mouth daily x4 days, 2 tablets by mouth daily x4 days, 1 tablet by mouth daily x4 days. 40 tablet 0   Ixekizumab (TALTZ) 80 MG/ML SOAJ Inject 160mg  into the skin at Week 0, then 80mg  at Week 2 and Week 4. Deliver to clinic: [address], [place] 4 mL 0   nabumetone (RELAFEN) 500 MG tablet Take 1 tablet (500 mg total) by mouth 2 (two) times daily as needed. 60 tablet 3    Results for orders placed or performed during the hospital encounter of 05/10/21 (from the past 48 hour(s))  CBC     Status: Abnormal   Collection Time: 05/09/21 11:00 AM  Result Value Ref Range   WBC 10.8 (H) 4.0 - 10.5 K/uL   RBC 5.49 4.22 - 5.81 MIL/uL   Hemoglobin 13.8 13.0 - 17.0 g/dL   HCT 07/11/21 05/11/21 - 09.8 %   MCV 81.6 80.0 - 100.0 fL   MCH 25.1 (L) 26.0 - 34.0 pg   MCHC 30.8 30.0 - 36.0 g/dL   RDW 11.9 (H) 14.7 - 82.9 %   Platelets 238 150 - 400 K/uL   nRBC 0.0 0.0 - 0.2 %    Comment: Performed at [facility], [address] [place], [address] [place]  Basic metabolic panel     Status: Abnormal   Collection Time: 05/09/21 11:00 AM  Result Value Ref Range   Sodium 136 135 - 145 mmol/L   Potassium 3.9 3.5 - 5.1 mmol/L   Chloride 101 98 - 111 mmol/L   CO2 26 22 - 32 mmol/L   Glucose, Bld 191 (H) 70 - 99 mg/dL    Comment: Glucose reference range applies only to samples taken after fasting for at least 8 hours.   BUN 10 6 - 20 mg/dL   Creatinine, Ser 86578 0.61 - 1.24 mg/dL   Calcium 8.7 (L) 8.9 - 10.3 mg/dL   GFR, Estimated 05/11/21 4.69 mL/min    Comment: (NOTE) Calculated using the CKD-EPI Creatinine Equation (2021)    Anion gap 9 5 - 15    Comment: Performed at [facility], [address]  [place], [address] [place]   No results found.  Review of Systems  Musculoskeletal:  Positive for arthralgias.  All other systems reviewed and are negative.  Blood pressure 134/85, pulse 79, temperature 98.3 F (36.8 C), temperature source Oral, resp. rate 18,  height 5\' 10"  (1.778 m), weight (!) 143 kg, SpO2 97 %. Physical Exam Vitals reviewed.  HENT:     Head: Normocephalic.     Nose: Nose normal.  Eyes:     Pupils: Pupils are equal, round, and reactive to light.  Cardiovascular:     Rate and Rhythm: Normal rate.     Pulses: Normal pulses.  Pulmonary:     Effort: Pulmonary effort is normal.  Abdominal:     General: Abdomen is flat.  Musculoskeletal:     Cervical back: Normal range of motion.  Skin:    General: Skin is warm.     Capillary Refill: Capillary refill takes less than 2 seconds.  Neurological:     General: No focal deficit present.     Mental Status: [name] alert.  Psychiatric:        Mood and Affect: Mood normal.   Ortho exam demonstrates right knee with 0 degrees extension and 115 degrees of knee flexion.  Pain increased with knee flexion.  Tenderness over the medial and lateral joint lines primarily.  No tenderness over the quad tendon, patellar tendon, patella.  [name] able to form straight leg raise without extensor lag.  No calf tenderness.  Negative Homans' sign.  No pain with hip range of motion.  No groin pain elicited on exam.  Stable to anterior/posterior drawer.  No instability or laxity to varus/valgus stress.  Assessment/Plan Patient is a 40 year old male who presents for evaluation of right knee pain.  He has history of bilateral knees bothering him over the last several months without any history of injury.  He has had MRIs of both knees with no arthroscopically treatable pathology that was found in either knee aside from a small vertical tear of the medial meniscus.  This was viewed with the patient today and the meniscal tear appears underwhelming.  Overall,  patient does have chondral thinning of the medial compartment as well as an overall diminutive appearance of the medial meniscus when compared to the lateral meniscus.  Suspect that a lot of patient's pain is stemming from the degeneration of his medial meniscus and medial compartment cartilage.  Discussed options available to patient.  He would like to proceed with arthroscopy.  Arthroscopy was discussed and really seems that it is a 50-50 shot whether or not arthroscopic intervention provides lasting relief for him.  He understands this risk but he has tried everything else and he is at the point where he feels he wants to try anything that has a chance of helping his knee pain.  Plan to proceed with right knee arthroscopy.  Risks and benefits were discussed with the patient today.  Plan to follow-up after procedure  20, MD 05/10/2021, 6:58 AM

## 2021-05-10 NOTE — Addendum Note (Signed)
Addendum  created 05/10/21 1042 by Reign Dziuba, Jewel Baize, CRNA   Intraprocedure Meds edited

## 2021-05-10 NOTE — Telephone Encounter (Signed)
Patient's wife Shanda Bumps called regarding Joseph [name]'s Taltz medication.  Shanda Bumps states Charlie had surgery this morning and noticed it was on his medication list.  Patient requested a return call.

## 2021-05-10 NOTE — Transfer of Care (Signed)
Immediate Anesthesia Transfer of Care Note  Patient: Joseph [name]  Procedure(s) Performed: ARTHROSCOPY KNEE partial medial meniscectomy (Right: Knee)  Patient Location: PACU  Anesthesia Type:General  Level of Consciousness: awake, alert , oriented and patient cooperative  Airway & Oxygen Therapy: Patient Spontanous Breathing and Patient connected to face mask oxygen  Post-op Assessment: Report given to RN and Post -op Vital signs reviewed and stable  Post vital signs: Reviewed and stable  Last Vitals:  Vitals Value Taken Time  BP 128/79 05/10/21 0828  Temp    Pulse 103 05/10/21 0830  Resp    SpO2 97 % 05/10/21 0830  Vitals shown include unvalidated device data.  Last Pain:  Vitals:   05/10/21 0623  TempSrc: Oral  PainSc: 7       Patients Stated Pain Goal: 0 (05/10/21 8638)  Complications: No notable events documented.

## 2021-05-10 NOTE — Anesthesia Procedure Notes (Signed)
Procedure Name: LMA Insertion Date/Time: 05/10/2021 7:35 AM Performed by: Sheryn Bison, CRNA Pre-anesthesia Checklist: Patient identified, Emergency Drugs available, Suction available and Patient being monitored Patient Re-evaluated:Patient Re-evaluated prior to induction Oxygen Delivery Method: Circle System Utilized Preoxygenation: Pre-oxygenation with 100% oxygen Induction Type: IV induction Ventilation: Mask ventilation without difficulty LMA: LMA inserted LMA Size: 4.0 Number of attempts: 1 Airway Equipment and Method: bite block Placement Confirmation: positive ETCO2 Tube secured with: Tape Dental Injury: Teeth and Oropharynx as per pre-operative assessment

## 2021-05-14 ENCOUNTER — Encounter (HOSPITAL_BASED_OUTPATIENT_CLINIC_OR_DEPARTMENT_OTHER): Payer: Self-pay | Admitting: Orthopedic Surgery

## 2021-05-14 ENCOUNTER — Other Ambulatory Visit (HOSPITAL_COMMUNITY): Payer: Self-pay

## 2021-05-14 NOTE — Op Note (Signed)
Joseph [name], GASSMAN MEDICAL RECORD NO: [id number] ACCOUNT NO: [id number] DATE OF BIRTH: 1980-12-31 FACILITY: [facility] LOCATION: [facility] PHYSICIAN: Graylin Shiver. August Saucer, MD  Operative Report   DATE OF PROCEDURE: 05/10/2021  PREOPERATIVE DIAGNOSIS:  Right knee medial meniscal tear.  POSTOPERATIVE DIAGNOSIS:  Right knee medial meniscal tear.  PROCEDURE:  Right knee arthroscopy with partial medial meniscectomy and debridement of some fraying of the posterior horn of the lateral meniscus.  SURGEON:  Cammy Copa, MD  ASSISTANT:  None.  INDICATIONS:  The patient is a patient with right knee pain.  MRI scan showed possible vertical tear through the medial meniscus, presents now for operative management after explanation of risks and benefits.  DESCRIPTION OF PROCEDURE:  The patient was brought to the operating room where general anesthetic was induced.  Preoperative antibiotics were administered.  Timeout was called.  Right leg pre-scrubbed with alcohol and Betadine, allowed to air dry,  prepped with DuraPrep solution and draped in a sterile manner.  Anterior inferolateral and anterior inferomedial portals were anesthetized with Marcaine with epinephrine.  Anterior inferolateral portal was established, anterior inferomedial portal  established under direct visualization.  Patellofemoral compartment was intact.  No loose bodies in the medial or lateral gutter.  The patient had full range of motion preoperatively.  ACL and PCL intact.  Lateral compartment had some mild grade I  changes on the lateral tibial plateau, which was debrided using a shaver.  The lateral femoral condyle was intact.  Lateral meniscus was intact except for some fraying at the posterior horn of the lateral meniscus which was debrided with a shaver.   Medial meniscus was very carefully inspected, undersurface and superior surface.  No vertical tear was visualized.  Extensive probing was performed.  There was some fraying  involving about 20% of the anterior, posterior width of that meniscus in the  region of some induration around the meniscus.  This region was debrided back to a stable rim using a combination of basket punch and shaver.  No instability of the meniscus was present.  Following debridement, thorough irrigation of the joint was  performed.  Instruments were removed.  Portals were closed using 3-0 nylon.  A solution of Marcaine, morphine, clonidine injected into the knee for postoperative pain relief.  The patient tolerated the procedure well without immediate complications.   Water proof dressings and Ace wrap applied.   SHW D: 05/10/2021 8:32:35 am T: 05/10/2021 8:54:00 am  JOB: [id number]/ [id number]

## 2021-05-14 NOTE — Telephone Encounter (Signed)
Payment information collected and provided to pharmacy. Therigy finalized and scheduled to ship to clinic tomorrow. Nothing further needed at this time.

## 2021-05-15 NOTE — Telephone Encounter (Signed)
Taltz received in the office via courier from [employer] and has been placed in the fridge.

## 2021-05-16 ENCOUNTER — Other Ambulatory Visit (HOSPITAL_COMMUNITY): Payer: Self-pay

## 2021-05-17 ENCOUNTER — Encounter: Payer: Self-pay | Admitting: Orthopedic Surgery

## 2021-05-17 ENCOUNTER — Ambulatory Visit: Payer: BC Managed Care – PPO | Admitting: Rheumatology

## 2021-05-17 ENCOUNTER — Other Ambulatory Visit (HOSPITAL_COMMUNITY): Payer: Self-pay

## 2021-05-17 ENCOUNTER — Ambulatory Visit (INDEPENDENT_AMBULATORY_CARE_PROVIDER_SITE_OTHER): Payer: BC Managed Care – PPO | Admitting: Orthopedic Surgery

## 2021-05-17 DIAGNOSIS — M23303 Other meniscus derangements, unspecified medial meniscus, right knee: Secondary | ICD-10-CM

## 2021-05-17 NOTE — Progress Notes (Signed)
Post-Op Visit Note   Patient: Joseph [name]           Date of Birth: 10/26/1981           MRN: [id number] Visit Date: 05/17/2021 PCP: Lavada Mesi, MD   Assessment & Plan:  Chief Complaint:  Chief Complaint  Patient presents with   Right Knee - Routine Post Op   Visit Diagnoses:  1. 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  There was diffuse degeneration through about 20% of the anterior and posterior with of the medial meniscus that was resected to a stable rim.  Patient reports 50% improvement of his right knee pain compared with prior to surgery and he notices less popping.  He feels he is doing much better.  He does have some continued pain and stiffness but overall he feels improved.  On exam he has 0 degrees extension and 110 degrees of knee flexion.  Incisions are healing well with sutures intact.  Sutures were removed and replaced with Steri-Strips.  [name] able to perform straight leg raise without extensor lag.  No calf tenderness.  Negative Homans' sign.  He denies any complaints of shortness of breath or chest pain.  Plan to start stationary bike and continue with straight leg raises every day in order to optimize quadricep strength and knee range of motion.  [name] going to start Altamease Oiler which is a humanized monoclonal antibody for psoriasis and psoriatic arthritis.  Okay to start this 2 weeks out from procedure which will be next Friday.  Follow-up in 4 weeks for clinical recheck with Dr. August Saucer.  Follow-Up Instructions: No follow-ups on file.   Orders:  No orders of the defined types were placed in this encounter.  No orders of the defined types were placed in this encounter.   Imaging: No results found.  PMFS History: Patient Active Problem List   Diagnosis Date Noted   Elbow dislocation, right, initial encounter 03/06/2020    Closed disp fx of right olecranon with intraartic exten w malunion 02/14/2020   Morbid obesity with BMI of 45.0-49.9, adult (HCC) 11/25/2019   Hair loss 12/31/2017   Varicose veins of both lower extremities 07/26/2017   Iron deficiency anemia 09/21/2014   Psoriatic arthritis (HCC) 09/21/2014   ESSENTIAL HYPERTENSION, MALIGNANT 02/18/2009   Nonspecific (abnormal) findings on radiological and other examination of body structure 02/16/2009   CHEST XRAY, ABNORMAL 02/16/2009   Past Medical History:  Diagnosis Date   Acute sinus infection 11/07/2014   Arthritis    Blood transfusion without reported diagnosis    At [facility]    Difficult intubation 01/13/2020   difficult airway in OR (per Dr Rollene Fare)   Iron deficiency anemia 09/21/2014   Psoriasis    Psoriatic arthritis (HCC)    Psoriatic arthritis (HCC)    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 Antoine Poche 02/2009 with echo, as needed f/u   Torn meniscus    right knee    Family History  Problem Relation Age of Onset   Pulmonary fibrosis Mother    Hypertension Mother    Pancreatic cancer Maternal Grandmother    Rheum arthritis Paternal Aunt    Cancer Paternal Grandmother    Breast cancer Paternal Grandmother    COPD Paternal  Grandfather    CAD Paternal Grandfather    Colon cancer Neg Hx    Prostate cancer Neg Hx    Diabetes Neg Hx    Thyroid disease Neg Hx    Liver cancer Neg Hx    Stomach cancer Neg Hx    Rectal cancer Neg Hx     Past Surgical History:  Procedure Laterality Date   COLONOSCOPY WITH ESOPHAGOGASTRODUODENOSCOPY (EGD)     EXTERNAL FIXATION REMOVAL Right 03/22/2020   Procedure: REMOVAL EXTERNAL FIXATION ARM;  Surgeon: Roby Lofts, MD;  Location: [facility];  Service: Orthopedics;  Laterality: Right;   HERNIA REPAIR  1994   umbicial   INNER EAR SURGERY Left    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, had surgery to repair  hearing   KNEE ARTHROSCOPY Right 05/10/2021   Procedure: ARTHROSCOPY KNEE partial medial meniscectomy;  Surgeon: Cammy Copa, MD;  Location: [facility];  Service: Orthopedics;  Laterality: Right;   LIGAMENT REPAIR Right 01/13/2020   Procedure: COLLATERAL LIGAMENT REPAIRS;  Surgeon: Mack Hook, MD;  Location: [facility];  Service: Orthopedics;  Laterality: Right;   ORIF ELBOW FRACTURE Right 02/17/2020   Procedure: REVISION OPEN REDUCTION INTERNAL FIXATION (ORIF) ELBOW/OLECRANON FRACTURE;  Surgeon: Roby Lofts, MD;  Location: [facility];  Service: Orthopedics;  Laterality: Right;   ORIF ULNAR FRACTURE Right 01/13/2020   Procedure: OPEN TREATMENT OF COMPLEX RIGHT PROXIMAL ULNA FRACTURE;  Surgeon: Mack Hook, MD;  Location: [facility];  Service: Orthopedics;  Laterality: Right;  PROCEDURE: OPEN TREATMENT OF COMPLEX RIGHT PROXIMAL ULNA FRACTURE WITH POSSIBLE COLLATERAL LIGAMENT REPAIRS LENGTH OF SURGERY: 2.5 HOURSMAC + REGIONAL BLOCK   Social History   Occupational History   Not on file  Tobacco Use   Smoking status: Never   Smokeless tobacco: Never  Vaping Use   Vaping Use: Never used  Substance and Sexual Activity   Alcohol use: No   Drug use: No   Sexual activity: Yes

## 2021-05-17 NOTE — Telephone Encounter (Signed)
Taltz received from [facility] by clinic on 05/15/21. Placed in refrigerator.   Patient will need clearance to start Taltz after his recent surgery  Chesley Mires, PharmD, MPH Clinical Pharmacist (Rheumatology and Pulmonology)

## 2021-05-18 DIAGNOSIS — S83241A Other tear of medial meniscus, current injury, right knee, initial encounter: Secondary | ICD-10-CM

## 2021-05-22 ENCOUNTER — Telehealth: Payer: Self-pay

## 2021-05-22 ENCOUNTER — Other Ambulatory Visit (HOSPITAL_COMMUNITY): Payer: Self-pay

## 2021-05-22 NOTE — Telephone Encounter (Signed)
Patient called stating he had his follow-up appointment with Dr. August Saucer on 05/17/21 and told him it was okay to start Taltz medication.  Patient requested a return call.

## 2021-05-22 NOTE — Telephone Encounter (Signed)
Per note on 05/17/2021 by Dr. August Saucer: Molli Knock to start this 2 weeks out from procedure which will be next Friday. Patient advised Emi Belfast is not in the office on Fridays and is only in the office in the mornings Monday-Thursday. Patient scheduled for 05/27/2021 at 10:30 am.

## 2021-05-24 ENCOUNTER — Other Ambulatory Visit (HOSPITAL_COMMUNITY): Payer: Self-pay

## 2021-05-27 ENCOUNTER — Other Ambulatory Visit (HOSPITAL_COMMUNITY): Payer: Self-pay

## 2021-05-27 ENCOUNTER — Other Ambulatory Visit: Payer: Self-pay

## 2021-05-27 ENCOUNTER — Ambulatory Visit: Payer: BC Managed Care – PPO | Admitting: Pharmacist

## 2021-05-27 DIAGNOSIS — L409 Psoriasis, unspecified: Secondary | ICD-10-CM

## 2021-05-27 DIAGNOSIS — L4050 Arthropathic psoriasis, unspecified: Secondary | ICD-10-CM

## 2021-05-27 DIAGNOSIS — Z7189 Other specified counseling: Secondary | ICD-10-CM

## 2021-05-27 DIAGNOSIS — Z79899 Other long term (current) drug therapy: Secondary | ICD-10-CM

## 2021-05-27 MED ORDER — TALTZ 80 MG/ML ~~LOC~~ SOAJ
SUBCUTANEOUS | 1 refills | Status: DC
  Filled 2021-05-27: qty 2, fill #0

## 2021-05-27 MED ORDER — PREDNISONE 5 MG PO TABS: ORAL_TABLET | ORAL | 0 refills | Status: DC

## 2021-05-27 MED ORDER — TALTZ 80 MG/ML ~~LOC~~ SOAJ
SUBCUTANEOUS | 1 refills | Status: DC
  Filled 2021-05-27: qty 2, fill #0
  Filled 2021-06-10: qty 2, 28d supply, fill #0
  Filled 2021-07-30 – 2021-08-02 (×2): qty 2, 28d supply, fill #1

## 2021-05-27 NOTE — Progress Notes (Signed)
Pharmacy Note  Subjective:   Patient presents to clinic today with his wife, Shanda Bumps, to receive first dose of Taltz for PsA and psoriasis. He was previously taking Cosentyx and had waning response. His last Cosentyx dose was 04/19/21. Since then, he had right knee arthroscopy on 05/10/21 with Dr. August Saucer and he was cleared to start Taltz approximately 2 weeks after this procedure. He states that he is having significant inflammation and pain in hands and is requesting prednisone course while starting Taltz.  He has previously tried Enbrel, Humira with inadequate clinical response. He also received Remicade infusion but these were discontinued d/t the cost and time required to receive infusions. He was previously taking methotrexate but this was stopped d/t surgeries - it was never resumed once he was cleared. After his surgery in early 2021, he resume Cosentyx monotherapy.  Patient running a fever or have signs/symptoms of infection? No  Patient currently on antibiotics for the treatment of infection? No  Patient have any upcoming invasive procedures/surgeries? No  Objective: CMP     Component Value Date/Time   NA 136 05/09/2021 1100   NA 139 03/11/2019 1354   NA 138 10/25/2014 1011   K 3.9 05/09/2021 1100   K 3.2 (L) 10/25/2014 1011   CL 101 05/09/2021 1100   CO2 26 05/09/2021 1100   CO2 24 10/25/2014 1011   GLUCOSE 191 (H) 05/09/2021 1100   GLUCOSE 103 10/25/2014 1011   BUN 10 05/09/2021 1100   BUN 10 03/11/2019 1354   BUN 13.4 10/25/2014 1011   CREATININE 1.01 05/09/2021 1100   CREATININE 0.99 04/19/2021 1217   CREATININE 1.0 10/25/2014 1011   CALCIUM 8.7 (L) 05/09/2021 1100   CALCIUM 8.5 10/25/2014 1011   PROT 7.6 04/19/2021 1217   PROT 7.1 03/11/2019 1354   PROT 7.0 10/25/2014 1011   ALBUMIN 4.0 12/21/2020 1129   ALBUMIN 3.8 (L) 03/11/2019 1354   ALBUMIN 3.7 10/25/2014 1011   AST 16 04/19/2021 1217   AST 18 10/25/2014 1011   ALT 18 04/19/2021 1217   ALT 16 10/25/2014 1011    ALKPHOS 108 12/21/2020 1129   ALKPHOS 83 10/25/2014 1011   BILITOT 0.5 04/19/2021 1217   BILITOT 0.4 03/11/2019 1354   BILITOT 0.43 10/25/2014 1011   GFRNONAA >60 05/09/2021 1100   GFRNONAA 97 04/19/2021 1217   GFRAA 112 04/19/2021 1217    CBC    Component Value Date/Time   WBC 10.8 (H) 05/09/2021 1100   RBC 5.49 05/09/2021 1100   HGB 13.8 05/09/2021 1100   HGB 11.8 (L) 03/11/2019 1354   HGB 14.0 02/02/2015 0843   HCT 44.8 05/09/2021 1100   HCT 38.5 03/11/2019 1354   HCT 43.0 02/02/2015 0843   PLT 238 05/09/2021 1100   PLT 202 03/11/2019 1354   MCV 81.6 05/09/2021 1100   MCV 79 03/11/2019 1354   MCV 84.3 02/02/2015 0843   MCH 25.1 (L) 05/09/2021 1100   MCHC 30.8 05/09/2021 1100   RDW 15.6 (H) 05/09/2021 1100   RDW 17.2 (H) 03/11/2019 1354   RDW 18.7 (H) 02/02/2015 0843   LYMPHSABS 2,416 04/19/2021 1217   LYMPHSABS 2.5 03/11/2019 1354   LYMPHSABS 2.1 02/02/2015 0843   MONOABS 0.8 04/03/2017 1035   MONOABS 0.7 02/02/2015 0843   EOSABS 257 04/19/2021 1217   EOSABS 0.2 03/11/2019 1354   BASOSABS 69 04/19/2021 1217   BASOSABS 0.1 03/11/2019 1354   BASOSABS 0.0 02/02/2015 0843    Baseline Immunosuppressant Therapy Labs TB GOLD Quantiferon TB  Gold Latest Ref Rng & Units 11/28/2020  Quantiferon TB Gold Plus NEGATIVE NEGATIVE   Hepatitis Panel   HIV Lab Results  Component Value Date   HIV NON-REACTIVE 07/23/2018   Immunoglobulins Immunoglobulin Electrophoresis Latest Ref Rng & Units 12/21/2020  IgA  47 - 310 mg/dL 349(Z)  IgG 791 - 5,056 mg/dL -  IgM 50 - 979 mg/dL -   SPEP Serum Protein Electrophoresis Latest Ref Rng & Units 04/19/2021  Total Protein 6.1 - 8.1 g/dL 7.6  Albumin 3.8 - 4.8 g/dL -  Alpha-1 0.2 - 0.3 g/dL -  Alpha-2 0.5 - 0.9 g/dL -  Beta Globulin 0.4 - 0.6 g/dL -  Beta 2 0.2 - 0.5 g/dL -  Gamma Globulin 0.8 - 1.7 g/dL -   Chest x-ray: 4/8/016 - wnl  Assessment/Plan:  Demonstrated proper injection technique with Taltz demo device   Patient's wife able to demonstrate proper injection technique using the teach back method.  Patient's wife injected in the right and left lower abdomen with:  Pharmacy-Supplied Medication: Taltz 80mg /mL auto-injector pen x 2 pens NDC: Lot: [id number] Expiration: 01/05/2023  Patient tolerated well.  Observed for 30 mins in office for adverse reaction and none noted.   Patient is to return in 1 month for labs and 6-8 weeks for follow-up appointment.  Standing orders for CBC and CMP remain in place.   Taltz approved through insurance.  Prescription sent to [facility] with copay card information.  Patient provided with Week 2 and Week 4 doses that were shipped from [facility]  Dose will be: Taltz 160mg  at Week 0 (received in clinic today), then 80mg  at [facility] 2, 4, 6, 8, 10, and 12. Maintenance dose of Taltz 80mg  every 4 weeks thereafter.  Prednisone taper for 20mg  x 4 days, 15mg  x 4 days, 10mg  x 4 days, 5 mg x 4 days sent to local pharmacy  After discussion with , PA-C, he will remain off of methotrexate for now until response to Taltz monotherapy can be assessed. If additional therapy is needed, we will consider adding methotrexate back to regimen.  All questions encouraged and answered.  Instructed patient to call with any further questions or concerns.  , PharmD, MPH Clinical Pharmacist (Rheumatology and Pulmonology)  05/27/2021 8:49 AM

## 2021-05-27 NOTE — Patient Instructions (Signed)
Your next Taltz dose is due on 8/1, 8/15, 8/29, 9/12, 9/26, 10/10, 11/7 and every 4 weeks thereafter  Your prescription will be shipped from [facility]. Their phone number is [phone] Please call to schedule shipment and confirm address. They will mail medication to your home.  Your copay should be affordable. If you call the pharmacy and it is not affordable, please double-check that they are billing through your copay card as secondary coverage. That copay card information is: RXBIN: [id number] PCN: [id number] GRP: [id number] ID: [id number] Expiration Date: 11/10/2023  Labs are due in 1 month then every 3 months.  Remember the 5 C's: COUNTER - leave on the counter at least 30 minutes but up to overnight to bring medication to room temperature. This may help prevent stinging COLD - place something cold (like an ice gel pack or cold water bottle) on the injection site just before cleansing with alcohol. This may help reduce pain CLARITIN - use Claritin (generic name is loratadine) for the first two weeks of treatment or the day of, the day before, and the day after injecting. This will help to minimize injection site reactions CORTISONE CREAM - apply if injection site is irritated and itching CALL ME - if injection site reaction is bigger than the size of your fist, looks infected, blisters, or if you develop hives

## 2021-05-27 NOTE — Telephone Encounter (Signed)
Patient started Taltz today, 05/27/21 after clearance by Dr. August Saucer. Received Week 0 dose of 160mg  as two divided injections  , PharmD, MPH Clinical Pharmacist (Rheumatology and Pulmonology)

## 2021-06-04 ENCOUNTER — Other Ambulatory Visit (HOSPITAL_COMMUNITY): Payer: Self-pay

## 2021-06-06 ENCOUNTER — Other Ambulatory Visit (HOSPITAL_COMMUNITY): Payer: Self-pay

## 2021-06-10 ENCOUNTER — Other Ambulatory Visit (HOSPITAL_COMMUNITY): Payer: Self-pay

## 2021-06-10 ENCOUNTER — Encounter: Payer: Self-pay | Admitting: Family Medicine

## 2021-06-10 ENCOUNTER — Ambulatory Visit: Payer: BC Managed Care – PPO

## 2021-06-10 DIAGNOSIS — R42 Dizziness and giddiness: Secondary | ICD-10-CM

## 2021-06-10 DIAGNOSIS — R000 Tachycardia, unspecified: Secondary | ICD-10-CM

## 2021-06-10 NOTE — Progress Notes (Unsigned)
Patient enrolled for [employer] to mail a 14 day ZIO XT monitor to address on file.  Monitor was marked lost in [place] /never returned

## 2021-06-12 NOTE — Telephone Encounter (Signed)
Injection site reactions are the most common side effect seen with patients on taltz.   Please check to see if this patient received the new formulation of Taltz.    Please advise the patient to apply clobetasol cream topically to the injection site reaction to alleviate some of the pain, swelling, and itching.  Please also clarify if he has tried taking an antihistamine? I would recommend taking benadryl tonight at bedtime and he can take zyrtec for the next 3-4 days if he does not experience daytime drowsiness.

## 2021-06-17 ENCOUNTER — Ambulatory Visit (INDEPENDENT_AMBULATORY_CARE_PROVIDER_SITE_OTHER): Payer: BC Managed Care – PPO | Admitting: Orthopedic Surgery

## 2021-06-17 ENCOUNTER — Other Ambulatory Visit: Payer: Self-pay

## 2021-06-17 DIAGNOSIS — Z9889 Other specified postprocedural states: Secondary | ICD-10-CM

## 2021-06-23 ENCOUNTER — Encounter: Payer: Self-pay | Admitting: Orthopedic Surgery

## 2021-06-23 NOTE — Progress Notes (Signed)
Post-Op Visit Note   Patient: Joseph [name]           Date of Birth: [birth year]           MRN: [id number] Visit Date: 06/17/2021 PCP: Lavada Mesi, MD   Assessment & Plan:  Chief Complaint:  Chief Complaint  Patient presents with   Right Knee - Routine Post Op    05/10/21 right knee scope with PMM   Visit Diagnoses:  1. Status post arthroscopy of right knee     Plan: Patient is a 39 year old male who presents s/p right knee arthroscopy with debridement on 05/10/2021.  Reports that he continues to improve.  He has noticed a lot of improvement since starting Taltz, his new medication for psoriatic arthritis.  He denies any chest pain, shortness of breath.  No calf pain.  Most of his exercises walking his new puppy.  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  On exam he has well-healed incisions without any evidence of infection or dehiscence.  No knee effusion.  No calf tenderness.  Range of motion of the knee from 0 to 115 degrees.  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  If he is doing well at that time, release him for full activity.  Follow-Up Instructions: No follow-ups on file.   Orders:  No orders of the defined types were placed in this encounter.  No orders of the defined types were placed in this encounter.   Imaging: No results found.  PMFS History: Patient Active Problem List   Diagnosis Date Noted   Acute medial meniscus tear of right knee    Elbow dislocation, right, initial encounter 03/06/2020   Closed disp fx of right olecranon with intraartic exten w malunion 02/14/2020   Morbid obesity with BMI of 45.0-49.9, adult (HCC) 11/25/2019   Hair loss 12/31/2017   Varicose veins of both lower extremities 07/26/2017   Iron deficiency anemia 09/21/2014   Psoriatic arthritis (HCC) 09/21/2014   ESSENTIAL HYPERTENSION, MALIGNANT 02/18/2009   Nonspecific (abnormal) findings on radiological  and other examination of body structure 02/16/2009   CHEST XRAY, ABNORMAL 02/16/2009   Past Medical History:  Diagnosis Date   Acute sinus infection 11/07/2014   Arthritis    Blood transfusion without reported diagnosis    At [facility]    Difficult intubation 01/13/2020   difficult airway in OR (per Dr Rollene Fare)   Iron deficiency anemia 09/21/2014   Psoriasis    Psoriatic arthritis (HCC)    Psoriatic arthritis (HCC)    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 Antoine Poche 02/2009 with echo, as needed f/u   Torn meniscus    right knee    Family History  Problem Relation Age of Onset   Pulmonary fibrosis Mother    Hypertension Mother    Pancreatic cancer Maternal Grandmother    Rheum arthritis Paternal Aunt    Cancer Paternal Grandmother    Breast cancer Paternal Grandmother    COPD Paternal Grandfather    CAD Paternal Grandfather    Colon cancer Neg Hx    Prostate cancer Neg Hx    Diabetes Neg Hx    Thyroid disease Neg Hx    Liver cancer Neg Hx    Stomach cancer Neg Hx    Rectal cancer Neg Hx  Past Surgical History:  Procedure Laterality Date   COLONOSCOPY WITH ESOPHAGOGASTRODUODENOSCOPY (EGD)     EXTERNAL FIXATION REMOVAL Right 03/22/2020   Procedure: REMOVAL EXTERNAL FIXATION ARM;  Surgeon: Roby Lofts, MD;  Location: [facility];  Service: Orthopedics;  Laterality: Right;   HERNIA REPAIR  1994   umbicial   INNER EAR SURGERY Left    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, 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   KNEE ARTHROSCOPY Right 05/10/2021   Procedure: ARTHROSCOPY KNEE partial medial meniscectomy;  Surgeon: Cammy Copa, MD;  Location: [facility];  Service: Orthopedics;  Laterality: Right;   LIGAMENT REPAIR Right 01/13/2020   Procedure: COLLATERAL LIGAMENT REPAIRS;  Surgeon: Mack Hook, MD;  Location: [facility];  Service: Orthopedics;  Laterality:  Right;   ORIF ELBOW FRACTURE Right 02/17/2020   Procedure: REVISION OPEN REDUCTION INTERNAL FIXATION (ORIF) ELBOW/OLECRANON FRACTURE;  Surgeon: Roby Lofts, MD;  Location: [facility];  Service: Orthopedics;  Laterality: Right;   ORIF ULNAR FRACTURE Right 01/13/2020   Procedure: OPEN TREATMENT OF COMPLEX RIGHT PROXIMAL ULNA FRACTURE;  Surgeon: Mack Hook, MD;  Location: [facility];  Service: Orthopedics;  Laterality: Right;  PROCEDURE: OPEN TREATMENT OF COMPLEX RIGHT PROXIMAL ULNA FRACTURE WITH POSSIBLE COLLATERAL LIGAMENT REPAIRS LENGTH OF SURGERY: 2.5 HOURSMAC + REGIONAL BLOCK   Social History   Occupational History   Not on file  Tobacco Use   Smoking status: Never   Smokeless tobacco: Never  Vaping Use   Vaping Use: Never used  Substance and Sexual Activity   Alcohol use: No   Drug use: No   Sexual activity: Yes

## 2021-06-25 ENCOUNTER — Other Ambulatory Visit (HOSPITAL_COMMUNITY): Payer: Self-pay

## 2021-06-28 ENCOUNTER — Other Ambulatory Visit (HOSPITAL_COMMUNITY): Payer: Self-pay

## 2021-07-02 ENCOUNTER — Other Ambulatory Visit (HOSPITAL_COMMUNITY): Payer: Self-pay

## 2021-07-05 NOTE — Progress Notes (Deleted)
Office Visit Note  Patient: Joseph [name]             Date of Birth: 1981/02/03           MRN: [id number]             PCP: Eunice Blase, MD Referring: Eunice Blase, MD Visit Date: 07/19/2021 Occupation: _0 @  Subjective:  No chief complaint on file.   History of Present Illness: Joseph [name] is a 40 y.o. male ***   Activities of Daily Living:  Patient reports morning stiffness for *** {minute/hour:19697}.   Patient {ACTIONS;DENIES/REPORTS:21021675::"Denies"} nocturnal pain.  Difficulty dressing/grooming: {ACTIONS;DENIES/REPORTS:21021675::"Denies"} Difficulty climbing stairs: {ACTIONS;DENIES/REPORTS:21021675::"Denies"} Difficulty getting out of chair: {ACTIONS;DENIES/REPORTS:21021675::"Denies"} Difficulty using hands for taps, buttons, cutlery, and/or writing: {ACTIONS;DENIES/REPORTS:21021675::"Denies"}  No Rheumatology ROS completed.   PMFS History:  Patient Active Problem List   Diagnosis Date Noted   Acute medial meniscus tear of right knee    Elbow dislocation, right, initial encounter 03/06/2020   Closed disp fx of right olecranon with intraartic exten w malunion 02/14/2020   Morbid obesity with BMI of 45.0-49.9, adult ([place]) 11/25/2019   Hair loss 12/31/2017   Varicose veins of both lower extremities 07/26/2017   Iron deficiency anemia 09/21/2014   Psoriatic arthritis ([place]) 09/21/2014   ESSENTIAL HYPERTENSION, MALIGNANT 02/18/2009   Nonspecific (abnormal) findings on radiological and other examination of body structure 02/16/2009   CHEST XRAY, ABNORMAL 02/16/2009    Past Medical History:  Diagnosis Date   Acute sinus infection 11/07/2014   Arthritis    Blood transfusion without reported diagnosis    At [facility]    Difficult intubation 01/13/2020   difficult airway in OR (per Dr Laurey Arrow)   Iron deficiency anemia 09/21/2014   Psoriasis    Psoriatic arthritis (HCC)    Psoriatic arthritis (HCC)    Right-sided aortic arch present on  imaging    right sided aortic arch noted on 02/16/09 and 11/23/13 chest xrays; seen by cardiologist Dr. Percival Spanish 02/2009 with echo, as needed f/u   Torn meniscus    right knee    Family History  Problem Relation Age of Onset   Pulmonary fibrosis Mother    Hypertension Mother    Pancreatic cancer Maternal Grandmother    Rheum arthritis Paternal Aunt    Cancer Paternal Grandmother    Breast cancer Paternal Grandmother    COPD Paternal Grandfather    CAD Paternal Grandfather    Colon cancer Neg Hx    Prostate cancer Neg Hx    Diabetes Neg Hx    Thyroid disease Neg Hx    Liver cancer Neg Hx    Stomach cancer Neg Hx    Rectal cancer Neg Hx    Past Surgical History:  Procedure Laterality Date   COLONOSCOPY WITH ESOPHAGOGASTRODUODENOSCOPY (EGD)     EXTERNAL FIXATION REMOVAL Right 03/22/2020   Procedure: REMOVAL EXTERNAL FIXATION ARM;  Surgeon: Shona Needles, MD;  Location: [place];  Service: Orthopedics;  Laterality: Right;   HERNIA REPAIR  1994   umbicial   INNER EAR SURGERY Left    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, 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   KNEE ARTHROSCOPY Right 05/10/2021   Procedure: ARTHROSCOPY KNEE partial medial meniscectomy;  Surgeon: Meredith Pel, MD;  Location: [place];  Service: Orthopedics;  Laterality: Right;   LIGAMENT REPAIR Right 01/13/2020   Procedure: COLLATERAL LIGAMENT REPAIRS;  Surgeon: Milly Jakob, MD;  Location: [place];  Service: Orthopedics;  Laterality: Right;  ORIF ELBOW FRACTURE Right 02/17/2020   Procedure: REVISION OPEN REDUCTION INTERNAL FIXATION (ORIF) ELBOW/OLECRANON FRACTURE;  Surgeon: Shona Needles, MD;  Location: [place];  Service: Orthopedics;  Laterality: Right;   ORIF ULNAR FRACTURE Right 01/13/2020   Procedure: OPEN TREATMENT OF COMPLEX RIGHT PROXIMAL ULNA FRACTURE;  Surgeon: Milly Jakob, MD;  Location: [place];  Service: Orthopedics;  Laterality: Right;  PROCEDURE: OPEN TREATMENT OF COMPLEX  RIGHT PROXIMAL ULNA FRACTURE WITH POSSIBLE COLLATERAL LIGAMENT REPAIRS LENGTH OF SURGERY: 2.5 [place] + REGIONAL BLOCK   Social History   Social History Narrative   Right handed   Caffeien use:1 soda per day   Immunization History  Administered Date(s) Administered   MMR 02/12/2001   PFIZER Comirnaty(Gray Top)Covid-19 Tri-Sucrose Vaccine 09/21/2020, 10/12/2020   Pneumococcal Polysaccharide-23 11/10/2012   Td 08/17/2001     Objective: Vital Signs: There were no vitals taken for this visit.   Physical Exam   Musculoskeletal Exam: ***  CDAI Exam: CDAI Score: -- Patient Global: --; Provider Global: -- Swollen: --; Tender: -- Joint Exam 07/19/2021   No joint exam has been documented for this visit   There is currently no information documented on the homunculus. Go to the Rheumatology activity and complete the homunculus joint exam.  Investigation: No additional findings.  Imaging: No results found.  Recent Labs: Lab Results  Component Value Date   WBC 10.8 (H) 05/09/2021   HGB 13.8 05/09/2021   PLT 238 05/09/2021   NA 136 05/09/2021   K 3.9 05/09/2021   CL 101 05/09/2021   CO2 26 05/09/2021   GLUCOSE 191 (H) 05/09/2021   BUN 10 05/09/2021   CREATININE 1.01 05/09/2021   BILITOT 0.5 04/19/2021   ALKPHOS 108 12/21/2020   AST 16 04/19/2021   ALT 18 04/19/2021   PROT 7.6 04/19/2021   ALBUMIN 4.0 12/21/2020   CALCIUM 8.7 (L) 05/09/2021   GFRAA 112 04/19/2021   QFTBGOLD Negative 04/03/2017   QFTBGOLDPLUS NEGATIVE 11/28/2020    Speciality Comments: Prior therapy: Enbrel/Humira (inadequate response)  and Remicaide (d/c due to cost and time consuming infusion), Cosentyx stopped 04/19/21. Talt started 05/27/21  Procedures:  No procedures performed Allergies: Sulfonamide derivatives and Bee venom   Assessment / Plan:     Visit Diagnoses: No diagnosis found.  Orders: No orders of the defined types were placed in this encounter.  No orders of the defined types  were placed in this encounter.   Face-to-face time spent with patient was *** minutes. Greater than 50% of time was spent in counseling and coordination of care.  Follow-Up Instructions: No follow-ups on file.   Earnestine Mealing, CMA  Note - This record has been created using Editor, commissioning.  Chart creation errors have been sought, but may not always  have been located. Such creation errors do not reflect on  the standard of medical care.

## 2021-07-17 ENCOUNTER — Ambulatory Visit: Payer: BC Managed Care – PPO | Admitting: Orthopedic Surgery

## 2021-07-19 ENCOUNTER — Ambulatory Visit: Payer: BC Managed Care – PPO | Admitting: Rheumatology

## 2021-07-29 ENCOUNTER — Ambulatory Visit: Payer: BC Managed Care – PPO | Admitting: Orthopedic Surgery

## 2021-07-30 ENCOUNTER — Other Ambulatory Visit (HOSPITAL_COMMUNITY): Payer: Self-pay

## 2021-07-31 NOTE — Progress Notes (Signed)
Office Visit Note  Patient: Joseph [name]             Date of Birth: June 09, 1981           MRN: [id number]             PCP: Eunice Blase, MD Referring: Eunice Blase, MD Visit Date: 08/14/2021 Occupation: @GUAROCC @  Subjective:  Medication management   History of Present Illness: Joseph [name] is a 40 y.o. male with a history of psoriatic arthritis and psoriasis.  He was a started on [place] in July 2022.  He states he has been taking [place] on a regular basis.  He had good response to [employer].  He denies any joint pain or joint swelling today.  He denies any recurrence of plantar fasciitis or Achilles tendinitis.  Denies any psoriasis rash.  Activities of Daily Living:  Patient reports morning stiffness for 5 minutes.   Patient Reports nocturnal pain.  Difficulty dressing/grooming: Denies Difficulty climbing stairs: Denies Difficulty getting out of chair: Denies Difficulty using hands for taps, buttons, cutlery, and/or writing: Denies  Review of Systems  Constitutional:  Negative for fatigue.  HENT:  Negative for mouth sores, mouth dryness and nose dryness.   Eyes:  Negative for pain, itching and dryness.  Respiratory:  Negative for shortness of breath and difficulty breathing.   Cardiovascular:  Negative for chest pain and palpitations.  Gastrointestinal:  Negative for blood in stool, constipation and diarrhea.  Endocrine: Negative for increased urination.  Genitourinary:  Negative for difficulty urinating.  Musculoskeletal:  Positive for morning stiffness. Negative for joint pain, joint pain, joint swelling, myalgias, muscle tenderness and myalgias.  Skin:  Negative for color change, rash and redness.  Allergic/Immunologic: Negative for susceptible to infections.  Neurological:  Negative for dizziness, numbness, headaches, memory loss and weakness.  Hematological:  Negative for bruising/bleeding tendency.  Psychiatric/Behavioral:  Negative for confusion.    PMFS History:   Patient Active Problem List   Diagnosis Date Noted   Acute medial meniscus tear of right knee    Elbow dislocation, right, initial encounter 03/06/2020   Closed disp fx of right olecranon with intraartic exten w malunion 02/14/2020   Morbid obesity with BMI of 45.0-49.9, adult ([place]) 11/25/2019   Hair loss 12/31/2017   Varicose veins of both lower extremities 07/26/2017   Iron deficiency anemia 09/21/2014   Psoriatic arthritis ([place]) 09/21/2014   ESSENTIAL HYPERTENSION, MALIGNANT 02/18/2009   Nonspecific (abnormal) findings on radiological and other examination of body structure 02/16/2009   CHEST XRAY, ABNORMAL 02/16/2009    Past Medical History:  Diagnosis Date   Acute sinus infection 11/07/2014   Arthritis    Blood transfusion without reported diagnosis    At [facility]    Difficult intubation 01/13/2020   difficult airway in OR (per Dr Laurey Arrow)   Iron deficiency anemia 09/21/2014   Psoriasis    Psoriatic arthritis (HCC)    Psoriatic arthritis (HCC)    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 Percival Spanish 02/2009 with echo, as needed f/u   Torn meniscus    right knee    Family History  Problem Relation Age of Onset   Pulmonary fibrosis Mother    Hypertension Mother    Pancreatic cancer Maternal Grandmother    Rheum arthritis Paternal Aunt    Cancer Paternal Grandmother    Breast cancer Paternal Grandmother    COPD Paternal  Grandfather    CAD Paternal Grandfather    Colon cancer Neg Hx    Prostate cancer Neg Hx    Diabetes Neg Hx    Thyroid disease Neg Hx    Liver cancer Neg Hx    Stomach cancer Neg Hx    Rectal cancer Neg Hx    Past Surgical History:  Procedure Laterality Date   COLONOSCOPY WITH ESOPHAGOGASTRODUODENOSCOPY (EGD)     EXTERNAL FIXATION REMOVAL Right 03/22/2020   Procedure: REMOVAL EXTERNAL FIXATION ARM;  Surgeon: Shona Needles, MD;  Location: [place];   Service: Orthopedics;  Laterality: Right;   HERNIA REPAIR  1994   umbicial   INNER EAR SURGERY Left    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, 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   KNEE ARTHROSCOPY Right 05/10/2021   Procedure: ARTHROSCOPY KNEE partial medial meniscectomy;  Surgeon: Meredith Pel, MD;  Location: [place];  Service: Orthopedics;  Laterality: Right;   LIGAMENT REPAIR Right 01/13/2020   Procedure: COLLATERAL LIGAMENT REPAIRS;  Surgeon: Milly Jakob, MD;  Location: [place];  Service: Orthopedics;  Laterality: Right;   ORIF ELBOW FRACTURE Right 02/17/2020   Procedure: REVISION OPEN REDUCTION INTERNAL FIXATION (ORIF) ELBOW/OLECRANON FRACTURE;  Surgeon: Shona Needles, MD;  Location: [place];  Service: Orthopedics;  Laterality: Right;   ORIF ULNAR FRACTURE Right 01/13/2020   Procedure: OPEN TREATMENT OF COMPLEX RIGHT PROXIMAL ULNA FRACTURE;  Surgeon: Milly Jakob, MD;  Location: [place];  Service: Orthopedics;  Laterality: Right;  PROCEDURE: OPEN TREATMENT OF COMPLEX RIGHT PROXIMAL ULNA FRACTURE WITH POSSIBLE COLLATERAL LIGAMENT REPAIRS LENGTH OF SURGERY: 2.5 [place] + REGIONAL BLOCK   Social History   Social History Narrative   Right handed   Caffeien use:1 soda per day   Immunization History  Administered Date(s) Administered   MMR 02/12/2001   PFIZER Comirnaty(Gray Top)Covid-19 Tri-Sucrose Vaccine 09/21/2020, 10/12/2020   Pneumococcal Polysaccharide-23 11/10/2012   Td 08/17/2001     Objective: Vital Signs: BP (!) 146/98 (BP Location: Left Wrist, Patient Position: Sitting, Cuff Size: Normal)   Pulse 85   Ht 5' 10"  (1.778 m)   Wt (!) 318 lb 12.8 oz (144.6 kg)   BMI 45.74 kg/m    Physical Exam Vitals and nursing note reviewed.  Constitutional:      Appearance: [name] well-developed.  HENT:     Head: Normocephalic and atraumatic.  Eyes:     Conjunctiva/sclera: Conjunctivae normal.     Pupils: Pupils are equal, round, and reactive  to light.  Cardiovascular:     Rate and Rhythm: Normal rate and regular rhythm.     Heart sounds: Normal heart sounds.  Pulmonary:     Effort: Pulmonary effort is normal.     Breath sounds: Normal breath sounds.  Abdominal:     General: Bowel sounds are normal.     Palpations: Abdomen is soft.  Musculoskeletal:     Cervical back: Normal range of motion and neck supple.  Skin:    General: Skin is warm and dry.     Capillary Refill: Capillary refill takes less than 2 seconds.  Neurological:     Mental Status: [name] alert and oriented to person, place, and time.  Psychiatric:        Behavior: Behavior normal.     Musculoskeletal Exam: C-spine was in good range of motion.  Shoulder joints were in good range of motion.  He had flexion contracture in his right elbow joint due to  previous injury.  Wrist joints with good range of motion.  He had synovial thickening in his right third and left first and fifth digit but no synovitis.  Hip joints and knee joints with good range of motion without any warmth swelling or effusion.  There was no evidence of Achilles tendinitis or plantar fasciitis.  CDAI Exam: CDAI Score: -- Patient Global: --; Provider Global: -- Swollen: --; Tender: -- Joint Exam 08/14/2021   No joint exam has been documented for this visit   There is currently no information documented on the homunculus. Go to the Rheumatology activity and complete the homunculus joint exam.  Investigation: No additional findings.  Imaging: No results found.  Recent Labs: Lab Results  Component Value Date   WBC 10.8 (H) 05/09/2021   HGB 13.8 05/09/2021   PLT 238 05/09/2021   NA 136 05/09/2021   K 3.9 05/09/2021   CL 101 05/09/2021   CO2 26 05/09/2021   GLUCOSE 191 (H) 05/09/2021   BUN 10 05/09/2021   CREATININE 1.01 05/09/2021   BILITOT 0.5 04/19/2021   ALKPHOS 108 12/21/2020   AST 16 04/19/2021   ALT 18 04/19/2021   PROT 7.6 04/19/2021   ALBUMIN 4.0 12/21/2020   CALCIUM  8.7 (L) 05/09/2021   GFRAA 112 04/19/2021   QFTBGOLD Negative 04/03/2017   QFTBGOLDPLUS NEGATIVE 11/28/2020    Speciality Comments: Prior therapy: Enbrel/Humira (inadequate response)  and Remicaide (d/c due to cost and time consuming infusion), Cosentyx stopped 04/19/21. Talt started 05/27/21  FU q 3 months  Procedures:  No procedures performed Allergies: Sulfonamide derivatives and Bee venom   Assessment / Plan:     Visit Diagnoses: Psoriatic arthritis (HCC)-he is doing much better on Taltz.  He had no active synovitis or tenosynovitis on examination today.  He denies any recent episodes of Achilles tendinitis or plantar fasciitis.    Psoriasis-he had no active psoriasis lesions.  High risk medication use - Taltz 80 mg subcu weekly. Previously had inadequate response to Enbrel, Humira, and Remicade - Plan: CBC with Differential/Platelet, COMPLETE METABOLIC PANEL WITH GFR today and then every 3 months to monitor for drug toxicity.  He has been advised to stop Taltz in case he develops an infection.  Updated information regarding ministration was also placed in the AVS.  Primary osteoarthritis of both hands-he denies any discomfort.  Tendinopathy of right shoulder - Resolved.  Primary osteoarthritis of left knee - Moderate OA and mild chondromalacia patella evident on x-rays from 08/31/20.  He had MRI of the left knee on 04/06/2021-ordered by Dr. Junius Roads.  History of torn meniscus of right knee - MRI of the right knee was obtained on 04/06/2021.   Chondromalacia patellae of right knee - X-rays of right knee on 08/31/20 revealed mild chondromalacia patella.  Patient denies any discomfort.  Left hand paresthesia - Referred to Dr. Lucia Gaskins after his last office visit.  NCV with EMG was performed on 12/21/2020 which revealed moderate left median nerve entrapment.  He states that the paresthesias have resolved now.  Closed fracture of proximal end of right ulna with routine healing, unspecified  fracture morphology, subsequent encounter - A fracture of the right proximal ulna on 12/15/2019 and underwent internal fixation by Dr. Grandville Silos on 01/13/2020. He has completed physical therapy.  He has flexion contracture which is unchanged.  History of hypertension-blood pressures are still elevated.  [name] on enalapril.  Varicose veins of both lower extremities, unspecified whether complicated  History of iron deficiency anemia  Orders:  Orders Placed This Encounter  Procedures   CBC with Differential/Platelet   COMPLETE METABOLIC PANEL WITH GFR    Meds ordered this encounter  Medications   clobetasol cream (TEMOVATE) 0.05 %    Sig: Apply topically 2 (two) times daily as needed.    Dispense:  30 g    Refill:  1    Follow-Up Instructions: Return in about 3 months (around 11/14/2021) for Psoriatic arthritis.   Bo Merino, MD  Note - This record has been created using Editor, commissioning.  Chart creation errors have been sought, but may not always  have been located. Such creation errors do not reflect on  the standard of medical care.

## 2021-08-01 ENCOUNTER — Other Ambulatory Visit (HOSPITAL_COMMUNITY): Payer: Self-pay

## 2021-08-02 ENCOUNTER — Other Ambulatory Visit (HOSPITAL_COMMUNITY): Payer: Self-pay

## 2021-08-14 ENCOUNTER — Other Ambulatory Visit: Payer: Self-pay

## 2021-08-14 ENCOUNTER — Encounter: Payer: Self-pay | Admitting: Rheumatology

## 2021-08-14 ENCOUNTER — Ambulatory Visit (INDEPENDENT_AMBULATORY_CARE_PROVIDER_SITE_OTHER): Payer: BC Managed Care – PPO | Admitting: Rheumatology

## 2021-08-14 ENCOUNTER — Ambulatory Visit (INDEPENDENT_AMBULATORY_CARE_PROVIDER_SITE_OTHER): Payer: BC Managed Care – PPO | Admitting: Orthopedic Surgery

## 2021-08-14 ENCOUNTER — Encounter: Payer: Self-pay | Admitting: Orthopedic Surgery

## 2021-08-14 VITALS — BP 146/98 | HR 85 | Ht 70.0 in | Wt 318.8 lb

## 2021-08-14 DIAGNOSIS — L4050 Arthropathic psoriasis, unspecified: Secondary | ICD-10-CM

## 2021-08-14 DIAGNOSIS — L409 Psoriasis, unspecified: Secondary | ICD-10-CM

## 2021-08-14 DIAGNOSIS — Z9889 Other specified postprocedural states: Secondary | ICD-10-CM

## 2021-08-14 DIAGNOSIS — M19041 Primary osteoarthritis, right hand: Secondary | ICD-10-CM | POA: Diagnosis not present

## 2021-08-14 DIAGNOSIS — R600 Localized edema: Secondary | ICD-10-CM

## 2021-08-14 DIAGNOSIS — M19042 Primary osteoarthritis, left hand: Secondary | ICD-10-CM

## 2021-08-14 DIAGNOSIS — I8393 Asymptomatic varicose veins of bilateral lower extremities: Secondary | ICD-10-CM

## 2021-08-14 DIAGNOSIS — R202 Paresthesia of skin: Secondary | ICD-10-CM

## 2021-08-14 DIAGNOSIS — S52001D Unspecified fracture of upper end of right ulna, subsequent encounter for closed fracture with routine healing: Secondary | ICD-10-CM

## 2021-08-14 DIAGNOSIS — M2241 Chondromalacia patellae, right knee: Secondary | ICD-10-CM

## 2021-08-14 DIAGNOSIS — Z79899 Other long term (current) drug therapy: Secondary | ICD-10-CM

## 2021-08-14 DIAGNOSIS — Z87828 Personal history of other (healed) physical injury and trauma: Secondary | ICD-10-CM

## 2021-08-14 DIAGNOSIS — Z862 Personal history of diseases of the blood and blood-forming organs and certain disorders involving the immune mechanism: Secondary | ICD-10-CM

## 2021-08-14 DIAGNOSIS — M67911 Unspecified disorder of synovium and tendon, right shoulder: Secondary | ICD-10-CM

## 2021-08-14 DIAGNOSIS — M1712 Unilateral primary osteoarthritis, left knee: Secondary | ICD-10-CM

## 2021-08-14 DIAGNOSIS — Z8679 Personal history of other diseases of the circulatory system: Secondary | ICD-10-CM

## 2021-08-14 MED ORDER — CLOBETASOL PROPIONATE 0.05 % EX CREA: TOPICAL_CREAM | Freq: Two times a day (BID) | CUTANEOUS | 1 refills | Status: DC | PRN

## 2021-08-14 NOTE — Patient Instructions (Addendum)
Standing Labs We placed an order today for your standing lab work.   Please have your standing labs drawn in January and every 3 months TB gold with next lab  If possible, please have your labs drawn 2 weeks prior to your appointment so that the provider can discuss your results at your appointment.  Please note that you may see your imaging and lab results in MyChart before we have reviewed them. We may be awaiting multiple results to interpret others before contacting you. Please allow our office up to 72 hours to thoroughly review all of the results before contacting the office for clarification of your results.  We have open lab daily: Monday through Thursday from 1:30-4:30 PM and Friday from 1:30-4:00 PM at the office of Dr. Pollyann Savoy, [facility].   Please be advised, all patients with office appointments requiring lab work will take precedent over walk-in lab work.  If possible, please come for your lab work on Monday and Friday afternoons, as you may experience shorter wait times. The office is located at [address], [address], [place], [place] [address] No appointment is necessary.   Labs are drawn by [facility]. Please bring your co-pay at the time of your lab draw.  You may receive a bill from [facility] for your lab work.  If you wish to have your labs drawn at another location, please call the office 24 hours in advance to send orders.  If you have any questions regarding directions or hours of operation,  please call [phone].   As a reminder, please drink plenty of water prior to coming for your lab work. Thanks!   Vaccines You are taking a medication(s) that can suppress your immune system.  The following immunizations are recommended: Flu annually Covid-19  Td/Tdap (tetanus, diphtheria, pertussis) every 10 years Pneumonia (Prevnar 15 then Pneumovax 23 at least 1 year apart.  Alternatively, can take Prevnar 20 without needing additional  dose) Shingrix: 2 doses from 4 weeks to 6 months apart  Please check with your PCP to make sure you are up to date.   If you have signs or symptoms of an infection or start antibiotics: First, call your PCP for workup of your infection. Hold your medication through the infection, until you complete your antibiotics, and until symptoms resolve if you take the following: Injectable medication (Actemra, Benlysta, Cimzia, Cosentyx, Enbrel, Humira, Kevzara, Orencia, Remicade, Simponi, Stelara, Taltz, Tremfya) Methotrexate Leflunomide (Arava) Mycophenolate (Cellcept) Harriette Ohara, Olumiant, or Rinvoq  Heart Disease Prevention   Your inflammatory disease increases your risk of heart disease which includes heart attack, stroke, atrial fibrillation (irregular heartbeats), high blood pressure, heart failure and atherosclerosis (plaque in the arteries).  It is important to reduce your risk by:   Keep blood pressure, cholesterol, and blood sugar at healthy levels   Smoking Cessation   Maintain a healthy weight  BMI 20-25   Eat a healthy diet  Plenty of fresh fruit, vegetables, and whole grains  Limit saturated fats, foods high in sodium, and added sugars  DASH and Mediterranean diet   Increase physical activity  Recommend moderate physically activity for 150 minutes per week/ 30 minutes a day for five days a week These can be broken up into three separate ten-minute sessions during the day.   Reduce Stress  Meditation, slow breathing exercises, yoga, coloring books  Dental visits twice a year

## 2021-08-15 LAB — COMPLETE METABOLIC PANEL WITH GFR
AG Ratio: 1.1 (calc) (ref 1.0–2.5)
ALT: 14 U/L (ref 9–46)
AST: 15 U/L (ref 10–40)
Albumin: 4.2 g/dL (ref 3.6–5.1)
Alkaline phosphatase (APISO): 114 U/L (ref 36–130)
BUN: 11 mg/dL (ref 7–25)
CO2: 23 mmol/L (ref 20–32)
Calcium: 9 mg/dL (ref 8.6–10.3)
Chloride: 102 mmol/L (ref 98–110)
Creat: 1.01 mg/dL (ref 0.60–1.26)
Globulin: 3.7 g/dL (calc) (ref 1.9–3.7)
Glucose, Bld: 92 mg/dL (ref 65–99)
Potassium: 4.1 mmol/L (ref 3.5–5.3)
Sodium: 138 mmol/L (ref 135–146)
Total Bilirubin: 0.6 mg/dL (ref 0.2–1.2)
Total Protein: 7.9 g/dL (ref 6.1–8.1)
eGFR: 98 mL/min/{1.73_m2} (ref >=60)

## 2021-08-15 LAB — CBC WITH DIFFERENTIAL/PLATELET
Absolute Monocytes: 810 cells/uL (ref 200–950)
Basophils Absolute: 78 cells/uL (ref 0–200)
Basophils Relative: 0.7 %
Eosinophils Absolute: 278 cells/uL (ref 15–500)
Eosinophils Relative: 2.5 %
HCT: 48.2 % (ref 38.5–50.0)
Hemoglobin: 15.2 g/dL (ref 13.2–17.1)
Lymphs Abs: 2653 cells/uL (ref 850–3900)
MCH: 25.1 pg — ABNORMAL LOW (ref 27.0–33.0)
MCHC: 31.5 g/dL — ABNORMAL LOW (ref 32.0–36.0)
MCV: 79.5 fL — ABNORMAL LOW (ref 80.0–100.0)
MPV: 11.5 fL (ref 7.5–12.5)
Monocytes Relative: 7.3 %
Neutro Abs: 7282 cells/uL (ref 1500–7800)
Neutrophils Relative %: 65.6 %
Platelets: 246 10*3/uL (ref 140–400)
RBC: 6.06 10*6/uL — ABNORMAL HIGH (ref 4.20–5.80)
RDW: 15.8 % — ABNORMAL HIGH (ref 11.0–15.0)
Total Lymphocyte: 23.9 %
WBC: 11.1 10*3/uL — ABNORMAL HIGH (ref 3.8–10.8)

## 2021-08-15 NOTE — Progress Notes (Signed)
White cell count is mildly elevated and stable.  CMP is normal.

## 2021-08-17 NOTE — Progress Notes (Signed)
Post-Op Visit Note   Patient: Joseph [name]           Date of Birth: 1981/06/14           MRN: [id number] Visit Date: 08/14/2021 PCP: Lavada Mesi, MD   Assessment & Plan:  Chief Complaint:  Chief Complaint  Patient presents with   Right Knee - Post-op Follow-up   Visit Diagnoses:  1. Status post arthroscopy of right knee     Plan: Patient is a 40 year old male who presents s/p right knee arthroscopy with debridement on 05/10/2021.  He reports 0/10 pain.  [name] not taking any medications for pain.  No recurrent swelling that he is noticed.  He has history of psoriatic arthritis and has recently started Taltz in the past several months which she feels has helped a lot with his joint pains.  Overall he states the right knee feels "normal" for him.  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  On exam he has well-healed incisions from prior surgery.  No effusion noted on exam.  No calf tenderness.  Negative Homans' sign.  Able to perform straight leg raise.  Range of motion from 0 to 120 degrees.  Overall he is doing excellent and plan for him to follow-up with the office as needed.  Follow-Up Instructions: No follow-ups on file.   Orders:  No orders of the defined types were placed in this encounter.  No orders of the defined types were placed in this encounter.   Imaging: No results found.  PMFS History: Patient Active Problem List   Diagnosis Date Noted   Acute medial meniscus tear of right knee    Elbow dislocation, right, initial encounter 03/06/2020   Closed disp fx of right olecranon with intraartic exten w malunion 02/14/2020   Morbid obesity with BMI of 45.0-49.9, adult (HCC) 11/25/2019   Hair loss 12/31/2017   Varicose veins of both lower extremities 07/26/2017   Iron deficiency anemia 09/21/2014   Psoriatic arthritis (HCC) 09/21/2014   ESSENTIAL HYPERTENSION, MALIGNANT 02/18/2009   Nonspecific  (abnormal) findings on radiological and other examination of body structure 02/16/2009   CHEST XRAY, ABNORMAL 02/16/2009   Past Medical History:  Diagnosis Date   Acute sinus infection 11/07/2014   Arthritis    Blood transfusion without reported diagnosis    At [facility]    Difficult intubation 01/13/2020   difficult airway in OR (per Dr Rollene Fare)   Iron deficiency anemia 09/21/2014   Psoriasis    Psoriatic arthritis (HCC)    Psoriatic arthritis (HCC)    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 Antoine Poche 02/2009 with echo, as needed f/u   Torn meniscus    right knee    Family History  Problem Relation Age of Onset   Pulmonary fibrosis Mother    Hypertension Mother    Pancreatic cancer Maternal Grandmother    Rheum arthritis Paternal Aunt    Cancer Paternal Grandmother    Breast cancer Paternal Grandmother    COPD Paternal Grandfather    CAD Paternal Grandfather    Colon cancer Neg Hx    Prostate cancer Neg Hx    Diabetes Neg Hx    Thyroid disease Neg Hx    Liver cancer Neg Hx    Stomach cancer Neg Hx    Rectal cancer Neg Hx  Past Surgical History:  Procedure Laterality Date   COLONOSCOPY WITH ESOPHAGOGASTRODUODENOSCOPY (EGD)     EXTERNAL FIXATION REMOVAL Right 03/22/2020   Procedure: REMOVAL EXTERNAL FIXATION ARM;  Surgeon: Roby Lofts, MD;  Location: [facility] OR;  Service: Orthopedics;  Laterality: Right;   HERNIA REPAIR  1994   umbicial   INNER EAR SURGERY Left    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, 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   KNEE ARTHROSCOPY Right 05/10/2021   Procedure: ARTHROSCOPY KNEE partial medial meniscectomy;  Surgeon: Cammy Copa, MD;  Location: [facility];  Service: Orthopedics;  Laterality: Right;   LIGAMENT REPAIR Right 01/13/2020   Procedure: COLLATERAL LIGAMENT REPAIRS;  Surgeon: Mack Hook, MD;  Location: [facility];   Service: Orthopedics;  Laterality: Right;   ORIF ELBOW FRACTURE Right 02/17/2020   Procedure: REVISION OPEN REDUCTION INTERNAL FIXATION (ORIF) ELBOW/OLECRANON FRACTURE;  Surgeon: Roby Lofts, MD;  Location: [facility] OR;  Service: Orthopedics;  Laterality: Right;   ORIF ULNAR FRACTURE Right 01/13/2020   Procedure: OPEN TREATMENT OF COMPLEX RIGHT PROXIMAL ULNA FRACTURE;  Surgeon: Mack Hook, MD;  Location: [facility];  Service: Orthopedics;  Laterality: Right;  PROCEDURE: OPEN TREATMENT OF COMPLEX RIGHT PROXIMAL ULNA FRACTURE WITH POSSIBLE COLLATERAL LIGAMENT REPAIRS LENGTH OF SURGERY: 2.5 HOURSMAC + REGIONAL BLOCK   Social History   Occupational History   Not on file  Tobacco Use   Smoking status: Never   Smokeless tobacco: Never  Vaping Use   Vaping Use: Never used  Substance and Sexual Activity   Alcohol use: No   Drug use: No   Sexual activity: Yes

## 2021-09-09 ENCOUNTER — Other Ambulatory Visit (HOSPITAL_COMMUNITY): Payer: Self-pay

## 2021-09-11 ENCOUNTER — Other Ambulatory Visit (HOSPITAL_COMMUNITY): Payer: Self-pay

## 2021-09-11 ENCOUNTER — Telehealth: Payer: Self-pay

## 2021-09-11 ENCOUNTER — Other Ambulatory Visit: Payer: Self-pay | Admitting: Physician Assistant

## 2021-09-11 ENCOUNTER — Telehealth: Payer: BC Managed Care – PPO | Admitting: Physician Assistant

## 2021-09-11 DIAGNOSIS — Z20822 Contact with and (suspected) exposure to covid-19: Secondary | ICD-10-CM | POA: Diagnosis not present

## 2021-09-11 DIAGNOSIS — L409 Psoriasis, unspecified: Secondary | ICD-10-CM

## 2021-09-11 DIAGNOSIS — L4050 Arthropathic psoriasis, unspecified: Secondary | ICD-10-CM

## 2021-09-11 DIAGNOSIS — U071 COVID-19: Secondary | ICD-10-CM | POA: Diagnosis not present

## 2021-09-11 MED ORDER — NIRMATRELVIR/RITONAVIR (PAXLOVID)TABLET: 3.0000 | ORAL_TABLET | Freq: Two times a day (BID) | ORAL | 0 refills | Status: AC

## 2021-09-11 MED ORDER — ONDANSETRON HCL 4 MG PO TABS: 4.0000 mg | ORAL_TABLET | Freq: Three times a day (TID) | ORAL | 0 refills | Status: DC | PRN

## 2021-09-11 MED ORDER — TALTZ 80 MG/ML ~~LOC~~ SOAJ
80.0000 mg | SUBCUTANEOUS | 0 refills | Status: DC
Start: 2021-09-11 — End: 2021-12-11
  Filled 2021-09-11: qty 1, 28d supply, fill #0
  Filled 2021-10-07: qty 1, 28d supply, fill #1
  Filled 2021-10-31: qty 1, 28d supply, fill #2

## 2021-09-11 MED ORDER — BENZONATATE 100 MG PO CAPS: 100.0000 mg | ORAL_CAPSULE | Freq: Three times a day (TID) | ORAL | 0 refills | Status: DC | PRN

## 2021-09-11 NOTE — Telephone Encounter (Signed)
Called pt regarding COVID questionnaire. LMOM.

## 2021-09-11 NOTE — Patient Instructions (Signed)
Hello Joseph [name],  You are being placed in the home monitoring program for COVID-19 (commonly known as Coronavirus).  This is because you are suspected to have the virus or are known to have the virus.  If you are unsure which group you fall into call your clinic.    As part of this program, you'll answer a daily questionnaire in the MyChart mobile app. You'll receive a notification through the MyChart app when the questionnaire is available. When you log in to MyChart, you'll see the tasks in your To Do activity.       Clinicians will see any answers that are concerning and take appropriate steps.  If at any point you are having a medical emergency, call 911.  If otherwise concerned call your clinic instead of coming into the clinic or hospital.  To keep from spreading the disease you should: Stay home and limit contact with other people as much as possible.  Wash your hands frequently. Cover your coughs and sneezes with a tissue, and throw used tissues in the trash.   Clean and disinfect frequently touched surfaces and objects.    Take care of yourself by: Staying home Resting Drinking fluids Take fever-reducing medications (Tylenol/Acetaminophen and Ibuprofen)  For more information on the disease go to the Centers for Disease Control and Prevention website     You are being prescribed PAXLOVID for COVID-19 infection.   Please call the pharmacy or go through the drive through vs going inside if you are picking up the mediation yourself to prevent further spread. If prescribed to a [facility] affiliated pharmacy, a pharmacist will bring the medication out to your car.   Medications to hold while taking this treatment: none  *If asked to hold, you can resume them 24 hours after your last dose   ADMINISTRATION INSTRUCTIONS: Take with or without food. Swallow the tablets whole. Don't chew, crush, or break the medications because it might not work as well  For each dose of the  medication, you should be taking 3 tablets together (2 pink oval and 1 white oval) TWICE a day for FIVE days   Finish your full five-day course of Paxlovid even if you feel better before you're done. Stopping this medication too early can make it less effective to prevent severe illness related to COVID19.    Paxlovid is prescribed for YOU ONLY. Don't share it with others, even if they have similar symptoms as you. This medication might not be right for everyone.  Make sure to take steps to protect yourself and others while you're taking this medication in order to get well soon and to prevent others from getting sick with COVID-19.  Paxlovid (nirmatrelvir / ritonavir) can cause hormonal birth control medications to not work well. If you or your partner is currently taking hormonal birth control, use condoms or other birth control methods to prevent unintended pregnancies.    COMMON SIDE EFFECTS: Altered or bad taste in your mouth  Diarrhea  High blood pressure (1% of people) Muscle aches (1% of people)     If your COVID-19 symptoms get worse, get medical help right away. Call 911 if you experience symptoms such as worsening cough, trouble breathing, chest pain that doesn't go away, confusion, a hard time staying awake, and pale or blue-colored skin. This medication won't prevent all COVID-19 cases from getting worse.   Can take to lessen severity: Vit C 500mg  twice daily Quercertin 250-500mg  twice daily Zinc 75-100mg  daily Melatonin 3-6 mg  at bedtime Vit D3 1000-2000 IU daily Aspirin 81 mg daily with food Optional: Famotidine 20mg  daily Also can add tylenol/ibuprofen as needed for fevers and body aches May add Mucinex or Mucinex DM as needed for cough/congestion  10 Things You Can Do to Manage Your COVID-19 Symptoms at Home If you have possible or confirmed COVID-19 Stay home except to get medical care. Monitor your symptoms carefully. If your symptoms get worse, call your  healthcare provider immediately. Get rest and stay hydrated. If you have a medical appointment, call the healthcare provider ahead of time and tell them that you have or may have COVID-19. For medical emergencies, call 911 and notify the dispatch personnel that you have or may have COVID-19. Cover your cough and sneezes with a tissue or use the inside of your elbow. Wash your hands often with soap and water for at least 20 seconds or clean your hands with an alcohol-based hand sanitizer that contains at least 60% alcohol. As much as possible, stay in a specific room and away from other people in your home. Also, you should use a separate bathroom, if available. If you need to be around other people in or outside of the home, wear a mask. Avoid sharing personal items with other people in your household, like dishes, towels, and bedding. Clean all surfaces that are touched often, like counters, tabletops, and doorknobs. Use household cleaning sprays or wipes according to the label instructions. [email or web address] 05/25/2020 This information is not intended to replace advice given to you by your health care provider. Make sure you discuss any questions you have with your health care provider. Document Revised: 03/14/2021 Document Reviewed: 03/14/2021 Elsevier Patient Education  [place].

## 2021-09-11 NOTE — Progress Notes (Signed)
Virtual Visit Consent   Joseph [name], you are scheduled for a virtual visit with a [facility] provider today.     Just as with appointments in the office, your consent must be obtained to participate.  Your consent will be active for this visit and any virtual visit you may have with one of our providers in the next 365 days.     If you have a MyChart account, a copy of this consent can be sent to you electronically.  All virtual visits are billed to your insurance company just like a traditional visit in the office.    As this is a virtual visit, video technology does not allow for your provider to perform a traditional examination.  This may limit your provider's ability to fully assess your condition.  If your provider identifies any concerns that need to be evaluated in person or the need to arrange testing (such as labs, EKG, etc.), we will make arrangements to do so.     Although advances in technology are sophisticated, we cannot ensure that it will always work on either your end or our end.  If the connection with a video visit is poor, the visit may have to be switched to a telephone visit.  With either a video or telephone visit, we are not always able to ensure that we have a secure connection.     I need to obtain your verbal consent now.   Are you willing to proceed with your visit today?    Joseph [name] has provided verbal consent on 09/11/2021 for a virtual visit (video or telephone).   Margaretann Loveless, PA-C   Date: 09/11/2021 6:19 PM   Virtual Visit via Video Note   I, Margaretann Loveless, connected with  Joseph [name]  ([id number], Jun 13, 1981) on 09/11/21 at  6:00 PM EDT by a video-enabled telemedicine application and verified that I am speaking with the correct person using two identifiers.  Location: Patient: Virtual Visit Location Patient: Home Provider: Virtual Visit Location Provider: Home Office   I discussed the limitations of evaluation and management  by telemedicine and the availability of in person appointments. The patient expressed understanding and agreed to proceed.    History of Present Illness: Joseph [name] is a 40 y.o. who identifies as a male who was assigned male at birth, and is being seen today for Covid 22.  HPI: URI  This is a new problem. The current episode started in the past 7 days (Sunday/Monday). The problem has been gradually worsening. There has been no fever. Associated symptoms include congestion, coughing, headaches, sinus pain and a sore throat. He has tried acetaminophen, NSAIDs, sleep and increased fluids for the symptoms. The treatment provided no relief.     Problems:  Patient Active Problem List   Diagnosis Date Noted   Acute medial meniscus tear of right knee    Elbow dislocation, right, initial encounter 03/06/2020   Closed disp fx of right olecranon with intraartic exten w malunion 02/14/2020   Morbid obesity with BMI of 45.0-49.9, adult (HCC) 11/25/2019   Hair loss 12/31/2017   Varicose veins of both lower extremities 07/26/2017   Iron deficiency anemia 09/21/2014   Psoriatic arthritis (HCC) 09/21/2014   ESSENTIAL HYPERTENSION, MALIGNANT 02/18/2009   Nonspecific (abnormal) findings on radiological and other examination of body structure 02/16/2009   CHEST XRAY, ABNORMAL 02/16/2009    Allergies:  Allergies  Allergen Reactions   Sulfonamide Derivatives Other (See Comments)  UNSPECIFIED   Bee Venom Palpitations    unknown   Medications:  Current Outpatient Medications:    benzonatate (TESSALON) 100 MG capsule, Take 1 capsule (100 mg total) by mouth 3 (three) times daily as needed., Disp: 30 capsule, Rfl: 0   nirmatrelvir/ritonavir EUA (PAXLOVID) 20 x 150 MG & 10 x 100MG  TABS, Take 3 tablets by mouth 2 (two) times daily for 5 days. (Take nirmatrelvir 150 mg two tablets twice daily for 5 days and ritonavir 100 mg one tablet twice daily for 5 days) Patient GFR is 98, Disp: 30 tablet, Rfl: 0    ondansetron (ZOFRAN) 4 MG tablet, Take 1 tablet (4 mg total) by mouth every 8 (eight) hours as needed for nausea or vomiting., Disp: 20 tablet, Rfl: 0   clobetasol cream (TEMOVATE) 0.05 %, Apply topically 2 (two) times daily as needed., Disp: 30 g, Rfl: 1   enalapril (VASOTEC) 5 MG tablet, TAKE 1 TABLET BY MOUTH EVERY DAY, Disp: 90 tablet, Rfl: 1   Ixekizumab (TALTZ) 80 MG/ML SOAJ, Inject 80 mg into the skin every 28 (twenty-eight) days., Disp: 3 mL, Rfl: 0  Observations/Objective: Patient is well-developed, well-nourished in no acute distress.  Resting comfortably at home.  Head is normocephalic, atraumatic.  No labored breathing.  Speech is clear and coherent with logical content.  Patient is alert and oriented at baseline.    Assessment and Plan: 1. COVID-19 - nirmatrelvir/ritonavir EUA (PAXLOVID) 20 x 150 MG & 10 x 100MG  TABS; Take 3 tablets by mouth 2 (two) times daily for 5 days. (Take nirmatrelvir 150 mg two tablets twice daily for 5 days and ritonavir 100 mg one tablet twice daily for 5 days) Patient GFR is 98  Dispense: 30 tablet; Refill: 0 - benzonatate (TESSALON) 100 MG capsule; Take 1 capsule (100 mg total) by mouth 3 (three) times daily as needed.  Dispense: 30 capsule; Refill: 0 - ondansetron (ZOFRAN) 4 MG tablet; Take 1 tablet (4 mg total) by mouth every 8 (eight) hours as needed for nausea or vomiting.  Dispense: 20 tablet; Refill: 0 - MyChart COVID-19 home monitoring program; Future  - Continue OTC symptomatic management of choice - Will send OTC vitamins and supplement information through AVS - Paxlovid prescribed - Tessalon perles for cough - Zofran for nausea if develops from paxlovid - Patient enrolled in MyChart symptom monitoring - Push fluids - Rest as needed - Discussed return precautions and when to seek in-person evaluation, sent via AVS as well   Follow Up Instructions: I discussed the assessment and treatment plan with the patient. The patient was  provided an opportunity to ask questions and all were answered. The patient agreed with the plan and demonstrated an understanding of the instructions.  A copy of instructions were sent to the patient via MyChart unless otherwise noted below.    The patient was advised to call back or seek an in-person evaluation if the symptoms worsen or if the condition fails to improve as anticipated.  Time:  I spent 10 minutes with the patient via telehealth technology discussing the above problems/concerns.    , PA-C

## 2021-09-11 NOTE — Telephone Encounter (Signed)
Next Visit: 11/14/2021  Last Visit: 08/14/2021  Last Fill: 05/27/2021 (loading dose)   DX: Psoriatic arthritis  Current Dose per office note on 08/14/2021: Taltz 80 mg subcu weekly.   Labs: 08/14/2021 White cell count is mildly elevated and stable.  CMP is normal.  TB Gold: 11/28/2020 negative    Okay to refill taltz?   (Please review dose mentioned in last office visit) thanks!

## 2021-09-12 ENCOUNTER — Telehealth: Payer: Self-pay

## 2021-09-12 ENCOUNTER — Other Ambulatory Visit (HOSPITAL_COMMUNITY): Payer: Self-pay

## 2021-09-12 NOTE — Telephone Encounter (Signed)
Left message to call back about worsening of COVID 19 symptoms on questionnaire.

## 2021-09-12 NOTE — Telephone Encounter (Signed)
Pt. Reports he is "feeling better today. I'm still coughing, but better." Instructed to reach out to PCP as needed, verbalizes understanding.

## 2021-09-16 NOTE — Telephone Encounter (Signed)
Called pt 09/11/2021 regarding COVID questionnaire. LMOM.

## 2021-09-26 NOTE — Telephone Encounter (Signed)
Recommend waiting to resume taltz for 1 week after complete resolution of symptoms.  He can adjust his monthly injections accordingly for future injections.

## 2021-10-07 ENCOUNTER — Other Ambulatory Visit (HOSPITAL_COMMUNITY): Payer: Self-pay

## 2021-10-09 ENCOUNTER — Other Ambulatory Visit (HOSPITAL_COMMUNITY): Payer: Self-pay

## 2021-10-31 ENCOUNTER — Other Ambulatory Visit (HOSPITAL_COMMUNITY): Payer: Self-pay

## 2021-10-31 NOTE — Progress Notes (Signed)
Office Visit Note  Patient: Joseph [name]             Date of Birth: [birth year]           MRN: [id number]             PCP: Merryl Hacker, No Referring: Eunice Blase, MD Visit Date: 11/14/2021 Occupation: _0 @  Subjective:  Right calf swelling   History of Present Illness: Joseph [name] is a 40 y.o. male with history of psoriatic arthritis. He was started on taltz on 05/27/21. He remains on taltz 80 mg sq injections every month.  [name] tolerating Taltz without any side effects.  He denies any signs or symptoms of a psoriatic arthritis flare.  He denies any active psoriasis.  He has not had any SI joint discomfort.  He denies any Achilles tendinitis or plantar fasciitis.  He reports that the pain and stiffness in his hands has resolved since initiating Toltz.  He states that he recently started a new job with [employer] and has been working up to 6 days a week for 6 to 8 hours at a time.  He states that by the end of his shift he has not noticed increased swelling and soreness in his right calf.  He denies any increased pain in the right knee or right ankle joint.  He denies any shortness of breath.  He denies any recent infections.   Activities of Daily Living:  Patient reports morning stiffness for 0  none .   Patient Denies nocturnal pain.  Difficulty dressing/grooming: Denies Difficulty climbing stairs: Denies Difficulty getting out of chair: Denies Difficulty using hands for taps, buttons, cutlery, and/or writing: Denies  Review of Systems  Constitutional:  Negative for fatigue.  HENT:  Negative for mouth dryness.   Eyes:  Negative for dryness.  Respiratory:  Negative for shortness of breath.   Cardiovascular:  Positive for swelling in legs/feet.  Gastrointestinal:  Negative for constipation.  Endocrine: Negative for excessive thirst.  Genitourinary:  Negative for difficulty urinating.  Musculoskeletal:  Negative for joint pain and joint pain.  Skin:  Negative for rash.   Allergic/Immunologic: Negative for susceptible to infections.  Neurological:  Negative for numbness.  Hematological:  Negative for bruising/bleeding tendency.  Psychiatric/Behavioral:  Negative for sleep disturbance.    PMFS History:  Patient Active Problem List   Diagnosis Date Noted   Acute medial meniscus tear of right knee    Elbow dislocation, right, initial encounter 03/06/2020   Closed disp fx of right olecranon with intraartic exten w malunion 02/14/2020   Morbid obesity with BMI of 45.0-49.9, adult ([place]) 11/25/2019   Hair loss 12/31/2017   Varicose veins of both lower extremities 07/26/2017   Iron deficiency anemia 09/21/2014   Psoriatic arthritis ([place]) 09/21/2014   ESSENTIAL HYPERTENSION, MALIGNANT 02/18/2009   Nonspecific (abnormal) findings on radiological and other examination of body structure 02/16/2009   CHEST XRAY, ABNORMAL 02/16/2009    Past Medical History:  Diagnosis Date   Acute sinus infection 11/07/2014   Arthritis    Blood transfusion without reported diagnosis    At [facility]    Difficult intubation 01/13/2020   difficult airway in OR (per Dr Laurey Arrow)   Iron deficiency anemia 09/21/2014   Psoriasis    Psoriatic arthritis ([facility])    Psoriatic arthritis ([place])    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 Percival Spanish 02/2009 with  echo, as needed f/u   Torn meniscus    right knee    Family History  Problem Relation Age of Onset   Pulmonary fibrosis Mother    Hypertension Mother    Pancreatic cancer Maternal Grandmother    Rheum arthritis Paternal Aunt    Cancer Paternal Grandmother    Breast cancer Paternal Grandmother    COPD Paternal Grandfather    CAD Paternal Grandfather    Colon cancer Neg Hx    Prostate cancer Neg Hx    Diabetes Neg Hx    Thyroid disease Neg Hx    Liver cancer Neg Hx    Stomach cancer Neg Hx    Rectal cancer Neg Hx     Past Surgical History:  Procedure Laterality Date   COLONOSCOPY WITH ESOPHAGOGASTRODUODENOSCOPY (EGD)     EXTERNAL FIXATION REMOVAL Right 03/22/2020   Procedure: REMOVAL EXTERNAL FIXATION ARM;  Surgeon: Shona Needles, MD;  Location: [facility];  Service: Orthopedics;  Laterality: Right;   HERNIA REPAIR  1994   umbicial   INNER EAR SURGERY Left    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, 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   KNEE ARTHROSCOPY Right 05/10/2021   Procedure: ARTHROSCOPY KNEE partial medial meniscectomy;  Surgeon: Meredith Pel, MD;  Location: [place];  Service: Orthopedics;  Laterality: Right;   LIGAMENT REPAIR Right 01/13/2020   Procedure: COLLATERAL LIGAMENT REPAIRS;  Surgeon: Milly Jakob, MD;  Location: [place];  Service: Orthopedics;  Laterality: Right;   ORIF ELBOW FRACTURE Right 02/17/2020   Procedure: REVISION OPEN REDUCTION INTERNAL FIXATION (ORIF) ELBOW/OLECRANON FRACTURE;  Surgeon: Shona Needles, MD;  Location: [facility];  Service: Orthopedics;  Laterality: Right;   ORIF ULNAR FRACTURE Right 01/13/2020   Procedure: OPEN TREATMENT OF COMPLEX RIGHT PROXIMAL ULNA FRACTURE;  Surgeon: Milly Jakob, MD;  Location: [facility];  Service: Orthopedics;  Laterality: Right;  PROCEDURE: OPEN TREATMENT OF COMPLEX RIGHT PROXIMAL ULNA FRACTURE WITH POSSIBLE COLLATERAL LIGAMENT REPAIRS LENGTH OF SURGERY: 2.5 [place] + REGIONAL BLOCK   Social History   Social History Narrative   Right handed   Caffeien use:1 soda per day   Immunization History  Administered Date(s) Administered   MMR 02/12/2001   PFIZER Comirnaty(Gray Top)Covid-19 Tri-Sucrose Vaccine 09/21/2020, 10/12/2020   Pneumococcal Polysaccharide-23 11/10/2012   Td 08/17/2001     Objective: Vital Signs: BP (!) 141/100 (BP Location: Left Arm, Patient Position: Sitting, Cuff Size: Large)    Pulse 73    Resp 12    Ht _0  (1.778 m)    Wt (!) 322 lb (146.1 kg)    BMI 46.20 kg/m    Physical  Exam Vitals and nursing note reviewed.  Constitutional:      Appearance: [name] well-developed.  HENT:     Head: Normocephalic and atraumatic.  Eyes:     Conjunctiva/sclera: Conjunctivae normal.     Pupils: Pupils are equal, round, and reactive to light.  Pulmonary:     Effort: Pulmonary effort is normal.  Abdominal:     Palpations: Abdomen is soft.  Musculoskeletal:     Cervical back: Normal range of motion and neck supple.  Skin:    General: Skin is warm and dry.     Capillary Refill: Capillary refill takes less than 2 seconds.  Neurological:     Mental Status: [name] alert and oriented to person, place, and time.  Psychiatric:        Behavior: Behavior normal.  Musculoskeletal Exam: C-spine, thoracic spine, lumbar spine have good range of motion.  Shoulder joints have good range of motion with no discomfort.  Right elbow joint contracture noted.  Wrist joints, MCPs, PIPs, DIPs have good range of motion with no synovitis.  Some PIP and DIP thickening in both hands noted.  Complete fist formation bilaterally.  Hip joints have good range of motion with no groin pain.  Knee joints have good range of motion with no warmth or effusion.  Ankle joints have good range of motion with no tenderness or joint swelling.  Pitting edema noted bilaterally.  Some calf tenderness on the right side noted.  CDAI Exam: CDAI Score: -- Patient Global: --; Provider Global: -- Swollen: --; Tender: -- Joint Exam 11/14/2021   No joint exam has been documented for this visit   There is currently no information documented on the homunculus. Go to the Rheumatology activity and complete the homunculus joint exam.  Investigation: No additional findings.  Imaging: No results found.  Recent Labs: Lab Results  Component Value Date   WBC 11.1 (H) 08/14/2021   HGB 15.2 08/14/2021   PLT 246 08/14/2021   NA 138 08/14/2021   K 4.1 08/14/2021   CL 102 08/14/2021   CO2 23 08/14/2021   GLUCOSE 92  08/14/2021   BUN 11 08/14/2021   CREATININE 1.01 08/14/2021   BILITOT 0.6 08/14/2021   ALKPHOS 108 12/21/2020   AST 15 08/14/2021   ALT 14 08/14/2021   PROT 7.9 08/14/2021   ALBUMIN 4.0 12/21/2020   CALCIUM 9.0 08/14/2021   GFRAA 112 04/19/2021   QFTBGOLD Negative 04/03/2017   QFTBGOLDPLUS NEGATIVE 11/28/2020    Speciality Comments: Prior therapy: Enbrel/Humira (inadequate response)  and Remicaide (d/c due to cost and time consuming infusion), Cosentyx stopped 04/19/21. Talt started 05/27/21  FU q 3 months  Procedures:  No procedures performed Allergies: Sulfonamide derivatives and Bee venom    Assessment / Plan:     Visit Diagnoses: Psoriatic arthritis ([place]): He has no synovitis or dactylitis on examination today.  He has not had any signs or symptoms of a psoriatic arthritis flare.  He has clinically been doing well on Taltz 80 mg subcutaneous injections once monthly.  He has not missed any doses of Taltz recently and continues to tolerate these injections without any side effects or injection site reactions.  [name] not experiencing any SI joint tenderness or stiffness.  He has no evidence of Achilles tendinitis or plantar fasciitis.  He has not been experiencing any morning stiffness, difficulty with ADLs, or nocturnal pain.  He will remain on Taltz as prescribed.  He was advised to notify us if he develops signs or symptoms of a flare.  He will follow-up in the office in 5 months.  Psoriasis: He has no active psoriasis at this time.  High risk medication use - Taltz 80 mg sq injections every month. Previously had inadequate response to Enbrel, Humira, cosentyx and Remicade.  CBC and CMP updated today on 08/14/21.  [name] due to update lab work.  Orders for CBC and CMP were released. He will continue to require lab work every 3 months to monitor for drug toxicity.  Standing orders for CBC and CMP will be placed today.  TB gold negative on 11/28/20.  Order for TB gold released.- Plan:  QuantiFERON-TB Gold Plus, CBC with Differential/Platelet, COMPLETE METABOLIC PANEL WITH GFR, CBC with Differential/Platelet, COMPLETE METABOLIC PANEL WITH GFR He has not had any recent infections.  Discussed the importance of holding Taltz if he develops signs or symptoms of infection and to resume once infection has completely cleared.  Screening for tuberculosis -Order for TB gold released today.  Plan: QuantiFERON-TB Gold Plus  Primary osteoarthritis of both hands: He has some PIP and DIP thickening consistent with osteoarthritis of both hands.  Complete fist formation.  Discussed importance of joint protection and muscle strengthening.  Primary osteoarthritis of left knee - Moderate OA and mild chondromalacia patella evident on x-rays from 08/31/20.  He had MRI of the left knee on 04/06/2021-ordered by Dr. Junius Roads.  He has good range of motion of the left knee joint with no discomfort on examination today.  Tendinopathy of right shoulder - Resolved.  History of torn meniscus of right knee - MRI of the right knee was obtained on 04/06/2021.    Chondromalacia patellae of right knee - X-rays of right knee on 08/31/20 revealed mild chondromalacia patella.  He has good range of motion of the right knee joint with no discomfort at this time.  Right calf pain: He presents today with right calf tenderness which started about 2 weeks ago.  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  After each shift he has been noticing increased pain and swelling in his right calf.  He has been taking Tylenol as needed for pain relief and tries to elevate his leg which alleviates some of his discomfort.  [name] not experiencing any shortness of breath or pleuritic chest pain at this time. On examination he has pitting edema in both lower extremities, right greater than left.  Discussed that his symptoms could be related to using the clutch while driving but we will need  to rule out a blood clot as a possible etiology of his symptoms.  A venous duplex ultrasound will be ordered for further evaluation.  [name] scheduled for tomorrow at 1 PM.   Calf swelling: Right-He has been experiencing intermittent calf swelling and tenderness on a daily basis for the past 2 weeks.  He has tenderness over the right calf on examination today.  Pitting edema was noted.  No Baker's cyst was palpable.  He had good range of motion of the right knee with no discomfort.  No tenderness or effusion of the right ankle joint noted. A venous duplex ultrasound will be obtained for further evaluation.  Other medical conditions are listed as follows:  Closed fracture of proximal end of right ulna with routine healing, unspecified fracture morphology, subsequent encounter - A fracture of the right proximal ulna on 12/15/2019 and underwent internal fixation by Dr. Grandville Silos on 01/13/2020.  History of hypertension  Varicose veins of both lower extremities, unspecified whether complicated  History of iron deficiency anemia  Orders: Orders Placed This Encounter  Procedures   QuantiFERON-TB Gold Plus   CBC with Differential/Platelet   COMPLETE METABOLIC PANEL WITH GFR   CBC with Differential/Platelet   COMPLETE METABOLIC PANEL WITH GFR   No orders of the defined types were placed in this encounter.     Follow-Up Instructions: Return in about 5 months (around 04/14/2022) for Psoriatic arthritis.   Ofilia Neas, PA-C  Note - This record has been created using Dragon software.  Chart creation errors have been sought, but may not always  have been located. Such creation errors do not reflect on  the standard of medical care.

## 2021-11-01 ENCOUNTER — Ambulatory Visit: Payer: BC Managed Care – PPO | Admitting: Family Medicine

## 2021-11-06 ENCOUNTER — Other Ambulatory Visit (HOSPITAL_COMMUNITY): Payer: Self-pay

## 2021-11-14 ENCOUNTER — Ambulatory Visit (INDEPENDENT_AMBULATORY_CARE_PROVIDER_SITE_OTHER): Payer: BC Managed Care – PPO | Admitting: Physician Assistant

## 2021-11-14 ENCOUNTER — Encounter: Payer: Self-pay | Admitting: Physician Assistant

## 2021-11-14 ENCOUNTER — Other Ambulatory Visit: Payer: Self-pay

## 2021-11-14 VITALS — BP 141/100 | HR 73 | Resp 12 | Ht 70.0 in | Wt 322.0 lb

## 2021-11-14 DIAGNOSIS — M1712 Unilateral primary osteoarthritis, left knee: Secondary | ICD-10-CM

## 2021-11-14 DIAGNOSIS — S52001D Unspecified fracture of upper end of right ulna, subsequent encounter for closed fracture with routine healing: Secondary | ICD-10-CM

## 2021-11-14 DIAGNOSIS — M7989 Other specified soft tissue disorders: Secondary | ICD-10-CM

## 2021-11-14 DIAGNOSIS — Z862 Personal history of diseases of the blood and blood-forming organs and certain disorders involving the immune mechanism: Secondary | ICD-10-CM

## 2021-11-14 DIAGNOSIS — Z8679 Personal history of other diseases of the circulatory system: Secondary | ICD-10-CM

## 2021-11-14 DIAGNOSIS — Z79899 Other long term (current) drug therapy: Secondary | ICD-10-CM

## 2021-11-14 DIAGNOSIS — I8393 Asymptomatic varicose veins of bilateral lower extremities: Secondary | ICD-10-CM

## 2021-11-14 DIAGNOSIS — M19042 Primary osteoarthritis, left hand: Secondary | ICD-10-CM

## 2021-11-14 DIAGNOSIS — L409 Psoriasis, unspecified: Secondary | ICD-10-CM

## 2021-11-14 DIAGNOSIS — Z111 Encounter for screening for respiratory tuberculosis: Secondary | ICD-10-CM

## 2021-11-14 DIAGNOSIS — M79661 Pain in right lower leg: Secondary | ICD-10-CM

## 2021-11-14 DIAGNOSIS — M19041 Primary osteoarthritis, right hand: Secondary | ICD-10-CM

## 2021-11-14 DIAGNOSIS — M2241 Chondromalacia patellae, right knee: Secondary | ICD-10-CM

## 2021-11-14 DIAGNOSIS — Z87828 Personal history of other (healed) physical injury and trauma: Secondary | ICD-10-CM

## 2021-11-14 DIAGNOSIS — L4050 Arthropathic psoriasis, unspecified: Secondary | ICD-10-CM | POA: Diagnosis not present

## 2021-11-14 DIAGNOSIS — M67911 Unspecified disorder of synovium and tendon, right shoulder: Secondary | ICD-10-CM

## 2021-11-14 NOTE — Patient Instructions (Addendum)
[facility]  [address], [place], [place] [address]  [phone]  Appointment on 11/15/2021 at 1:00pm   Standing Labs We placed an order today for your standing lab work.   Please have your standing labs drawn in April and every 3 months   If possible, please have your labs drawn 2 weeks prior to your appointment so that the provider can discuss your results at your appointment.  Please note that you may see your imaging and lab results in MyChart before we have reviewed them. We may be awaiting multiple results to interpret others before contacting you. Please allow our office up to 72 hours to thoroughly review all of the results before contacting the office for clarification of your results.  We have open lab daily: Monday through Thursday from 1:30-4:30 PM and Friday from 1:30-4:00 PM at the office of Dr. Pollyann Savoy, [facility].   Please be advised, all patients with office appointments requiring lab work will take precedent over walk-in lab work.  If possible, please come for your lab work on Monday and Friday afternoons, as you may experience shorter wait times. The office is located at [address], [place], [place] [address] No appointment is necessary.   Labs are drawn by [facility]. Please bring your co-pay at the time of your lab draw.  You may receive a bill from [facility] for your lab work.  If you wish to have your labs drawn at another location, please call the office 24 hours in advance to send orders.  If you have any questions regarding directions or hours of operation,  please call [phone].   As a reminder, please drink plenty of water prior to coming for your lab work. Thanks!  If you have signs or symptoms of an infection or start antibiotics: First, call your PCP for workup of your infection. Hold your medication through the infection, until you complete your antibiotics, and until symptoms resolve if  you take the following: Injectable medication (Actemra, Benlysta, Cimzia, Cosentyx, Enbrel, Humira, Kevzara, Orencia, Remicade, Simponi, Stelara, Taltz, Tremfya) Methotrexate Leflunomide (Arava) Mycophenolate (Cellcept) Harriette Ohara, Olumiant, or Rinvoq

## 2021-11-15 ENCOUNTER — Ambulatory Visit (HOSPITAL_COMMUNITY)
Admission: RE | Admit: 2021-11-15 | Discharge: 2021-11-15 | Disposition: A | Discharge status: Home / Self Care | Payer: BC Managed Care – PPO | Source: Ambulatory Visit | Attending: Rheumatology | Admitting: Rheumatology

## 2021-11-15 DIAGNOSIS — M79661 Pain in right lower leg: Secondary | ICD-10-CM | POA: Diagnosis not present

## 2021-11-15 DIAGNOSIS — M7989 Other specified soft tissue disorders: Secondary | ICD-10-CM | POA: Diagnosis not present

## 2021-11-15 NOTE — Progress Notes (Signed)
CBC stable. CMP WNL.

## 2021-11-17 LAB — CBC WITH DIFFERENTIAL/PLATELET
Absolute Monocytes: 627 cells/uL (ref 200–950)
Basophils Absolute: 69 cells/uL (ref 0–200)
Basophils Relative: 0.7 %
Eosinophils Absolute: 353 cells/uL (ref 15–500)
Eosinophils Relative: 3.6 %
HCT: 44.7 % (ref 38.5–50.0)
Hemoglobin: 14.5 g/dL (ref 13.2–17.1)
Lymphs Abs: 2479 cells/uL (ref 850–3900)
MCH: 26.6 pg — ABNORMAL LOW (ref 27.0–33.0)
MCHC: 32.4 g/dL (ref 32.0–36.0)
MCV: 81.9 fL (ref 80.0–100.0)
MPV: 11.3 fL (ref 7.5–12.5)
Monocytes Relative: 6.4 %
Neutro Abs: 6272 cells/uL (ref 1500–7800)
Neutrophils Relative %: 64 %
Platelets: 226 10*3/uL (ref 140–400)
RBC: 5.46 10*6/uL (ref 4.20–5.80)
RDW: 15.2 % — ABNORMAL HIGH (ref 11.0–15.0)
Total Lymphocyte: 25.3 %
WBC: 9.8 10*3/uL (ref 3.8–10.8)

## 2021-11-17 LAB — QUANTIFERON-TB GOLD PLUS
Mitogen-NIL: >10 IU/mL
NIL: 0.04 IU/mL
QuantiFERON-TB Gold Plus: NEGATIVE
TB1-NIL: 0 IU/mL
TB2-NIL: 0 IU/mL

## 2021-11-17 LAB — COMPLETE METABOLIC PANEL WITH GFR
AG Ratio: 1.1 (calc) (ref 1.0–2.5)
ALT: 17 U/L (ref 9–46)
AST: 16 U/L (ref 10–40)
Albumin: 4 g/dL (ref 3.6–5.1)
Alkaline phosphatase (APISO): 102 U/L (ref 36–130)
BUN: 10 mg/dL (ref 7–25)
CO2: 27 mmol/L (ref 20–32)
Calcium: 8.6 mg/dL (ref 8.6–10.3)
Chloride: 102 mmol/L (ref 98–110)
Creat: 0.98 mg/dL (ref 0.60–1.26)
Globulin: 3.7 g/dL (calc) (ref 1.9–3.7)
Glucose, Bld: 97 mg/dL (ref 65–99)
Potassium: 4.1 mmol/L (ref 3.5–5.3)
Sodium: 138 mmol/L (ref 135–146)
Total Bilirubin: 0.5 mg/dL (ref 0.2–1.2)
Total Protein: 7.7 g/dL (ref 6.1–8.1)
eGFR: 101 mL/min/{1.73_m2} (ref >=60)

## 2021-11-18 ENCOUNTER — Other Ambulatory Visit: Payer: Self-pay | Admitting: *Deleted

## 2021-11-18 DIAGNOSIS — I8393 Asymptomatic varicose veins of bilateral lower extremities: Secondary | ICD-10-CM

## 2021-11-18 NOTE — Progress Notes (Signed)
Ok to place a referral to vascular if the patient would like to see a specialist.  He may also benefit from wearing compression stockings while driving for long periods of time.

## 2021-11-18 NOTE — Progress Notes (Signed)
TB gold negative

## 2021-11-18 NOTE — Progress Notes (Signed)
No evidence of superficial or deep thrombosis.  Large cluster of varicose veins at symptomatic area.  Please notify the patient.

## 2021-11-20 ENCOUNTER — Encounter: Payer: Self-pay | Admitting: Medical

## 2021-11-20 ENCOUNTER — Ambulatory Visit (INDEPENDENT_AMBULATORY_CARE_PROVIDER_SITE_OTHER): Payer: BC Managed Care – PPO | Admitting: Medical

## 2021-11-20 VITALS — BP 137/89 | HR 79 | Temp 98.2°F | Resp 18 | Ht 70.0 in | Wt 324.0 lb

## 2021-11-20 DIAGNOSIS — Z23 Encounter for immunization: Secondary | ICD-10-CM | POA: Diagnosis not present

## 2021-11-20 DIAGNOSIS — I10 Essential (primary) hypertension: Secondary | ICD-10-CM

## 2021-11-20 DIAGNOSIS — L4050 Arthropathic psoriasis, unspecified: Secondary | ICD-10-CM | POA: Diagnosis not present

## 2021-11-20 DIAGNOSIS — Z0000 Encounter for general adult medical examination without abnormal findings: Secondary | ICD-10-CM

## 2021-11-20 LAB — LIPID PANEL
Cholesterol: 162 mg/dL (ref 0–200)
HDL: 30.2 mg/dL — ABNORMAL LOW (ref >39.00)
NonHDL: 131.66
Total CHOL/HDL Ratio: 5
Triglycerides: 231 mg/dL — ABNORMAL HIGH (ref 0.0–149.0)
VLDL: 46.2 mg/dL — ABNORMAL HIGH (ref 0.0–40.0)

## 2021-11-20 LAB — LDL CHOLESTEROL, DIRECT: Direct LDL: 98 mg/dL

## 2021-11-20 MED ORDER — ENALAPRIL MALEATE 5 MG PO TABS: 5.0000 mg | ORAL_TABLET | Freq: Every day | ORAL | 3 refills | Status: DC

## 2021-11-20 NOTE — Progress Notes (Signed)
Subjective:    Patient ID: Joseph [name], male    DOB: 01/28/1981, 41 y.o.   MRN: [id number]  HPI  Pt in for first time.  Not overlly complicated history and decided to go ahead and do wellness exam. Pt is fasting.   Pt needs flu and pcv. Note on immunospressive med or psoriatic arthritis.  Pt is local truck Firefighter. Pt states just recently started exercising/joined [employer]. Pt admits not eating healthy. Pt drinks Dr. Reino Kent 6 pack or more a day. Non smoker. No alcohol use.  Htn- Pt bp is decently controlled today. Has been out of meds for 10 days. Only on enalpril 5 mg daily.   Pt has psoriatic arthritis- he is on Taltz for 5-6 months. He states helps a lot.  History of anemia secondary to excess nsaids  use in 2015.     Review of Systems  Constitutional:  Negative for chills, fatigue and fever.  HENT:  Negative for congestion and drooling.   Respiratory:  Negative for cough, chest tightness and wheezing.   Cardiovascular:  Negative for chest pain and palpitations.  Gastrointestinal:  Negative for abdominal pain.  Endocrine: Negative for polydipsia and polyuria.  Genitourinary:  Negative for dysuria, flank pain and frequency.  Musculoskeletal:  Negative for back pain.  Neurological:  Negative for dizziness.  Hematological:  Negative for adenopathy. Does not bruise/bleed easily.  Psychiatric/Behavioral:  Negative for behavioral problems, decreased concentration and sleep disturbance. The patient is not nervous/anxious.     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 Antoine Poche 02/2009 with echo, as needed f/u   Torn meniscus    right knee     Social History   Socioeconomic History   Marital status: Married    Spouse name: Shanda Bumps   Number of children: 0   Years of education: 12   Highest education level: Not on file  Occupational History   Not on file  Tobacco Use   Smoking status: Never   Smokeless tobacco: Never  Vaping Use   Vaping Use: Never used  Substance and Sexual Activity   Alcohol use: No   Drug use: No   Sexual activity: Yes  Other Topics Concern   Not on file  Social History Narrative   Right handed   Caffeien use:1 soda per day   Social Determinants of Health   Financial Resource Strain: Not on file  Food Insecurity: Not on file  Transportation Needs: Not on file  Physical Activity: Not on file  Stress: Not on file  Social Connections: Not on file  Intimate Partner Violence: Not on file    Past Surgical History:  Procedure Laterality Date   COLONOSCOPY WITH ESOPHAGOGASTRODUODENOSCOPY (EGD)     EXTERNAL FIXATION REMOVAL Right 03/22/2020   Procedure: REMOVAL EXTERNAL FIXATION ARM;  Surgeon: Roby Lofts, MD;  [facility];  Service: Orthopedics;  Laterality: Right;   HERNIA REPAIR  1994   umbicial   INNER EAR SURGERY Left  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, 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   KNEE ARTHROSCOPY Right 05/10/2021   Procedure: ARTHROSCOPY KNEE partial medial meniscectomy;  Surgeon: Cammy Copa, MD;  Location: [facility];  Service: Orthopedics;  Laterality: Right;   LIGAMENT REPAIR Right 01/13/2020   Procedure: COLLATERAL LIGAMENT REPAIRS;  Surgeon: Mack Hook, MD;  Location: [facility];  Service: Orthopedics;  Laterality: Right;   ORIF ELBOW FRACTURE Right 02/17/2020   Procedure: REVISION OPEN REDUCTION INTERNAL FIXATION (ORIF) ELBOW/OLECRANON FRACTURE;  Surgeon: Roby Lofts, MD;  [facility];  Service: Orthopedics;   Laterality: Right;   ORIF ULNAR FRACTURE Right 01/13/2020   Procedure: OPEN TREATMENT OF COMPLEX RIGHT PROXIMAL ULNA FRACTURE;  Surgeon: Mack Hook, MD;  Location: [facility];  Service: Orthopedics;  Laterality: Right;  PROCEDURE: OPEN TREATMENT OF COMPLEX RIGHT PROXIMAL ULNA FRACTURE WITH POSSIBLE COLLATERAL LIGAMENT REPAIRS LENGTH OF SURGERY: 2.5 [place] + REGIONAL BLOCK    Family History  Problem Relation Age of Onset   Pulmonary fibrosis Mother    Hypertension Mother    Pancreatic cancer Maternal Grandmother    Rheum arthritis Paternal Aunt    Cancer Paternal Grandmother    Breast cancer Paternal Grandmother    COPD Paternal Grandfather    CAD Paternal Grandfather    Colon cancer Neg Hx    Prostate cancer Neg Hx    Diabetes Neg Hx    Thyroid disease Neg Hx    Liver cancer Neg Hx    Stomach cancer Neg Hx    Rectal cancer Neg Hx     Allergies  Allergen Reactions   Sulfonamide Derivatives Other (See Comments)    UNSPECIFIED   Bee Venom Palpitations    unknown    Current Outpatient Medications on File Prior to Visit  Medication Sig Dispense Refill   clobetasol cream (TEMOVATE) 0.05 % Apply topically 2 (two) times daily as needed. 30 g 1   enalapril (VASOTEC) 5 MG tablet TAKE 1 TABLET BY MOUTH EVERY DAY 90 tablet 1   Ixekizumab (TALTZ) 80 MG/ML SOAJ Inject 80 mg into the skin every 28 (twenty-eight) days. 3 mL 0   No current facility-administered medications on file prior to visit.    BP 137/89    Pulse 79    Temp 98.2 F (36.8 C)    Resp 18    Ht 5\' 10"  (1.778 m)    Wt (!) 324 lb (147 kg)    SpO2 94%    BMI 46.49 kg/m       Objective:   Physical Exam  General Mental Status- Alert. General Appearance- Not in acute distress.   Skin General: Color- Normal Color. Moisture- Normal Moisture.  Neck Carotid Arteries- Normal color. Moisture- Normal Moisture. No carotid bruits. No JVD.  Chest and Lung Exam Auscultation: Breath  Sounds:-Normal.  Cardiovascular Auscultation:Rythm- Regular. Murmurs & Other Heart Sounds:Auscultation of the heart reveals- No Murmurs.  Abdomen Inspection:-Inspeection Normal. Palpation/Percussion:Note:No mass. Palpation and Percussion of the abdomen reveal- Non Tender, Non Distended + BS, no rebound or guarding.  Neurologic Cranial Nerve exam:- CN III-XII intact(No nystagmus), symmetric smile. Strength:- 5/5 equal and symmetric strength both upper and lower extremities.       Assessment & Plan:  For you wellness exam today I have ordered lipid panel. Recent cbc and cmp done early in month.  Vaccine given today. 3 covid vaccines. Will give flu vaccine and pcv 20 vaccine today. Can scheduled for tdap as nurse visit on  separate.   Consider yearly covid booster. If you want can get that down stairs.  Recommend exercise and healthy diet.  We will let you know lab results as they come in.  Follow up date appointment will be determined after lab review.    Htn- decently controlled today and not on med. Refilled today.  For psoriatic arthritis continue taltz thru specialist  Esperanza RichtersEdward Vayda Dungee, PA-C

## 2021-11-20 NOTE — Patient Instructions (Addendum)
For you wellness exam today I have ordered lipid panel. Recent cbc and cmp done early in month.  Vaccine given today. 3 covid vaccines. Will give flu vaccine and pcv 20 vaccine today. Can scheduled for tdap as nurse visit on separate.   Consider yearly covid booster. If you want can get that down stairs.  Recommend exercise and healthy diet.  We will let you know lab results as they come in.  Follow up date appointment will be determined after lab review.    Htn- decently controlled today and not on med. Refilled today.  For psoriatic arthritis continue taltz thru specialist  Preventive Care 28-45 Years Old, Male Preventive care refers to lifestyle choices and visits with your health care provider that can promote health and wellness. Preventive care visits are also called wellness exams. What can I expect for my preventive care visit? Counseling During your preventive care visit, your health care provider may ask about your: Medical history, including: Past medical problems. Family medical history. Current health, including: Emotional well-being. Home life and relationship well-being. Sexual activity. Lifestyle, including: Alcohol, nicotine or tobacco, and drug use. Access to firearms. Diet, exercise, and sleep habits. Safety issues such as seatbelt and bike helmet use. Sunscreen use. Work and work Astronomer. Physical exam Your health care provider may check your: Height and weight. These may be used to calculate your BMI (body mass index). BMI is a measurement that tells if you are at a healthy weight. Waist circumference. This measures the distance around your waistline. This measurement also tells if you are at a healthy weight and may help predict your risk of certain diseases, such as type 2 diabetes and high blood pressure. Heart rate and blood pressure. Body temperature. Skin for abnormal spots. What immunizations do I need? Vaccines are usually given at various  ages, according to a schedule. Your health care provider will recommend vaccines for you based on your age, medical history, and lifestyle or other factors, such as travel or where you work. What tests do I need? Screening Your health care provider may recommend screening tests for certain conditions. This may include: Lipid and cholesterol levels. Diabetes screening. This is done by checking your blood sugar (glucose) after you have not eaten for a while (fasting). Hepatitis B test. Hepatitis C test. HIV (human immunodeficiency virus) test. STI (sexually transmitted infection) testing, if you are at risk. Talk with your health care provider about your test results, treatment options, and if necessary, the need for more tests. Follow these instructions at home: Eating and drinking  Eat a healthy diet that includes fresh fruits and vegetables, whole grains, lean protein, and low-fat dairy products. Drink enough fluid to keep your urine pale yellow. Take vitamin and mineral supplements as recommended by your health care provider. Do not drink alcohol if your health care provider tells you not to drink. If you drink alcohol: Limit how much you have to 0-2 drinks a day. Know how much alcohol is in your drink. In the [place] one drink equals one 12 oz bottle of beer (355 mL), one 5 oz glass of wine (148 mL), or one 1 oz glass of hard liquor (44 mL). Lifestyle Brush your teeth every morning and night with fluoride toothpaste. Floss one time each day. Exercise for at least 30 minutes 5 or more days each week. Do not use any products that contain nicotine or tobacco. These products include cigarettes, chewing tobacco, and vaping devices, such as e-cigarettes. If you need  help quitting, ask your health care provider. Do not use drugs. If you are sexually active, practice safe sex. Use a condom or other form of protection to prevent STIs. Find healthy ways to manage stress, such as: Meditation,  yoga, or listening to music. Journaling. Talking to a trusted person. Spending time with friends and family. Minimize exposure to UV radiation to reduce your risk of skin cancer. Safety Always wear your seat belt while driving or riding in a vehicle. Do not drive: If you have been drinking alcohol. Do not ride with someone who has been drinking. If you have been using any mind-altering substances or drugs. While texting. When you are tired or distracted. Wear a helmet and other protective equipment during sports activities. If you have firearms in your house, make sure you follow all gun safety procedures. Seek help if you have been physically or sexually abused. What's next? Go to your health care provider once a year for an annual wellness visit. Ask your health care provider how often you should have your eyes and teeth checked. Stay up to date on all vaccines. This information is not intended to replace advice given to you by your health care provider. Make sure you discuss any questions you have with your health care provider. Document Revised: 04/24/2021 Document Reviewed: 04/24/2021 Elsevier Patient Education  2022 [employer].

## 2021-11-20 NOTE — Addendum Note (Signed)
Addended by: Jeronimo Greaves on: 11/20/2021 10:13 AM   Modules accepted: Orders

## 2021-11-20 NOTE — Addendum Note (Signed)
Addended by: Anabel Halon on: 11/20/2021 10:15 AM   Modules accepted: Orders

## 2021-11-21 ENCOUNTER — Other Ambulatory Visit (HOSPITAL_COMMUNITY): Payer: Self-pay

## 2021-12-11 ENCOUNTER — Other Ambulatory Visit: Payer: Self-pay | Admitting: Rheumatology

## 2021-12-11 ENCOUNTER — Other Ambulatory Visit (HOSPITAL_COMMUNITY): Payer: Self-pay

## 2021-12-11 DIAGNOSIS — L409 Psoriasis, unspecified: Secondary | ICD-10-CM

## 2021-12-11 DIAGNOSIS — L4050 Arthropathic psoriasis, unspecified: Secondary | ICD-10-CM

## 2021-12-11 MED ORDER — TALTZ 80 MG/ML ~~LOC~~ SOAJ
80.0000 mg | SUBCUTANEOUS | 0 refills | Status: DC
  Filled 2021-12-16: qty 1, 28d supply, fill #0
  Filled 2022-01-15: qty 1, 28d supply, fill #1
  Filled 2022-02-07: qty 1, 28d supply, fill #2

## 2021-12-11 NOTE — Telephone Encounter (Signed)
Next Visit: 04/15/2022  Last Visit: 11/14/2021  Last Fill: 09/11/2021  [id number] arthritis   Current Dose per office note 11/14/2021: Taltz 80 mg sq injections every month  Labs: 11/14/2021 CBC stable. CMP WNL.   TB Gold: 11/14/2021 Neg    Okay to refill Taltz?

## 2021-12-16 ENCOUNTER — Other Ambulatory Visit (HOSPITAL_COMMUNITY): Payer: Self-pay

## 2021-12-18 ENCOUNTER — Other Ambulatory Visit (HOSPITAL_COMMUNITY): Payer: Self-pay

## 2021-12-31 ENCOUNTER — Encounter: Payer: BC Managed Care – PPO | Admitting: Vascular Surgery

## 2021-12-31 ENCOUNTER — Encounter (HOSPITAL_COMMUNITY): Payer: BC Managed Care – PPO

## 2022-01-07 ENCOUNTER — Other Ambulatory Visit: Payer: Self-pay | Admitting: *Deleted

## 2022-01-07 DIAGNOSIS — I8393 Asymptomatic varicose veins of bilateral lower extremities: Secondary | ICD-10-CM

## 2022-01-09 ENCOUNTER — Other Ambulatory Visit (HOSPITAL_COMMUNITY): Payer: Self-pay

## 2022-01-13 DIAGNOSIS — Z3141 Encounter for fertility testing: Secondary | ICD-10-CM | POA: Diagnosis not present

## 2022-01-15 ENCOUNTER — Other Ambulatory Visit (HOSPITAL_COMMUNITY): Payer: Self-pay

## 2022-01-17 ENCOUNTER — Ambulatory Visit (HOSPITAL_COMMUNITY)
Admission: RE | Admit: 2022-01-17 | Discharge: 2022-01-17 | Disposition: A | Discharge status: Home / Self Care | Payer: BC Managed Care – PPO | Source: Ambulatory Visit | Attending: Surgery | Admitting: Surgery

## 2022-01-17 ENCOUNTER — Other Ambulatory Visit: Payer: Self-pay

## 2022-01-17 ENCOUNTER — Encounter: Payer: Self-pay | Admitting: Surgery

## 2022-01-17 ENCOUNTER — Ambulatory Visit (INDEPENDENT_AMBULATORY_CARE_PROVIDER_SITE_OTHER): Payer: BC Managed Care – PPO | Admitting: Surgery

## 2022-01-17 VITALS — BP 111/78 | HR 79 | Temp 97.9°F | Resp 20 | Ht 70.0 in | Wt 315.0 lb

## 2022-01-17 DIAGNOSIS — I83893 Varicose veins of bilateral lower extremities with other complications: Secondary | ICD-10-CM

## 2022-01-17 DIAGNOSIS — M7989 Other specified soft tissue disorders: Secondary | ICD-10-CM

## 2022-01-17 DIAGNOSIS — I8393 Asymptomatic varicose veins of bilateral lower extremities: Secondary | ICD-10-CM | POA: Diagnosis not present

## 2022-01-17 NOTE — Progress Notes (Signed)
[facility] [place]  Patient name: Joseph [name] MRN: [id number] DOB: 03/14/1981 Sex: male   REQUESTING PROVIDER:    Dr. Corliss Skains   REASON FOR CONSULT:    Varicose veins  HISTORY OF PRESENT ILLNESS:   Joseph [name] is a 41 y.o. male, who is referred for evaluation of leg swelling and varicose veins.  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  His wife notes that he has had large ropelike varicosities for a long time.  He stated that his legs bother him at the end of the day.  He will occasionally take Tylenol which does help.  He also tries to keep his legs elevated.  He denies any prior history of DVT.  He has not had any bleeding episodes.  Patient has a history of psoriatic arthritis and psoriasis.  He has a known right sided aortic arch he is a non-smoker.   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 Antoine Poche 02/2009 with echo, as needed f/u   Torn meniscus    right knee     FAMILY HISTORY   Family History  Problem Relation Age of Onset   Pulmonary fibrosis Mother    Hypertension Mother    Pancreatic cancer Maternal Grandmother    Rheum arthritis Paternal Aunt    Cancer Paternal Grandmother    Breast cancer Paternal Grandmother    COPD Paternal Grandfather    CAD Paternal Grandfather    Colon cancer Neg Hx    Prostate cancer Neg Hx    Diabetes Neg Hx     Thyroid disease Neg Hx    Liver cancer Neg Hx    Stomach cancer Neg Hx    Rectal cancer Neg Hx     SOCIAL HISTORY:   Social History   Socioeconomic History   Marital status: Married    Spouse name: Shanda Bumps   Number of children: 0   Years of education: 12   Highest education level: Not on file  Occupational History   Not on file  Tobacco Use   Smoking status: Never   Smokeless tobacco: Never  Vaping Use   Vaping Use: Never used  Substance and Sexual Activity   Alcohol use: No   Drug use: No   Sexual activity: Yes  Other Topics Concern   Not on file  Social History Narrative   Right handed   Caffeien use:1 soda per day   Social Determinants of Health   Financial Resource Strain: Not on file  Food Insecurity: Not on file  Transportation Needs: Not on file  Physical Activity: Not on file  Stress: Not on file  Social Connections: Not on file  Intimate Partner Violence: Not on file    ALLERGIES:    Allergies  Allergen Reactions   Sulfonamide Derivatives Other (See Comments)    UNSPECIFIED  Bee Venom Palpitations    unknown    CURRENT MEDICATIONS:    Current Outpatient Medications  Medication Sig Dispense Refill   clobetasol cream (TEMOVATE) 0.05 % Apply topically 2 (two) times daily as needed. 30 g 1   enalapril (VASOTEC) 5 MG tablet Take 1 tablet (5 mg total) by mouth daily. 90 tablet 3   Ixekizumab (TALTZ) 80 MG/ML SOAJ Inject 80 mg into the skin every 28 (twenty-eight) days. 3 mL 0   No current facility-administered medications for this visit.    REVIEW OF SYSTEMS:   [X]  denotes positive finding, [ ]  denotes negative finding Cardiac  Comments:  Chest pain or chest pressure:    Shortness of breath upon exertion:    Short of breath when lying flat:    Irregular heart rhythm:        Vascular    Pain in calf, thigh, or hip brought on by ambulation:    Pain in feet at night that wakes you up from your sleep:     Blood clot in your veins:     Leg swelling:  x       Pulmonary    Oxygen at home:    Productive cough:     Wheezing:         Neurologic    Sudden weakness in arms or legs:     Sudden numbness in arms or legs:     Sudden onset of difficulty speaking or slurred speech:    Temporary loss of vision in one eye:     Problems with dizziness:         Gastrointestinal    Blood in stool:      Vomited blood:         Genitourinary    Burning when urinating:     Blood in urine:        Psychiatric    Major depression:         Hematologic    Bleeding problems:    Problems with blood clotting too easily:        Skin    Rashes or ulcers:        Constitutional    Fever or chills:     PHYSICAL EXAM:   Vitals:   01/17/22 1353  BP: 111/78  Pulse: 79  Resp: 20  Temp: 97.9 F (36.6 C)  SpO2: 94%  Weight: (!) 315 lb (142.9 kg)  Height: 5\' 10"  (1.778 m)    GENERAL: The patient is a well-nourished male, in no acute distress. The vital signs are documented above. CARDIAC: There is a regular rate and rhythm.  VASCULAR: 1+ bilateral pitting edema PULMONARY: Nonlabored respirations ABDOMEN: Soft and non-tender with normal pitched bowel sounds.  MUSCULOSKELETAL: There are no major deformities or cyanosis. NEUROLOGIC: No focal weakness or paresthesias are detected. SKIN: See photo below PSYCHIATRIC: The patient has a normal affect.     STUDIES:   I have reviewed the following studies: Venous Reflux Times  +----------------+---------+------+-----------+------------+--------+   RIGHT            Reflux No Reflux Reflux Time Diameter cms Comments                                Yes                                       +----------------+---------+------+-----------+------------+--------+  CFV                         yes    >1 second                          +----------------+---------+------+-----------+------------+--------+   FV prox          no                                                    +----------------+---------+------+-----------+------------+--------+   FV mid           no                                                   +----------------+---------+------+-----------+------------+--------+   FV dist          no                                                   +----------------+---------+------+-----------+------------+--------+   Popliteal        no                                                   +----------------+---------+------+-----------+------------+--------+   GSV at SFJ                  yes     >500 ms      0.925                +----------------+---------+------+-----------+------------+--------+   GSV prox thigh   no                              0.609                +----------------+---------+------+-----------+------------+--------+   GSV mid thigh    no                              0.452                +----------------+---------+------+-----------+------------+--------+   GSV dist thigh   no                              0.459                +----------------+---------+------+-----------+------------+--------+   GSV at knee                 yes     >500 ms      0.781                +----------------+---------+------+-----------+------------+--------+   GSV prox calf    no  0.377                +----------------+---------+------+-----------+------------+--------+   SSV Pop Fossa    no                               0.34                +----------------+---------+------+-----------+------------+--------+   SSV prox calf    no                              0.323                +----------------+---------+------+-----------+------------+--------+   SSV mid calf     no                              0.403                +----------------+---------+------+-----------+------------+--------+   AASV at [facility]                 yes     >500 ms       1.87                +----------------+---------+------+-----------+------------+--------+   VV  mid post calf            yes     >500 ms                           +----------------+---------+------+-----------+------------+--------+  ASSESSMENT and PLAN   CEAP class III: I discussed with the patient and his wife that he has significant venous reflux in the right saphenous vein with large diameters.  It measures 0.92 cm at the saphenofemoral junction.  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  [name] having significant issues with pain and swelling.  He has never worn compression stockings, and so I discussed starting with 20-30 thigh-high compression socks to see how this alleviates some of his symptoms.  I will have him follow-up with me again in 3 months.  At that time we will discuss proceeding with endovenous laser ablation of the right saphenous vein and greater than 20 stabs   Charlena Cross, MD, FACS [facility] [phone] Pager [phone]

## 2022-01-21 ENCOUNTER — Other Ambulatory Visit (HOSPITAL_COMMUNITY): Payer: Self-pay

## 2022-02-04 ENCOUNTER — Other Ambulatory Visit (HOSPITAL_COMMUNITY): Payer: Self-pay

## 2022-02-07 ENCOUNTER — Other Ambulatory Visit (HOSPITAL_COMMUNITY): Payer: Self-pay

## 2022-02-18 ENCOUNTER — Other Ambulatory Visit (HOSPITAL_COMMUNITY): Payer: Self-pay

## 2022-03-11 ENCOUNTER — Other Ambulatory Visit: Payer: Self-pay | Admitting: Rheumatology

## 2022-03-11 ENCOUNTER — Other Ambulatory Visit (HOSPITAL_COMMUNITY): Payer: Self-pay

## 2022-03-11 DIAGNOSIS — L4050 Arthropathic psoriasis, unspecified: Secondary | ICD-10-CM

## 2022-03-11 DIAGNOSIS — L409 Psoriasis, unspecified: Secondary | ICD-10-CM

## 2022-03-11 MED ORDER — TALTZ 80 MG/ML ~~LOC~~ SOAJ
80.0000 mg | SUBCUTANEOUS | 0 refills | Status: DC
  Filled 2022-03-11: qty 1, 28d supply, fill #0

## 2022-03-11 NOTE — Telephone Encounter (Signed)
Next Visit: 04/28/2022 ? ?Last Visit: 11/14/2021 ? ?Last Fill: 12/11/2021 ? ?[id number] arthritis  ? ?Current Dose per office note 11/14/2021: Taltz 80 mg sq injections every month ? ?Labs: 11/14/2021 CBC stable. CMP WNL.  ? ?TB Gold: 11/14/2021 Neg   ? ?Left message to advise patient he is due to update labs.  ? ?Okay to refill Donnetta Hail?  ?

## 2022-03-17 ENCOUNTER — Other Ambulatory Visit (HOSPITAL_COMMUNITY): Payer: Self-pay

## 2022-03-24 DIAGNOSIS — F4321 Adjustment disorder with depressed mood: Secondary | ICD-10-CM | POA: Diagnosis not present

## 2022-03-31 DIAGNOSIS — F4321 Adjustment disorder with depressed mood: Secondary | ICD-10-CM | POA: Diagnosis not present

## 2022-04-08 ENCOUNTER — Other Ambulatory Visit (HOSPITAL_COMMUNITY): Payer: Self-pay

## 2022-04-09 ENCOUNTER — Other Ambulatory Visit: Payer: Self-pay | Admitting: Physician Assistant

## 2022-04-09 ENCOUNTER — Other Ambulatory Visit (HOSPITAL_COMMUNITY): Payer: Self-pay

## 2022-04-09 DIAGNOSIS — L4050 Arthropathic psoriasis, unspecified: Secondary | ICD-10-CM

## 2022-04-09 DIAGNOSIS — L409 Psoriasis, unspecified: Secondary | ICD-10-CM

## 2022-04-10 ENCOUNTER — Other Ambulatory Visit (HOSPITAL_COMMUNITY): Payer: Self-pay

## 2022-04-10 ENCOUNTER — Telehealth: Payer: Self-pay | Admitting: Pharmacy Technician

## 2022-04-10 NOTE — Telephone Encounter (Signed)
Submitted a Prior Authorization request to [facility] for TALTZ via CoverMyMeds. Will update once we receive a response.   Key: [id number]

## 2022-04-14 ENCOUNTER — Other Ambulatory Visit: Payer: Self-pay | Admitting: *Deleted

## 2022-04-14 ENCOUNTER — Telehealth: Payer: Self-pay | Admitting: Rheumatology

## 2022-04-14 ENCOUNTER — Other Ambulatory Visit: Payer: Self-pay | Admitting: Rheumatology

## 2022-04-14 DIAGNOSIS — L4050 Arthropathic psoriasis, unspecified: Secondary | ICD-10-CM | POA: Diagnosis not present

## 2022-04-14 DIAGNOSIS — Z79899 Other long term (current) drug therapy: Secondary | ICD-10-CM

## 2022-04-14 DIAGNOSIS — L409 Psoriasis, unspecified: Secondary | ICD-10-CM

## 2022-04-14 NOTE — Telephone Encounter (Signed)
Patient stopped by the office for labwork.  No phlebotomist was in the office and no one was available to print out labwork orders for patient to bring to [facility].  Patient states he is leaving tomorrow morning 04/15/22 at 6:00 am for a week and was told he needed to stop by the office for labwork before they would give him a sample of Taltz.   Please advise.

## 2022-04-14 NOTE — Telephone Encounter (Signed)
Received notification from [facility] regarding a prior authorization for [facility]. Authorization has been APPROVED from 04/10/22 to 04/09/23.    Authorization # Key: [id number]

## 2022-04-14 NOTE — Telephone Encounter (Signed)
LMOM labs released 

## 2022-04-14 NOTE — Telephone Encounter (Signed)
LMOM labs released to [facility], pending results.

## 2022-04-14 NOTE — Telephone Encounter (Signed)
Patient requested labwork orders be sent to [facility] at [address] in [place].  Patient states he leaves tomorrow and requesting the orders be sent today, 04/14/22.

## 2022-04-15 ENCOUNTER — Ambulatory Visit: Payer: BC Managed Care – PPO | Admitting: Rheumatology

## 2022-04-15 ENCOUNTER — Other Ambulatory Visit (HOSPITAL_COMMUNITY): Payer: Self-pay

## 2022-04-15 LAB — CBC WITH DIFFERENTIAL/PLATELET
Basophils Absolute: 0.1 10*3/uL (ref 0.0–0.2)
Basos: 1 %
EOS (ABSOLUTE): 0.3 10*3/uL (ref 0.0–0.4)
Eos: 3 %
Hematocrit: 46.1 % (ref 37.5–51.0)
Hemoglobin: 15.1 g/dL (ref 13.0–17.7)
Immature Grans (Abs): 0.1 10*3/uL (ref 0.0–0.1)
Immature Granulocytes: 1 %
Lymphocytes Absolute: 2.2 10*3/uL (ref 0.7–3.1)
Lymphs: 21 %
MCH: 26.8 pg (ref 26.6–33.0)
MCHC: 32.8 g/dL (ref 31.5–35.7)
MCV: 82 fL (ref 79–97)
Monocytes Absolute: 0.8 10*3/uL (ref 0.1–0.9)
Monocytes: 8 %
Neutrophils Absolute: 7.3 10*3/uL — ABNORMAL HIGH (ref 1.4–7.0)
Neutrophils: 66 %
Platelets: 216 10*3/uL (ref 150–450)
RBC: 5.64 x10E6/uL (ref 4.14–5.80)
RDW: 15 % (ref 11.6–15.4)
WBC: 10.8 10*3/uL (ref 3.4–10.8)

## 2022-04-15 LAB — CMP14+EGFR
ALT: 14 IU/L (ref 0–44)
AST: 15 IU/L (ref 0–40)
Albumin/Globulin Ratio: 1 — ABNORMAL LOW (ref 1.2–2.2)
Albumin: 4.2 g/dL (ref 4.0–5.0)
Alkaline Phosphatase: 128 IU/L — ABNORMAL HIGH (ref 44–121)
BUN/Creatinine Ratio: 8 — ABNORMAL LOW (ref 9–20)
BUN: 9 mg/dL (ref 6–20)
Bilirubin Total: 0.4 mg/dL (ref 0.0–1.2)
CO2: 24 mmol/L (ref 20–29)
Calcium: 8.8 mg/dL (ref 8.7–10.2)
Chloride: 100 mmol/L (ref 96–106)
Creatinine, Ser: 1.13 mg/dL (ref 0.76–1.27)
Globulin, Total: 4.1 g/dL (ref 1.5–4.5)
Glucose: 110 mg/dL — ABNORMAL HIGH (ref 70–99)
Potassium: 4 mmol/L (ref 3.5–5.2)
Sodium: 139 mmol/L (ref 134–144)
Total Protein: 8.3 g/dL (ref 6.0–8.5)
eGFR: 85 mL/min/{1.73_m2} (ref >59)

## 2022-04-15 MED ORDER — TALTZ 80 MG/ML ~~LOC~~ SOAJ
80.0000 mg | SUBCUTANEOUS | 0 refills | Status: DC
  Filled 2022-04-15: qty 3, 84d supply, fill #0

## 2022-04-15 NOTE — Telephone Encounter (Signed)
Attempted to contact the patient and left message for patient to call the office   Next Visit: 04/28/2022  Last Visit: 11/14/2021  Last Fill: 03/11/2022 (30 day supply)  [id number] arthritis   Current Dose per office note 11/14/2021: Taltz 80 mg sq injections every month  Labs: 04/14/2022 CBC and CMP are stable.  TB Gold: 11/14/2021 Neg    Okay to refill Taltz?

## 2022-04-15 NOTE — Progress Notes (Signed)
CBC and CMP are stable.

## 2022-04-16 ENCOUNTER — Other Ambulatory Visit (HOSPITAL_COMMUNITY): Payer: Self-pay

## 2022-04-16 NOTE — Progress Notes (Signed)
Office Visit Note  Patient: Joseph [name]             Date of Birth: Dec 28, 1980           MRN: [id number]             PCP: Elise Benne Referring: Elise Benne Visit Date: 04/28/2022 Occupation: @GUAROCC @  Subjective:  Medication monitoring  History of Present Illness: Joseph [name] is a 41 y.o. male with history of psoriatic arthritis and osteoarthritis.  He remains on taltz 80 mg sq injections every month.  He has not missed any doses of Taltz recently.  He has been tolerating Taltz without any side effects or recurrent infections.  He experiences occasional stiffness in his hands and knee joints but denies any joint swelling.  He denies any Achilles size or planter fasciitis.  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  He states that overall his pain from overuse activities has improved.  He has been taking Tylenol less frequently since he has not been working in a warehouse.  He denies any psoriasis at this time.  He has a prescription for clobetasol cream which he uses as needed.  He denies any SI joint discomfort. He denies any new medical conditions.    Activities of Daily Living:  Patient reports morning stiffness for 0  none .   Patient Denies nocturnal pain.  Difficulty dressing/grooming: Denies Difficulty climbing stairs: Denies Difficulty getting out of chair: Denies Difficulty using hands for taps, buttons, cutlery, and/or writing: Denies  Review of Systems  Constitutional:  Negative for fatigue.  HENT:  Negative for mouth dryness.   Eyes:  Negative for dryness.  Respiratory:  Negative for shortness of breath.   Cardiovascular:  Negative for swelling in legs/feet.  Gastrointestinal:  Negative for constipation.  Endocrine: Positive for cold intolerance and heat intolerance.  Genitourinary:  Negative for difficulty urinating.  Musculoskeletal:  Negative for morning stiffness.  Skin:  Negative for rash.   Allergic/Immunologic: Negative for susceptible to infections.  Neurological:  Negative for numbness.  Hematological:  Negative for bruising/bleeding tendency.  Psychiatric/Behavioral:  Positive for sleep disturbance.     PMFS History:  Patient Active Problem List   Diagnosis Date Noted   Acute medial meniscus tear of right knee    Elbow dislocation, right, initial encounter 03/06/2020   Closed disp fx of right olecranon with intraartic exten w malunion 02/14/2020   Morbid obesity with BMI of 45.0-49.9, adult ([place]) 11/25/2019   Hair loss 12/31/2017   Varicose veins of both lower extremities 07/26/2017   Iron deficiency anemia 09/21/2014   Psoriatic arthritis ([place]) 09/21/2014   ESSENTIAL HYPERTENSION, MALIGNANT 02/18/2009   Nonspecific (abnormal) findings on radiological and other examination of body structure 02/16/2009   CHEST XRAY, ABNORMAL 02/16/2009    Past Medical History:  Diagnosis Date   Acute sinus infection 11/07/2014   Arthritis    Blood transfusion without reported diagnosis    At [facility]    Difficult intubation 01/13/2020   difficult airway in OR (per Dr Laurey Arrow)   Iron deficiency anemia 09/21/2014   Psoriasis    Psoriatic arthritis (H[facility])    Psoriatic arthritis ([place])    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 Percival Spanish 02/2009 with echo, as needed f/u   Torn meniscus    right knee  Family History  Problem Relation Age of Onset   Pulmonary fibrosis Mother    Hypertension Mother    Pancreatic cancer Maternal Grandmother    Rheum arthritis Paternal Aunt    Cancer Paternal Grandmother    Breast cancer Paternal Grandmother    COPD Paternal Grandfather    CAD Paternal Grandfather    Colon cancer Neg Hx    Prostate cancer Neg Hx    Diabetes Neg Hx    Thyroid disease Neg Hx    Liver cancer Neg Hx    Stomach cancer Neg Hx    Rectal cancer Neg Hx     Past Surgical History:  Procedure Laterality Date   COLONOSCOPY WITH ESOPHAGOGASTRODUODENOSCOPY (EGD)     EXTERNAL FIXATION REMOVAL Right 03/22/2020   Procedure: REMOVAL EXTERNAL FIXATION ARM;  Surgeon: Shona Needles, MD;  Location: [place];  Service: Orthopedics;  Laterality: Right;   HERNIA REPAIR  1994   umbicial   INNER EAR SURGERY Left    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, 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   KNEE ARTHROSCOPY Right 05/10/2021   Procedure: ARTHROSCOPY KNEE partial medial meniscectomy;  Surgeon: Meredith Pel, MD;  Location: [place];  Service: Orthopedics;  Laterality: Right;   LIGAMENT REPAIR Right 01/13/2020   Procedure: COLLATERAL LIGAMENT REPAIRS;  Surgeon: Milly Jakob, MD;  Location: [place];  Service: Orthopedics;  Laterality: Right;   ORIF ELBOW FRACTURE Right 02/17/2020   Procedure: REVISION OPEN REDUCTION INTERNAL FIXATION (ORIF) ELBOW/OLECRANON FRACTURE;  Surgeon: Shona Needles, MD;  Location: [place];  Service: Orthopedics;  Laterality: Right;   ORIF ULNAR FRACTURE Right 01/13/2020   Procedure: OPEN TREATMENT OF COMPLEX RIGHT PROXIMAL ULNA FRACTURE;  Surgeon: Milly Jakob, MD;  Location: [place];  Service: Orthopedics;  Laterality: Right;  PROCEDURE: OPEN TREATMENT OF COMPLEX RIGHT PROXIMAL ULNA FRACTURE WITH POSSIBLE COLLATERAL LIGAMENT REPAIRS LENGTH OF SURGERY: 2.5 [place] + REGIONAL BLOCK   Social History   Social History Narrative   Right handed   Caffeien use:1 soda per day   Immunization History  Administered Date(s) Administered   Influenza,inj,Quad PF,6+ Mos 11/20/2021   MMR 02/12/2001   PFIZER Comirnaty(Gray Top)Covid-19 Tri-Sucrose Vaccine 09/21/2020, 10/12/2020   PNEUMOCOCCAL CONJUGATE-20 11/20/2021   Pneumococcal Polysaccharide-23 11/10/2012   Td 08/17/2001     Objective: Vital Signs: BP 120/82 (BP Location: Left Arm, Patient Position: Sitting, Cuff Size: Large)   Pulse 90   Resp 16   Ht 5'  10" (1.778 m)   Wt (!) 304 lb (137.9 kg)   BMI 43.62 kg/m    Physical Exam Vitals and nursing note reviewed.  Constitutional:      Appearance: [name] well-developed.  HENT:     Head: Normocephalic and atraumatic.  Eyes:     Conjunctiva/sclera: Conjunctivae normal.     Pupils: Pupils are equal, round, and reactive to light.  Cardiovascular:     Rate and Rhythm: Normal rate and regular rhythm.     Heart sounds: Normal heart sounds.  Pulmonary:     Effort: Pulmonary effort is normal.     Breath sounds: Normal breath sounds.  Abdominal:     General: Bowel sounds are normal.     Palpations: Abdomen is soft.  Musculoskeletal:     Cervical back: Normal range of motion and neck supple.  Skin:    General: Skin is warm and dry.     Capillary Refill: Capillary refill takes less than 2 seconds.  Neurological:  Mental Status: [name] alert and oriented to person, place, and time.  Psychiatric:        Behavior: Behavior normal.      Musculoskeletal Exam: C-spine, thoracic spine, lumbar spine have good range of motion.  No midline spinal tenderness or SI joint tenderness.  Shoulder joints have good range of motion with no discomfort or tenderness.  Right elbow joint contracture noted.  Wrist joints, MCPs, PIPs, DIPs have good range of motion with no synovitis.  PIP and DIP thickening consistent with osteoarthritis of both hands.  Complete fist formation bilaterally.  Hip joints have good range of motion with no groin pain.  Knee joints have good range of motion with no warmth or effusion.  Ankle joints have good range of motion with no joint tenderness.  No evidence of Achilles tendinitis.  Mild pedal edema noted bilaterally.  CDAI Exam: CDAI Score: -- Patient Global: --; Provider Global: -- Swollen: --; Tender: -- Joint Exam 04/28/2022   No joint exam has been documented for this visit   There is currently no information documented on the homunculus. Go to the Rheumatology activity and  complete the homunculus joint exam.  Investigation: No additional findings.  Imaging: No results found.  Recent Labs: Lab Results  Component Value Date   WBC 10.8 04/14/2022   HGB 15.1 04/14/2022   PLT 216 04/14/2022   NA 139 04/14/2022   K 4.0 04/14/2022   CL 100 04/14/2022   CO2 24 04/14/2022   GLUCOSE 110 (H) 04/14/2022   BUN 9 04/14/2022   CREATININE 1.13 04/14/2022   BILITOT 0.4 04/14/2022   ALKPHOS 128 (H) 04/14/2022   AST 15 04/14/2022   ALT 14 04/14/2022   PROT 8.3 04/14/2022   ALBUMIN 4.2 04/14/2022   CALCIUM 8.8 04/14/2022   GFRAA 112 04/19/2021   QFTBGOLD Negative 04/03/2017   QFTBGOLDPLUS NEGATIVE 11/14/2021    Speciality Comments: Prior therapy: Enbrel/Humira (inadequate response)  and Remicaide (d/c due to cost and time consuming infusion), Cosentyx stopped 04/19/21. Talt started 05/27/21  FU q 3 months  Procedures:  No procedures performed Allergies: Sulfonamide derivatives and Bee venom    Assessment / Plan:     Visit Diagnoses: Psoriatic arthritis ([place]): He has no synovitis or dactylitis on examination today.  He has not had any signs or symptoms of a psoriatic arthritis flare.  He has clinically been doing well on Taltz 80 mg subcu days injections every 28 days.  [name] tolerating Taltz without any side effects or injection site reactions.  He has not had any recurrent infections.  He has no active psoriasis at this time.  No SI joint tenderness upon palpation.  He has not had any nocturnal pain.  No evidence of Achilles tendinitis or planter fasciitis was noted on examination today.  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  He has had some increase stiffness in his hands and knees which he attributes to sitting for prolonged periods of time while driving.  He has been taking less Tylenol over-the-counter since switching jobs. Overall his psoriatic arthritis seems well controlled on the current treatment  regimen.  He will remain on Taltz as monotherapy.  He was advised to notify us if he develops signs or symptoms of a flare.  He will follow-up in the office in 5 months or sooner if needed.  Psoriasis: He has no active psoriasis at this time.  He has a prescription for clobetasol cream  which she can apply topically as needed.  He will remain on Taltz as prescribed.  High risk medication use - Taltz 80 mg sq injections every 28 days.  Previously had inadequate response to Enbrel, Humira, cosentyx and Remicade. CBC and CMP updated on 04/14/22.  He will be due to update lab work in September and every 3 months.  Standing orders for CBC and CMP are in place.  TB gold negative on 11/14/21. He has not had any recent infections. Discussed the importance of holding taltz if he develops signs or symptoms of an infection and to resume once the infection has completely cleared.    Varicose veins of both lower extremities, unspecified whether complicated: He wears compression stockings intermittently.  Primary osteoarthritis of both hands: He has some PIP and DIP thickening consistent with osteoarthritis of both hands.  X-rays of both hands were obtained on 04/23/2021 and results were further discussed today in the office.  Discussed the importance of joint protection and muscle strengthening.  Primary osteoarthritis of left knee: He has good range of motion of the left knee joint on examination today.  No warmth or effusion was noted.  Tendinopathy of right shoulder: He has good range of motion of the right shoulder with no discomfort at this time.  No tenderness upon palpation noted.  History of torn meniscus of right knee: MRI on 04/06/2021.  Chondromalacia patellae of right knee: Crepitus noted on examination today.  No warmth or effusion noted.  Closed fracture of proximal end of right ulna with routine healing, unspecified fracture morphology, subsequent encounter - Previous fracture of the right proximal  ulna on 12/15/2019 and underwent internal fixation by Dr. Grandville Silos on 01/13/2020.  Other medical conditions are listed as follows:  History of hypertension: Blood pressure was 120/82 today in the office.  History of iron deficiency anemia  Orders: No orders of the defined types were placed in this encounter.  No orders of the defined types were placed in this encounter.   Follow-Up Instructions: Return in about 5 months (around 09/28/2022) for Psoriatic arthritis, Osteoarthritis.   Ofilia Neas, PA-C  Note - This record has been created using Dragon software.  Chart creation errors have been sought, but may not always  have been located. Such creation errors do not reflect on  the standard of medical care.

## 2022-04-17 ENCOUNTER — Other Ambulatory Visit (HOSPITAL_COMMUNITY): Payer: Self-pay

## 2022-04-21 DIAGNOSIS — F4321 Adjustment disorder with depressed mood: Secondary | ICD-10-CM | POA: Diagnosis not present

## 2022-04-27 ENCOUNTER — Encounter: Payer: Self-pay | Admitting: Emergency Medicine

## 2022-04-27 ENCOUNTER — Telehealth: Payer: BC Managed Care – PPO | Admitting: Emergency Medicine

## 2022-04-27 DIAGNOSIS — R112 Nausea with vomiting, unspecified: Secondary | ICD-10-CM

## 2022-04-27 DIAGNOSIS — R197 Diarrhea, unspecified: Secondary | ICD-10-CM | POA: Diagnosis not present

## 2022-04-27 MED ORDER — ONDANSETRON HCL 4 MG PO TABS: 4.0000 mg | ORAL_TABLET | Freq: Three times a day (TID) | ORAL | 0 refills | Status: DC | PRN

## 2022-04-27 NOTE — Progress Notes (Signed)
We are sorry that you are not feeling well.  Here is how we plan to help!  Based on what you have shared with me it looks like you have Acute Infectious Diarrhea.  Most cases of acute diarrhea are due to infections with virus and bacteria and are self-limited conditions lasting less than 14 days.  For your symptoms you may take Imodium 2 mg tablets that are over the counter at your local pharmacy. Take two tablet now and then one after each loose stool up to 6 a day.  Antibiotics are not needed for most people with diarrhea.  Zofran 4 mg 1 tablet every 8 hours as needed for nausea and vomiting  HOME CARE We recommend changing your diet to help with your symptoms for the next few days. Drink plenty of fluids that contain water salt and sugar. Sports drinks such as Gatorade may help.  You may try broths, soups, bananas, applesauce, soft breads, mashed potatoes or crackers.  You are considered infectious for as long as the diarrhea continues. Hand washing or use of alcohol based hand sanitizers is recommend. It is best to stay out of work or school until your symptoms stop.   GET HELP RIGHT AWAY If you have dark yellow colored urine or do not pass urine frequently you should drink more fluids.   If your symptoms worsen  If you feel like you are going to pass out (faint) You have a new problem  MAKE SURE YOU  Understand these instructions. Will watch your condition. Will get help right away if you are not doing well or get worse.  Thank you for choosing an e-visit.  Your e-visit answers were reviewed by a board certified advanced clinical practitioner to complete your personal care plan. Depending upon the condition, your plan could have included both over the counter or prescription medications.  Please review your pharmacy choice. Make sure the pharmacy is open so you can pick up prescription now. If there is a problem, you may contact your provider through [employer] and have  the prescription routed to another pharmacy.  Your safety is important to [place]. If you have drug allergies check your prescription carefully.   For the next 24 hours you can use MyChart to ask questions about today's visit, request a non-urgent call back, or ask for a work or school excuse. You will get an email in the next two days asking about your experience. I hope that your e-visit has been valuable and will speed your recovery.

## 2022-04-27 NOTE — Progress Notes (Signed)
I have spent 5 minutes in review of e-visit questionnaire, review and updating patient chart, medical decision making and response to patient.   Sye Schroepfer, PA-C    

## 2022-04-28 ENCOUNTER — Encounter: Payer: Self-pay | Admitting: Physician Assistant

## 2022-04-28 ENCOUNTER — Ambulatory Visit: Payer: BC Managed Care – PPO | Admitting: Surgery

## 2022-04-28 ENCOUNTER — Ambulatory Visit (INDEPENDENT_AMBULATORY_CARE_PROVIDER_SITE_OTHER): Payer: BC Managed Care – PPO | Admitting: Physician Assistant

## 2022-04-28 VITALS — BP 120/82 | HR 90 | Resp 16 | Ht 70.0 in | Wt 304.0 lb

## 2022-04-28 DIAGNOSIS — M2241 Chondromalacia patellae, right knee: Secondary | ICD-10-CM

## 2022-04-28 DIAGNOSIS — Z79899 Other long term (current) drug therapy: Secondary | ICD-10-CM

## 2022-04-28 DIAGNOSIS — S52001D Unspecified fracture of upper end of right ulna, subsequent encounter for closed fracture with routine healing: Secondary | ICD-10-CM

## 2022-04-28 DIAGNOSIS — M19041 Primary osteoarthritis, right hand: Secondary | ICD-10-CM

## 2022-04-28 DIAGNOSIS — F4321 Adjustment disorder with depressed mood: Secondary | ICD-10-CM | POA: Diagnosis not present

## 2022-04-28 DIAGNOSIS — Z8679 Personal history of other diseases of the circulatory system: Secondary | ICD-10-CM

## 2022-04-28 DIAGNOSIS — M1712 Unilateral primary osteoarthritis, left knee: Secondary | ICD-10-CM

## 2022-04-28 DIAGNOSIS — L4050 Arthropathic psoriasis, unspecified: Secondary | ICD-10-CM | POA: Diagnosis not present

## 2022-04-28 DIAGNOSIS — M67911 Unspecified disorder of synovium and tendon, right shoulder: Secondary | ICD-10-CM

## 2022-04-28 DIAGNOSIS — L409 Psoriasis, unspecified: Secondary | ICD-10-CM

## 2022-04-28 DIAGNOSIS — Z87828 Personal history of other (healed) physical injury and trauma: Secondary | ICD-10-CM

## 2022-04-28 DIAGNOSIS — Z862 Personal history of diseases of the blood and blood-forming organs and certain disorders involving the immune mechanism: Secondary | ICD-10-CM

## 2022-04-28 DIAGNOSIS — I8393 Asymptomatic varicose veins of bilateral lower extremities: Secondary | ICD-10-CM | POA: Diagnosis not present

## 2022-04-28 DIAGNOSIS — M19042 Primary osteoarthritis, left hand: Secondary | ICD-10-CM

## 2022-04-28 NOTE — Patient Instructions (Signed)
Standing Labs We placed an order today for your standing lab work.   Please have your standing labs drawn in September and every 3 months   If possible, please have your labs drawn 2 weeks prior to your appointment so that the provider can discuss your results at your appointment.  Please note that you may see your imaging and lab results in MyChart before we have reviewed them. We may be awaiting multiple results to interpret others before contacting you. Please allow our office up to 72 hours to thoroughly review all of the results before contacting the office for clarification of your results.  We have open lab daily: Monday through Thursday from 1:30-4:30 PM and Friday from 1:30-4:00 PM at the office of Dr. Shaili Deveshwar, [facility].   Please be advised, all patients with office appointments requiring lab work will take precedent over walk-in lab work.  If possible, please come for your lab work on Monday and Friday afternoons, as you may experience shorter wait times. The office is located at [address], [address], [place], [place] [address] No appointment is necessary.   Labs are drawn by [facility]. Please bring your co-pay at the time of your lab draw.  You may receive a bill from [facility] for your lab work.  Please note if you are on Hydroxychloroquine and and an order has been placed for a Hydroxychloroquine level, you will need to have it drawn 4 hours or more after your last dose.  If you wish to have your labs drawn at another location, please call the office 24 hours in advance to send orders.  If you have any questions regarding directions or hours of operation,  please call [phone].   As a reminder, please drink plenty of water prior to coming for your lab work. Thanks!  

## 2022-05-05 DIAGNOSIS — F4321 Adjustment disorder with depressed mood: Secondary | ICD-10-CM | POA: Diagnosis not present

## 2022-05-12 DIAGNOSIS — F4321 Adjustment disorder with depressed mood: Secondary | ICD-10-CM | POA: Diagnosis not present

## 2022-05-20 DIAGNOSIS — F4321 Adjustment disorder with depressed mood: Secondary | ICD-10-CM | POA: Diagnosis not present

## 2022-05-26 DIAGNOSIS — F4321 Adjustment disorder with depressed mood: Secondary | ICD-10-CM | POA: Diagnosis not present

## 2022-06-09 DIAGNOSIS — F4321 Adjustment disorder with depressed mood: Secondary | ICD-10-CM | POA: Diagnosis not present

## 2022-06-30 ENCOUNTER — Other Ambulatory Visit (HOSPITAL_COMMUNITY): Payer: Self-pay

## 2022-06-30 ENCOUNTER — Other Ambulatory Visit: Payer: Self-pay | Admitting: Physician Assistant

## 2022-06-30 DIAGNOSIS — L409 Psoriasis, unspecified: Secondary | ICD-10-CM

## 2022-06-30 DIAGNOSIS — L4050 Arthropathic psoriasis, unspecified: Secondary | ICD-10-CM

## 2022-06-30 MED ORDER — TALTZ 80 MG/ML ~~LOC~~ SOAJ
80.0000 mg | SUBCUTANEOUS | 0 refills | Status: DC
  Filled 2022-06-30: qty 3, 84d supply, fill #0

## 2022-06-30 NOTE — Telephone Encounter (Signed)
Next Visit: 10/24/2022  Last Visit: 04/28/2022  Last Fill: 04/15/2022  DX: Psoriatic arthritis   Current Dose per office note 04/28/2022: Taltz 80 mg sq injections every 28 days  Labs: 04/14/2022 CBC and CMP are stable.  TB Gold: 11/14/2021 Neg    Okay to refill Taltz?

## 2022-07-02 ENCOUNTER — Other Ambulatory Visit (HOSPITAL_COMMUNITY): Payer: Self-pay

## 2022-08-04 ENCOUNTER — Ambulatory Visit: Payer: BC Managed Care – PPO | Admitting: Surgery

## 2022-08-04 ENCOUNTER — Ambulatory Visit (INDEPENDENT_AMBULATORY_CARE_PROVIDER_SITE_OTHER): Payer: BC Managed Care – PPO | Admitting: Surgery

## 2022-08-04 ENCOUNTER — Encounter: Payer: Self-pay | Admitting: Surgery

## 2022-08-04 VITALS — BP 126/83 | HR 73 | Temp 98.2°F | Resp 20 | Ht 70.0 in | Wt 311.8 lb

## 2022-08-04 DIAGNOSIS — I83893 Varicose veins of bilateral lower extremities with other complications: Secondary | ICD-10-CM | POA: Diagnosis not present

## 2022-08-04 NOTE — Progress Notes (Signed)
[facility] [place]  Patient name: Joseph [name] MRN: [id number] DOB: 31-Aug-1981 Sex: male   REASON FOR VISIT:    Follow up  HISOTRY OF PRESENT ILLNESS:    Joseph [name] is a 41 y.o. male that I saw in March 2023 for evaluation of leg swelling and varicose veins that had progressively gotten worse over the past 6 months.  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  He does take Tylenol to alleviate the pain.  He also tries to keep his legs elevated.  He denies any history of DVT.  Patient has a history of psoriatic arthritis and psoriasis.  He has a known right sided aortic arch he is a non-smoker. 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 Antoine Poche 02/2009 with echo, as needed f/u   Torn meniscus    right knee     FAMILY HISTORY:   Family History  Problem Relation Age of Onset   Pulmonary fibrosis Mother    Hypertension Mother    Pancreatic cancer Maternal Grandmother    Rheum arthritis Paternal Aunt    Cancer Paternal Grandmother    Breast cancer Paternal Grandmother    COPD Paternal Grandfather    CAD Paternal Grandfather    Colon cancer Neg Hx    Prostate cancer Neg Hx    Diabetes Neg Hx    Thyroid disease Neg Hx    Liver cancer Neg Hx    Stomach cancer Neg Hx    Rectal cancer Neg Hx     SOCIAL HISTORY:   Social History   Tobacco Use   Smoking status: Never    Smokeless tobacco: Never  Substance Use Topics   Alcohol use: No     ALLERGIES:   Allergies  Allergen Reactions   Sulfonamide Derivatives Other (See Comments)    UNSPECIFIED   Bee Venom Palpitations    unknown     CURRENT MEDICATIONS:   Current Outpatient Medications  Medication Sig Dispense Refill   clobetasol cream (TEMOVATE) 0.05 % Apply topically 2 (two) times daily as needed. 30 g 1   enalapril (VASOTEC) 5 MG tablet Take 1 tablet (5 mg total) by mouth daily. 90 tablet 3   Ixekizumab (TALTZ) 80 MG/ML SOAJ Inject 80 mg into the skin every 28 (twenty-eight) days. 3 mL 0   No current facility-administered medications for this visit.    REVIEW OF SYSTEMS:   [X]  denotes positive finding, [ ]  denotes negative finding Cardiac  Comments:  Chest pain or chest pressure:    Shortness of breath upon exertion:    Short of breath when lying flat:    Irregular heart rhythm:        Vascular    Pain in calf, thigh, or hip brought on by ambulation:    Pain in feet at night that wakes you up from your sleep:  Blood clot in your veins:    Leg swelling:  x       Pulmonary    Oxygen at home:    Productive cough:     Wheezing:         Neurologic    Sudden weakness in arms or legs:     Sudden numbness in arms or legs:     Sudden onset of difficulty speaking or slurred speech:    Temporary loss of vision in one eye:     Problems with dizziness:         Gastrointestinal    Blood in stool:     Vomited blood:         Genitourinary    Burning when urinating:     Blood in urine:        Psychiatric    Major depression:         Hematologic    Bleeding problems:    Problems with blood clotting too easily:        Skin    Rashes or ulcers:        Constitutional    Fever or chills:      PHYSICAL EXAM:   There were no vitals filed for this visit.  GENERAL: The patient is a well-nourished male, in no acute distress. The vital signs are documented above. CARDIAC:  There is a regular rate and rhythm.  VASCULAR: SonoSite was used to evaluate the saphenous vein which is very dilated and straight with multiple branches.  He has multiple ropelike varicosities in the right leg PULMONARY: Non-labored respirations ABDOMEN: Soft and non-tender with normal pitched bowel sounds.  MUSCULOSKELETAL: There are no major deformities or cyanosis. NEUROLOGIC: No focal weakness or paresthesias are detected. SKIN: There are no ulcers or rashes noted. PSYCHIATRIC: The patient has a normal affect.  STUDIES:   Venous reflux studies: Venous Reflux Times  +----------------+---------+------+-----------+------------+--------+  RIGHT           Reflux NoRefluxReflux TimeDiameter cmsComments                            Yes                                   +----------------+---------+------+-----------+------------+--------+  CFV                       yes   >1 second                       +----------------+---------+------+-----------+------------+--------+  FV prox         no                                              +----------------+---------+------+-----------+------------+--------+  FV mid          no                                              +----------------+---------+------+-----------+------------+--------+  FV dist         no                                              +----------------+---------+------+-----------+------------+--------+  Popliteal       no                                              +----------------+---------+------+-----------+------------+--------+  GSV at [facility]                yes    >500 ms     0.925              +----------------+---------+------+-----------+------------+--------+  GSV prox thigh  no                           0.609              +----------------+---------+------+-----------+------------+--------+  GSV mid thigh   no                           0.452               +----------------+---------+------+-----------+------------+--------+  GSV dist thigh  no                           0.459              +----------------+---------+------+-----------+------------+--------+  GSV at knee               yes    >500 ms     0.781              +----------------+---------+------+-----------+------------+--------+  GSV prox calf   no                           0.377              +----------------+---------+------+-----------+------------+--------+  SSV Pop Fossa   no                            0.34              +----------------+---------+------+-----------+------------+--------+  SSV prox calf   no                           0.323              +----------------+---------+------+-----------+------------+--------+  SSV mid calf    no                           0.403              +----------------+---------+------+-----------+------------+--------+  AASV at [facility]               yes    >500 ms      1.87              +----------------+---------+------+-----------+------------+--------+  VV mid post calf          yes    >500 ms                        +----------------+---------+------+-----------+------------+--------+   MEDICAL ISSUES:   CEAP class 3: The patient has had some improvement from wearing compression socks.  He still has his legs become very swollen at the end of the day.  He has significant right saphenous vein reflux and so I have recommended laser ablation of the right saphenous vein.  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  He wishes to proceed.  We will work on Therapist, occupational and get him scheduled in the near future.    Charlena Cross, MD, FACS [facility] [phone] Pager [phone]

## 2022-08-06 ENCOUNTER — Other Ambulatory Visit: Payer: Self-pay | Admitting: *Deleted

## 2022-08-06 DIAGNOSIS — I83891 Varicose veins of right lower extremities with other complications: Secondary | ICD-10-CM

## 2022-09-04 ENCOUNTER — Telehealth: Payer: Self-pay | Admitting: *Deleted

## 2022-09-04 ENCOUNTER — Encounter: Payer: Self-pay | Admitting: Medical

## 2022-09-04 MED ORDER — ENALAPRIL MALEATE 5 MG PO TABS: 5.0000 mg | ORAL_TABLET | Freq: Every day | ORAL | 1 refills | Status: DC

## 2022-09-04 NOTE — Telephone Encounter (Signed)
Left detailed telephone voice message instructing [name] to hold/stop Taltz injection prior to office surgery scheduled on 10-16-2022 per Dr. Stephens Shire recommendation.

## 2022-09-09 ENCOUNTER — Other Ambulatory Visit: Payer: Self-pay | Admitting: *Deleted

## 2022-09-09 DIAGNOSIS — Z79899 Other long term (current) drug therapy: Secondary | ICD-10-CM

## 2022-09-10 LAB — COMPLETE METABOLIC PANEL WITH GFR
AG Ratio: 0.9 (calc) — ABNORMAL LOW (ref 1.0–2.5)
ALT: 13 U/L (ref 9–46)
AST: 14 U/L (ref 10–40)
Albumin: 3.7 g/dL (ref 3.6–5.1)
Alkaline phosphatase (APISO): 92 U/L (ref 36–130)
BUN: 11 mg/dL (ref 7–25)
CO2: 25 mmol/L (ref 20–32)
Calcium: 8.5 mg/dL — ABNORMAL LOW (ref 8.6–10.3)
Chloride: 102 mmol/L (ref 98–110)
Creat: 1.02 mg/dL (ref 0.60–1.26)
Globulin: 4 g/dL (calc) — ABNORMAL HIGH (ref 1.9–3.7)
Glucose, Bld: 120 mg/dL — ABNORMAL HIGH (ref 65–99)
Potassium: 4 mmol/L (ref 3.5–5.3)
Sodium: 138 mmol/L (ref 135–146)
Total Bilirubin: 0.6 mg/dL (ref 0.2–1.2)
Total Protein: 7.7 g/dL (ref 6.1–8.1)
eGFR: 96 mL/min/{1.73_m2} (ref >=60)

## 2022-09-10 LAB — CBC WITH DIFFERENTIAL/PLATELET
Absolute Monocytes: 524 cells/uL (ref 200–950)
Basophils Absolute: 64 cells/uL (ref 0–200)
Basophils Relative: 0.7 %
Eosinophils Absolute: 230 cells/uL (ref 15–500)
Eosinophils Relative: 2.5 %
HCT: 45.5 % (ref 38.5–50.0)
Hemoglobin: 14.9 g/dL (ref 13.2–17.1)
Lymphs Abs: 1886 cells/uL (ref 850–3900)
MCH: 26.9 pg — ABNORMAL LOW (ref 27.0–33.0)
MCHC: 32.7 g/dL (ref 32.0–36.0)
MCV: 82.1 fL (ref 80.0–100.0)
MPV: 11.5 fL (ref 7.5–12.5)
Monocytes Relative: 5.7 %
Neutro Abs: 6495 cells/uL (ref 1500–7800)
Neutrophils Relative %: 70.6 %
Platelets: 218 10*3/uL (ref 140–400)
RBC: 5.54 10*6/uL (ref 4.20–5.80)
RDW: 14.4 % (ref 11.0–15.0)
Total Lymphocyte: 20.5 %
WBC: 9.2 10*3/uL (ref 3.8–10.8)

## 2022-09-10 NOTE — Progress Notes (Signed)
CBC and CMP stable.

## 2022-09-19 ENCOUNTER — Other Ambulatory Visit (HOSPITAL_COMMUNITY): Payer: Self-pay

## 2022-09-25 ENCOUNTER — Other Ambulatory Visit (HOSPITAL_COMMUNITY): Payer: Self-pay

## 2022-09-25 ENCOUNTER — Other Ambulatory Visit: Payer: Self-pay | Admitting: Physician Assistant

## 2022-09-25 DIAGNOSIS — L4050 Arthropathic psoriasis, unspecified: Secondary | ICD-10-CM

## 2022-09-25 DIAGNOSIS — L409 Psoriasis, unspecified: Secondary | ICD-10-CM

## 2022-09-25 MED ORDER — TALTZ 80 MG/ML ~~LOC~~ SOAJ
80.0000 mg | SUBCUTANEOUS | 0 refills | Status: DC
  Filled 2022-09-25: qty 3, 84d supply, fill #0

## 2022-09-25 NOTE — Telephone Encounter (Signed)
Next Visit: 10/27/2022   Last Visit: 04/28/2022   Last Fill: 06/30/2022   DX: Psoriatic arthritis    Current Dose per office note 04/28/2022: Taltz 80 mg sq injections every 28 days   Labs: 09/09/2022 CBC and CMP are stable.   TB Gold: 11/14/2021 Neg     Okay to refill Taltz?

## 2022-09-26 ENCOUNTER — Other Ambulatory Visit: Payer: Self-pay

## 2022-09-26 ENCOUNTER — Other Ambulatory Visit (HOSPITAL_BASED_OUTPATIENT_CLINIC_OR_DEPARTMENT_OTHER): Payer: Self-pay

## 2022-09-26 ENCOUNTER — Other Ambulatory Visit (HOSPITAL_COMMUNITY): Payer: Self-pay

## 2022-10-06 ENCOUNTER — Other Ambulatory Visit (HOSPITAL_COMMUNITY): Payer: Self-pay

## 2022-10-09 ENCOUNTER — Other Ambulatory Visit: Payer: Self-pay | Admitting: *Deleted

## 2022-10-09 MED ORDER — LORAZEPAM 1 MG PO TABS: ORAL_TABLET | ORAL | 0 refills | Status: DC

## 2022-10-14 NOTE — Progress Notes (Unsigned)
Office Visit Note  Patient: Joseph [name]             Date of Birth: [birth year]           MRN: [id number]             PCP: Elise Benne Referring: Elise Benne Visit Date: 10/27/2022 Occupation: _0 @  Subjective:  Medication monitoring   History of Present Illness: Joseph [name] is a 41 y.o. male with history of psoriatic arthritis and osteoarthritis.  [name] currently on Taltz 80 mg sq injections every 28 days.  [name] tolerating Taltz without any side effects or injection site reactions.  He has not missed any doses recently.  He denies any signs or symptoms of a psoriatic arthritis flare recently.  He has not had any joint swelling.  He denies any Achilles tendinitis or plantar fasciitis.  He denies any SI joint discomfort.  He denies any active psoriasis at this time. Patient reports that he underwent laser ablation of the right great saphenous vein by Dr. Trula Slade on 10/16/22.  He followed up with Dr. Scot Dock on 10/23/2022 and there was no signs of complications.   He has not had any recent or recurrent infections.   Activities of Daily Living:  Patient reports morning stiffness for 0 minutes.   Patient Denies nocturnal pain.  Difficulty dressing/grooming: Denies Difficulty climbing stairs: Denies Difficulty getting out of chair: Denies Difficulty using hands for taps, buttons, cutlery, and/or writing: Denies  Review of Systems  Constitutional:  Negative for fatigue.  HENT:  Negative for mouth sores and mouth dryness.   Eyes:  Negative for dryness.  Respiratory:  Negative for shortness of breath.   Cardiovascular:  Negative for chest pain and palpitations.  Gastrointestinal:  Negative for blood in stool, constipation and diarrhea.  Endocrine: Negative for increased urination.  Genitourinary:  Negative for involuntary urination.  Musculoskeletal:  Negative for joint pain, gait problem, joint pain, joint swelling, myalgias, muscle weakness, morning  stiffness, muscle tenderness and myalgias.  Skin:  Negative for color change, rash and sensitivity to sunlight.  Allergic/Immunologic: Negative for susceptible to infections.  Neurological:  Positive for numbness. Negative for dizziness and headaches.  Hematological:  Negative for swollen glands.  Psychiatric/Behavioral:  Positive for sleep disturbance. Negative for depressed mood. The patient is not nervous/anxious.     PMFS History:  Patient Active Problem List   Diagnosis Date Noted   Acute medial meniscus tear of right knee    Elbow dislocation, right, initial encounter 03/06/2020   Closed disp fx of right olecranon with intraartic exten w malunion 02/14/2020   Morbid obesity with BMI of 45.0-49.9, adult ([place]) 11/25/2019   Hair loss 12/31/2017   Varicose veins of both lower extremities 07/26/2017   Iron deficiency anemia 09/21/2014   Psoriatic arthritis ([place]) 09/21/2014   ESSENTIAL HYPERTENSION, MALIGNANT 02/18/2009   Nonspecific (abnormal) findings on radiological and other examination of body structure 02/16/2009   CHEST XRAY, ABNORMAL 02/16/2009    Past Medical History:  Diagnosis Date   Acute sinus infection 11/07/2014   Arthritis    Blood transfusion without reported diagnosis    At [facility]    Difficult intubation 01/13/2020   difficult airway in OR (per Dr Laurey Arrow)   Iron deficiency anemia 09/21/2014   Psoriasis    Psoriatic arthritis ([place])    Psoriatic arthritis ([place])    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 Percival Spanish 02/2009 with echo, as needed f/u   Torn meniscus    right knee    Family History  Problem Relation Age of Onset   Pulmonary fibrosis Mother    Hypertension Mother    Pancreatic cancer Maternal Grandmother    Rheum arthritis Paternal Aunt    Cancer Paternal Grandmother    Breast cancer Paternal Grandmother    COPD Paternal Grandfather     CAD Paternal Grandfather    Colon cancer Neg Hx    Prostate cancer Neg Hx    Diabetes Neg Hx    Thyroid disease Neg Hx    Liver cancer Neg Hx    Stomach cancer Neg Hx    Rectal cancer Neg Hx    Past Surgical History:  Procedure Laterality Date   COLONOSCOPY WITH ESOPHAGOGASTRODUODENOSCOPY (EGD)     ENDOVENOUS ABLATION SAPHENOUS VEIN W/ LASER  10/16/2022   endovenous laser ablation right greater saphenous vein by Harold Barban MD   EXTERNAL FIXATION REMOVAL Right 03/22/2020   Procedure: REMOVAL EXTERNAL FIXATION ARM;  Surgeon: Shona Needles, MD;  Location: [place];  Service: Orthopedics;  Laterality: Right;   HERNIA REPAIR  1994   umbicial   INNER EAR SURGERY Left    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, 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   KNEE ARTHROSCOPY Right 05/10/2021   Procedure: ARTHROSCOPY KNEE partial medial meniscectomy;  Surgeon: Meredith Pel, MD;  Location: [place];  Service: Orthopedics;  Laterality: Right;   LIGAMENT REPAIR Right 01/13/2020   Procedure: COLLATERAL LIGAMENT REPAIRS;  Surgeon: Milly Jakob, MD;  Location: [place];  Service: Orthopedics;  Laterality: Right;   ORIF ELBOW FRACTURE Right 02/17/2020   Procedure: REVISION OPEN REDUCTION INTERNAL FIXATION (ORIF) ELBOW/OLECRANON FRACTURE;  Surgeon: Shona Needles, MD;  Location: [place];  Service: Orthopedics;  Laterality: Right;   ORIF ULNAR FRACTURE Right 01/13/2020   Procedure: OPEN TREATMENT OF COMPLEX RIGHT PROXIMAL ULNA FRACTURE;  Surgeon: Milly Jakob, MD;  Location: [place];  Service: Orthopedics;  Laterality: Right;  PROCEDURE: OPEN TREATMENT OF COMPLEX RIGHT PROXIMAL ULNA FRACTURE WITH POSSIBLE COLLATERAL LIGAMENT REPAIRS LENGTH OF SURGERY: 2.5 [place] + REGIONAL BLOCK   Social History   Social History Narrative   Right handed   Caffeien use:1 soda per day   Immunization History  Administered Date(s) Administered   Influenza,inj,Quad PF,6+ Mos  11/20/2021   MMR 02/12/2001   PFIZER Comirnaty(Gray Top)Covid-19 Tri-Sucrose Vaccine 09/21/2020, 10/12/2020   PNEUMOCOCCAL CONJUGATE-20 11/20/2021   Pneumococcal Polysaccharide-23 11/10/2012   Td 08/17/2001     Objective: Vital Signs: BP (!) 123/90 (BP Location: Left Arm, Patient Position: Sitting, Cuff Size: Large)   Pulse 98   Resp 17   Ht _0  (1.778 m)   Wt (!) 306 lb 6.4 oz (139 kg)   BMI 43.96 kg/m    Physical Exam Vitals and nursing note reviewed.  Constitutional:      Appearance: [name] well-developed.  HENT:     Head: Normocephalic and atraumatic.  Eyes:     Conjunctiva/sclera: Conjunctivae normal.     Pupils: Pupils are equal, round, and reactive to light.  Cardiovascular:     Rate and Rhythm: Normal rate and regular rhythm.     Heart sounds: Normal heart sounds.  Pulmonary:     Effort: Pulmonary effort is normal.     Breath sounds: Normal breath sounds.  Abdominal:     General: Bowel  sounds are normal.     Palpations: Abdomen is soft.  Musculoskeletal:     Cervical back: Normal range of motion and neck supple.  Skin:    General: Skin is warm and dry.     Capillary Refill: Capillary refill takes less than 2 seconds.  Neurological:     Mental Status: [name] alert and oriented to person, place, and time.  Psychiatric:        Behavior: Behavior normal.      Musculoskeletal Exam: C-spine, thoracic spine, lumbar spine have good range of motion.  No midline spinal tenderness or SI joint tenderness.  Shoulder joints have good range of motion with no discomfort.  Right elbow joint flexion contracture noted.  Limited extension of the right wrist.  No tenderness or synovitis over MCP or PIP joints.  Complete fist formation bilaterally.  Some PIP and DIP thickening consistent with osteoarthritis of both hands.  Hip joints have good range of motion with no groin pain.  Knee joints have good range of motion with no warmth or effusion.  Ankle joints have good range of  motion with no tenderness or joint swelling.  No evidence of Achilles tendinitis or plantar fasciitis.  CDAI Exam: CDAI Score: -- Patient Global: --; Provider Global: -- Swollen: --; Tender: -- Joint Exam 10/27/2022   No joint exam has been documented for this visit   There is currently no information documented on the homunculus. Go to the Rheumatology activity and complete the homunculus joint exam.  Investigation: No additional findings.  Imaging: VAS [place] LOWER EXTREMITY VENOUS POST ABLATION  Result Date: 10/23/2022  Lower Venous Reflux Study Patient Name:  Joseph [name]  Date of Exam:   10/23/2022 Medical Rec #: [id number]        Accession #:    [id number] Date of Birth: [birth year]        Patient Gender: M Patient Age:   65 years Exam [facility] Procedure:      VAS [place] LOWER EXTREMITY VENOUS POST ABLATION Referring Phys: Harold Barban --------------------------------------------------------------------------------  Other Indications: Right Greater Saphenous vein ablation 10/16/2022. Performing Technologist: Alvia Grove RVT  Examination Guidelines: A complete evaluation includes B-mode imaging, spectral Doppler, color Doppler, and power Doppler as needed of all accessible portions of each vessel. Bilateral testing is considered an integral part of a complete examination. Limited examinations for reoccurring indications may be performed as noted. The reflux portion of the exam is performed with the patient in reverse Trendelenburg. Significant venous reflux is defined as >500 ms in the superficial venous system, and >1 second in the deep venous system.   Summary: Right: - No evidence of deep vein thrombosis seen in the right lower extremity, from the common femoral through the popliteal veins. The right Greater Saphenous vein is closed from the medial knee to the sapheno-femoral junction.  *See table(s) above for measurements and observations. Electronically signed by  Deitra Mayo MD on 10/23/2022 at 11:16:29 AM.    Final     Recent Labs: Lab Results  Component Value Date   WBC 9.2 09/09/2022   HGB 14.9 09/09/2022   PLT 218 09/09/2022   NA 138 09/09/2022   K 4.0 09/09/2022   CL 102 09/09/2022   CO2 25 09/09/2022   GLUCOSE 120 (H) 09/09/2022   BUN 11 09/09/2022   CREATININE 1.02 09/09/2022   BILITOT 0.6 09/09/2022   ALKPHOS 128 (H) 04/14/2022   AST 14 09/09/2022   ALT 13 09/09/2022  PROT 7.7 09/09/2022   ALBUMIN 4.2 04/14/2022   CALCIUM 8.5 (L) 09/09/2022   GFRAA 112 04/19/2021   QFTBGOLD Negative 04/03/2017   QFTBGOLDPLUS NEGATIVE 11/14/2021    Speciality Comments: Prior therapy: Enbrel/Humira (inadequate response)  and Remicaide (d/c due to cost and time consuming infusion), Cosentyx stopped 04/19/21. Talt started 05/27/21  FU q 3 months  Procedures:  No procedures performed Allergies: Sulfonamide derivatives and Bee venom   Assessment / Plan:     Visit Diagnoses: Psoriatic arthritis ([place]): He has no synovitis or dactylitis on examination today.  No evidence of Achilles tendinitis or plantar fasciitis.  He has not been experiencing any nocturnal pain or morning stiffness.  No difficulty with ADLs.  He has no SI joint tenderness upon palpation.  No active psoriasis currently.  He has clinically been doing well on Taltz 80 mg subcutaneous injections every 28 days.  He continues to tolerate Taltz without any side effects or injection site reactions.  He has not had any recent or recurrent infections.  He will remain on Taltz as monotherapy.  He was advised to notify us if he develops signs or symptoms of a flare.  He will follow-up in the office in 5 months or sooner if needed.  Psoriasis: He has no active psoriasis at this time.   High risk medication use - Taltz 80 mg sq injections every 28 days. Previously had inadequate response to Enbrel, Humira, cosentyx and Remicade. CBC and CMP updated on 09/09/22. His next lab work will be due  in January and every 3 months.  TB gold negative on 11/14/21.  Future order placed today.  Discussed the importance of holding taltz if he develops signs or symptoms of an infection and to resume once the infection has completely cleared.   - Plan: QuantiFERON-TB Gold Plus, CBC with Differential/Platelet, COMPLETE METABOLIC PANEL WITH GFR  Screening for tuberculosis - Future order for TB gold placed today. Plan: QuantiFERON-TB Gold Plus  Varicose veins of both lower extremities, unspecified whether complicated: He underwent laser ablation of the right great saphenous vein by Dr. Trula Slade on 10/16/22.  No complications.   Primary osteoarthritis of both hands: PIP and DIP thickening but no tenderness or synovitis noted. Complete fist formation bilaterally.  Discussed the importance of joint protection and muscle strengthening.   Primary osteoarthritis of left knee: He has good ROM of the left knee with no warmth or effusion.   Tendinopathy of right shoulder: He has good ROM of the right shoulder with no discomfort currently.   History of torn meniscus of right knee - MRI on 04/06/2021.  Chondromalacia patellae of right knee: He has good ROM of the right knee joint with no warmth or effusion.    Other medical conditions are listed as follows:   Closed fracture of proximal end of right ulna with routine healing, unspecified fracture morphology, subsequent encounter - Previous fracture of the right proximal ulna on 12/15/2019 and underwent internal fixation by Dr. Grandville Silos on 01/13/2020. Flexion contracture of right elbow is unchanged.   History of hypertension: BP was 123/90 today in the office.   History of iron deficiency anemia    Orders: Orders Placed This Encounter  Procedures   QuantiFERON-TB Gold Plus   CBC with Differential/Platelet   COMPLETE METABOLIC PANEL WITH GFR   No orders of the defined types were placed in this encounter.    Follow-Up Instructions: Return in about 5 months  (around 03/28/2023) for Psoriatic arthritis, Osteoarthritis.   Geni Bers  Quita Skye, PA-C  Note - This record has been created using [employer].  Chart creation errors have been sought, but may not always  have been located. Such creation errors do not reflect on  the standard of medical care.

## 2022-10-16 ENCOUNTER — Ambulatory Visit (INDEPENDENT_AMBULATORY_CARE_PROVIDER_SITE_OTHER): Payer: BC Managed Care – PPO | Admitting: Surgery

## 2022-10-16 ENCOUNTER — Encounter: Payer: Self-pay | Admitting: *Deleted

## 2022-10-16 ENCOUNTER — Encounter: Payer: Self-pay | Admitting: Surgery

## 2022-10-16 VITALS — BP 130/86 | HR 96 | Temp 98.2°F | Resp 18 | Ht 70.0 in | Wt 313.0 lb

## 2022-10-16 DIAGNOSIS — I83893 Varicose veins of bilateral lower extremities with other complications: Secondary | ICD-10-CM

## 2022-10-16 HISTORY — PX: ENDOVENOUS ABLATION SAPHENOUS VEIN W/ LASER: SUR449

## 2022-10-16 NOTE — Progress Notes (Signed)
     Laser Ablation Procedure    Date: 10/16/2022   Joseph [name] DOB:03/08/1981  Consent signed: Yes      Surgeon: Coral Else MD   Procedure: Laser Ablation: right Greater Saphenous Vein  BP 130/86 (BP Location: Right Arm, Patient Position: Sitting, Cuff Size: Large)   Pulse 96   Temp 98.2 F (36.8 C) (Temporal)   Resp 18   Ht 5\' 10"  (1.778 m)   Wt (!) 313 lb (142 kg)   SpO2 97%   BMI 44.91 kg/m   Tumescent Anesthesia: 550 cc 0.9% NaCl with 50 cc Lidocaine HCL 1%  and 15 cc 8.4% NaHCO3  Local Anesthesia: 5 cc Lidocaine HCL and NaHCO3 (ratio 2:1)  7 watts continuous mode     Total energy: 1866 Joules    Total time: 266 seconds Treatment Length 37 cm   Laser Fiber Ref. #      Lot #  [id number]     Patient tolerated procedure well  Notes: All staff members wore facial masks.  [name] took Ativan 1 mg (2 tablets) on 10-16-2022 at 6:45 AM.    Description of Procedure:  After marking the course of the secondary varicosities, the patient was placed on the operating table in the supine position, and the right leg was prepped and draped in sterile fashion.   Local anesthetic was administered and under ultrasound guidance the saphenous vein was accessed with a micro needle and guide wire; then the mirco puncture sheath was placed.  A guide wire was inserted saphenofemoral junction , followed by a 5 french sheath.  The position of the sheath and then the laser fiber below the junction was confirmed using the ultrasound.  Tumescent anesthesia was administered along the course of the saphenous vein using ultrasound guidance. The patient was placed in Trendelenburg position and protective laser glasses were placed on patient and staff, and the laser was fired at 7 watts continuous mode for a total of 1866 joules.       Steri strip was applied to IV insertion site  and ABD pads and thigh high compression stockings were applied.  Ace wrap bandages were applied over the  right thigh and at the top of the saphenopopliteal junction. Blood loss was less than 15 cc.  Discharge instructions reviewed with patient and hardcopy of discharge instructions given to patient to take home. The patient ambulated out of the operating room having tolerated the procedure well.

## 2022-10-23 ENCOUNTER — Encounter: Payer: Self-pay | Admitting: Vascular Surgery

## 2022-10-23 ENCOUNTER — Ambulatory Visit (HOSPITAL_COMMUNITY)
Admission: RE | Admit: 2022-10-23 | Discharge: 2022-10-23 | Disposition: A | Discharge status: Home / Self Care | Payer: BC Managed Care – PPO | Source: Ambulatory Visit | Attending: Vascular Surgery | Admitting: Vascular Surgery

## 2022-10-23 ENCOUNTER — Ambulatory Visit (INDEPENDENT_AMBULATORY_CARE_PROVIDER_SITE_OTHER): Payer: BC Managed Care – PPO | Admitting: Vascular Surgery

## 2022-10-23 VITALS — BP 115/80 | HR 79 | Temp 98.0°F | Resp 20 | Ht 70.0 in | Wt 310.0 lb

## 2022-10-23 DIAGNOSIS — I83891 Varicose veins of right lower extremities with other complications: Secondary | ICD-10-CM

## 2022-10-23 DIAGNOSIS — I83893 Varicose veins of bilateral lower extremities with other complications: Secondary | ICD-10-CM

## 2022-10-23 NOTE — Progress Notes (Signed)
   Patient name: Joseph [name] MRN: [id number] DOB: 02-Mar-1981 Sex: male  REASON FOR VISIT: Follow-up after laser ablation of the right great saphenous vein by Dr. Myra Gianotti  HPI: Joseph [name] is a 41 y.o. male who had presented with venous insufficiency.  He had failed conservative treatment and was felt to be a good candidate for laser ablation of the right great saphenous vein.  This was done on 10/16/2022.  He comes in for a 1 week follow-up visit.  Patient is doing well with no specific complaints today.  He denies chest pain or shortness of breath.  Current Outpatient Medications  Medication Sig Dispense Refill   clobetasol cream (TEMOVATE) 0.05 % Apply topically 2 (two) times daily as needed. 30 g 1   enalapril (VASOTEC) 5 MG tablet Take 1 tablet (5 mg total) by mouth daily. 90 tablet 1   LORazepam (ATIVAN) 1 MG tablet Take 2 tablets 30 minutes prior to leaving house on day of office surgery.  Bring third tablet with you to office on day of office surgery. 3 tablet 0   Ixekizumab (TALTZ) 80 MG/ML SOAJ Inject 80 mg into the skin every 28 (twenty-eight) days. (Patient not taking: Reported on 10/16/2022) 3 mL 0   No current facility-administered medications for this visit.   REVIEW OF SYSTEMS: Arly.[email or web address] ] denotes positive finding; [  ] denotes negative finding  CARDIOVASCULAR:  [ ]  chest pain   [ ]  dyspnea on exertion  [ ]  leg swelling  CONSTITUTIONAL:  [ ]  fever   [ ]  chills  PHYSICAL EXAM: Vitals:   10/23/22 1108  BP: 115/80  Pulse: 79  Resp: 20  Temp: 98 F (36.7 C)  SpO2: 94%  Weight: (!) 310 lb (140.6 kg)  Height: 5\' 10"  (1.778 m)   GENERAL: The patient is a well-nourished male, in no acute distress. The vital signs are documented above. CARDIOVASCULAR: There is a regular rate and rhythm. PULMONARY: There is good air exchange bilaterally without wheezing or rales. VASCULAR: He has minimal bruising on his medial right thigh. He has no significant leg  swelling.  DATA:  VENOUS DUPLEX: I have independently interpreted his venous duplex scan today.  This was of the right lower extremity.  There was no evidence of DVT.  The right great saphenous vein was successfully closed from the knee to within a few's millimeters of the saphenofemoral junction.  MEDICAL ISSUES:  S/P LASER ABLATION RIGHT GREAT SAPHENOUS VEIN: The patient is doing well status post laser ablation of the right great saphenous vein.  He has minimal bruising.  He has 1 more week with his thigh-high stocking.  I encouraged him to stay as active as possible.  He asked about getting in the water and I think it is fine at this point.  I also encouraged him to elevate his leg daily.  We will see him as needed.  [facility] [phone]

## 2022-10-24 ENCOUNTER — Ambulatory Visit: Payer: BC Managed Care – PPO | Admitting: Rheumatology

## 2022-10-27 ENCOUNTER — Encounter: Payer: Self-pay | Admitting: Physician Assistant

## 2022-10-27 ENCOUNTER — Ambulatory Visit: Payer: BC Managed Care – PPO | Attending: Rheumatology | Admitting: Physician Assistant

## 2022-10-27 VITALS — BP 123/90 | HR 98 | Resp 17 | Ht 70.0 in | Wt 306.4 lb

## 2022-10-27 DIAGNOSIS — Z79899 Other long term (current) drug therapy: Secondary | ICD-10-CM

## 2022-10-27 DIAGNOSIS — S52001D Unspecified fracture of upper end of right ulna, subsequent encounter for closed fracture with routine healing: Secondary | ICD-10-CM

## 2022-10-27 DIAGNOSIS — M2241 Chondromalacia patellae, right knee: Secondary | ICD-10-CM

## 2022-10-27 DIAGNOSIS — L4050 Arthropathic psoriasis, unspecified: Secondary | ICD-10-CM | POA: Diagnosis not present

## 2022-10-27 DIAGNOSIS — M19041 Primary osteoarthritis, right hand: Secondary | ICD-10-CM

## 2022-10-27 DIAGNOSIS — Z87828 Personal history of other (healed) physical injury and trauma: Secondary | ICD-10-CM

## 2022-10-27 DIAGNOSIS — Z862 Personal history of diseases of the blood and blood-forming organs and certain disorders involving the immune mechanism: Secondary | ICD-10-CM

## 2022-10-27 DIAGNOSIS — L409 Psoriasis, unspecified: Secondary | ICD-10-CM | POA: Diagnosis not present

## 2022-10-27 DIAGNOSIS — M1712 Unilateral primary osteoarthritis, left knee: Secondary | ICD-10-CM

## 2022-10-27 DIAGNOSIS — I8393 Asymptomatic varicose veins of bilateral lower extremities: Secondary | ICD-10-CM | POA: Diagnosis not present

## 2022-10-27 DIAGNOSIS — M67911 Unspecified disorder of synovium and tendon, right shoulder: Secondary | ICD-10-CM

## 2022-10-27 DIAGNOSIS — M19042 Primary osteoarthritis, left hand: Secondary | ICD-10-CM

## 2022-10-27 DIAGNOSIS — Z111 Encounter for screening for respiratory tuberculosis: Secondary | ICD-10-CM

## 2022-10-27 DIAGNOSIS — Z8679 Personal history of other diseases of the circulatory system: Secondary | ICD-10-CM

## 2022-10-27 NOTE — Patient Instructions (Addendum)
Standing Labs We placed an order today for your standing lab work.   Please have your standing labs drawn at end of January and every 3 months   Please have your labs drawn 2 weeks prior to your appointment so that the provider can discuss your lab results at your appointment.  Please note that you may see your imaging and lab results in MyChart before we have reviewed them. We will contact you once all results are reviewed. Please allow our office up to 72 hours to thoroughly review all of the results before contacting the office for clarification of your results.  Lab hours are:   Monday through Thursday from 8:00 am -12:30 pm and 1:00 pm-5:00 pm and Friday from 8:00 am-12:00 pm.  Please be advised, all patients with office appointments requiring lab work will take precedent over walk-in lab work.   Labs are drawn by [facility]. Please bring your co-pay at the time of your lab draw.  You may receive a bill from [facility] for your lab work.  Please note if you are on Hydroxychloroquine and and an order has been placed for a Hydroxychloroquine level, you will need to have it drawn 4 hours or more after your last dose.  If you wish to have your labs drawn at another location, please call the office 24 hours in advance so we can fax the orders.  The office is located at [address], [address], , [place] [address] No appointment is necessary.    If you have any questions regarding directions or hours of operation,  please call [phone].   As a reminder, please drink plenty of water prior to coming for your lab work. Thanks!  

## 2022-12-23 ENCOUNTER — Other Ambulatory Visit: Payer: Self-pay | Admitting: *Deleted

## 2022-12-23 DIAGNOSIS — Z111 Encounter for screening for respiratory tuberculosis: Secondary | ICD-10-CM

## 2022-12-23 DIAGNOSIS — Z79899 Other long term (current) drug therapy: Secondary | ICD-10-CM

## 2022-12-24 NOTE — Progress Notes (Signed)
CBC WNL.  Glucose is 133.  Calcium is low-8.4.  globulin remains elevated but stable.  We will continue to monitor.    Increase dietary calcium intake.

## 2022-12-25 LAB — CBC WITH DIFFERENTIAL/PLATELET
Absolute Monocytes: 588 cells/uL (ref 200–950)
Basophils Absolute: 63 cells/uL (ref 0–200)
Basophils Relative: 0.9 %
Eosinophils Absolute: 280 cells/uL (ref 15–500)
Eosinophils Relative: 4 %
HCT: 44.3 % (ref 38.5–50.0)
Hemoglobin: 14.6 g/dL (ref 13.2–17.1)
Lymphs Abs: 2065 cells/uL (ref 850–3900)
MCH: 27 pg (ref 27.0–33.0)
MCHC: 33 g/dL (ref 32.0–36.0)
MCV: 82 fL (ref 80.0–100.0)
MPV: 11.5 fL (ref 7.5–12.5)
Monocytes Relative: 8.4 %
Neutro Abs: 4004 cells/uL (ref 1500–7800)
Neutrophils Relative %: 57.2 %
Platelets: 187 10*3/uL (ref 140–400)
RBC: 5.4 10*6/uL (ref 4.20–5.80)
RDW: 14.9 % (ref 11.0–15.0)
Total Lymphocyte: 29.5 %
WBC: 7 10*3/uL (ref 3.8–10.8)

## 2022-12-25 LAB — COMPLETE METABOLIC PANEL WITH GFR
AG Ratio: 0.9 (calc) — ABNORMAL LOW (ref 1.0–2.5)
ALT: 11 U/L (ref 9–46)
AST: 13 U/L (ref 10–40)
Albumin: 3.7 g/dL (ref 3.6–5.1)
Alkaline phosphatase (APISO): 86 U/L (ref 36–130)
BUN: 10 mg/dL (ref 7–25)
CO2: 26 mmol/L (ref 20–32)
Calcium: 8.4 mg/dL — ABNORMAL LOW (ref 8.6–10.3)
Chloride: 101 mmol/L (ref 98–110)
Creat: 0.95 mg/dL (ref 0.60–1.26)
Globulin: 4.1 g/dL (calc) — ABNORMAL HIGH (ref 1.9–3.7)
Glucose, Bld: 133 mg/dL — ABNORMAL HIGH (ref 65–99)
Potassium: 3.5 mmol/L (ref 3.5–5.3)
Sodium: 137 mmol/L (ref 135–146)
Total Bilirubin: 0.5 mg/dL (ref 0.2–1.2)
Total Protein: 7.8 g/dL (ref 6.1–8.1)
eGFR: 104 mL/min/{1.73_m2} (ref >=60)

## 2022-12-25 LAB — QUANTIFERON-TB GOLD PLUS
Mitogen-NIL: 8.39 IU/mL
NIL: 0.03 IU/mL
QuantiFERON-TB Gold Plus: NEGATIVE
TB1-NIL: 0 IU/mL
TB2-NIL: 0 IU/mL

## 2022-12-26 ENCOUNTER — Other Ambulatory Visit: Payer: Self-pay | Admitting: Rheumatology

## 2022-12-26 ENCOUNTER — Other Ambulatory Visit: Payer: Self-pay

## 2022-12-26 ENCOUNTER — Other Ambulatory Visit (HOSPITAL_COMMUNITY): Payer: Self-pay

## 2022-12-26 DIAGNOSIS — L4050 Arthropathic psoriasis, unspecified: Secondary | ICD-10-CM

## 2022-12-26 DIAGNOSIS — L409 Psoriasis, unspecified: Secondary | ICD-10-CM

## 2022-12-26 MED ORDER — TALTZ 80 MG/ML ~~LOC~~ SOAJ
80.0000 mg | SUBCUTANEOUS | 0 refills | Status: DC
  Filled 2022-12-26: qty 3, 84d supply, fill #0
  Filled 2023-01-01: qty 1, 28d supply, fill #0
  Filled 2023-02-03: qty 1, 28d supply, fill #1
  Filled 2023-03-03: qty 1, 28d supply, fill #2

## 2022-12-26 NOTE — Telephone Encounter (Signed)
Next Visit: 03/31/2023  Last Visit: 10/27/2022  Last Fill: 09/25/2022  [id number] arthritis   Current Dose per office note 10/27/2022: Taltz 80 mg sq injections every 28 days.   Labs: 12/23/2022  CBC WNL.  Glucose is 133.  Calcium is low-8.4.  globulin remains elevated but stable.  We will continue to monitor.     TB Gold: 12/23/2022  TB gold negative.   Okay to refill Taltz?

## 2022-12-26 NOTE — Progress Notes (Signed)
TB gold negative

## 2022-12-30 ENCOUNTER — Other Ambulatory Visit (HOSPITAL_COMMUNITY): Payer: Self-pay

## 2023-01-01 ENCOUNTER — Other Ambulatory Visit (HOSPITAL_COMMUNITY): Payer: Self-pay

## 2023-01-01 ENCOUNTER — Other Ambulatory Visit: Payer: Self-pay

## 2023-01-05 DIAGNOSIS — Z131 Encounter for screening for diabetes mellitus: Secondary | ICD-10-CM | POA: Diagnosis not present

## 2023-01-05 DIAGNOSIS — Z130 Encounter for screening for diseases of the blood and blood-forming organs and certain disorders involving the immune mechanism: Secondary | ICD-10-CM | POA: Diagnosis not present

## 2023-01-05 DIAGNOSIS — R0683 Snoring: Secondary | ICD-10-CM | POA: Diagnosis not present

## 2023-01-05 DIAGNOSIS — L4050 Arthropathic psoriasis, unspecified: Secondary | ICD-10-CM | POA: Diagnosis not present

## 2023-01-05 DIAGNOSIS — Z1330 Encounter for screening examination for mental health and behavioral disorders, unspecified: Secondary | ICD-10-CM | POA: Diagnosis not present

## 2023-01-05 DIAGNOSIS — R002 Palpitations: Secondary | ICD-10-CM | POA: Diagnosis not present

## 2023-01-05 DIAGNOSIS — I10 Essential (primary) hypertension: Secondary | ICD-10-CM | POA: Diagnosis not present

## 2023-01-05 DIAGNOSIS — Z1329 Encounter for screening for other suspected endocrine disorder: Secondary | ICD-10-CM | POA: Diagnosis not present

## 2023-01-06 ENCOUNTER — Other Ambulatory Visit (HOSPITAL_COMMUNITY): Payer: Self-pay

## 2023-01-06 DIAGNOSIS — E785 Hyperlipidemia, unspecified: Secondary | ICD-10-CM | POA: Diagnosis not present

## 2023-01-06 DIAGNOSIS — I10 Essential (primary) hypertension: Secondary | ICD-10-CM | POA: Diagnosis not present

## 2023-01-06 DIAGNOSIS — R9431 Abnormal electrocardiogram [ECG] [EKG]: Secondary | ICD-10-CM | POA: Diagnosis not present

## 2023-01-06 DIAGNOSIS — R002 Palpitations: Secondary | ICD-10-CM | POA: Diagnosis not present

## 2023-01-06 DIAGNOSIS — R7303 Prediabetes: Secondary | ICD-10-CM | POA: Diagnosis not present

## 2023-01-07 ENCOUNTER — Other Ambulatory Visit (HOSPITAL_COMMUNITY): Payer: Self-pay

## 2023-01-09 DIAGNOSIS — R9431 Abnormal electrocardiogram [ECG] [EKG]: Secondary | ICD-10-CM | POA: Diagnosis not present

## 2023-01-09 DIAGNOSIS — I471 Supraventricular tachycardia, unspecified: Secondary | ICD-10-CM | POA: Diagnosis not present

## 2023-01-09 DIAGNOSIS — G4710 Hypersomnia, unspecified: Secondary | ICD-10-CM | POA: Diagnosis not present

## 2023-01-09 DIAGNOSIS — Z6841 Body Mass Index (BMI) 40.0 and over, adult: Secondary | ICD-10-CM | POA: Diagnosis not present

## 2023-01-09 DIAGNOSIS — E785 Hyperlipidemia, unspecified: Secondary | ICD-10-CM | POA: Diagnosis not present

## 2023-01-09 DIAGNOSIS — I10 Essential (primary) hypertension: Secondary | ICD-10-CM | POA: Diagnosis not present

## 2023-01-09 DIAGNOSIS — R002 Palpitations: Secondary | ICD-10-CM | POA: Diagnosis not present

## 2023-01-12 ENCOUNTER — Ambulatory Visit: Payer: BC Managed Care – PPO | Admitting: Nurse Practitioner

## 2023-01-30 DIAGNOSIS — R9431 Abnormal electrocardiogram [ECG] [EKG]: Secondary | ICD-10-CM | POA: Diagnosis not present

## 2023-01-30 DIAGNOSIS — I471 Supraventricular tachycardia, unspecified: Secondary | ICD-10-CM | POA: Diagnosis not present

## 2023-01-30 DIAGNOSIS — R002 Palpitations: Secondary | ICD-10-CM | POA: Diagnosis not present

## 2023-02-03 ENCOUNTER — Other Ambulatory Visit: Payer: Self-pay

## 2023-02-03 ENCOUNTER — Other Ambulatory Visit (HOSPITAL_COMMUNITY): Payer: Self-pay

## 2023-02-04 ENCOUNTER — Other Ambulatory Visit (HOSPITAL_COMMUNITY): Payer: Self-pay

## 2023-02-26 ENCOUNTER — Other Ambulatory Visit: Payer: Self-pay

## 2023-03-02 ENCOUNTER — Other Ambulatory Visit: Payer: Self-pay

## 2023-03-02 DIAGNOSIS — R197 Diarrhea, unspecified: Secondary | ICD-10-CM | POA: Diagnosis not present

## 2023-03-02 DIAGNOSIS — Z6841 Body Mass Index (BMI) 40.0 and over, adult: Secondary | ICD-10-CM | POA: Diagnosis not present

## 2023-03-02 DIAGNOSIS — Z9189 Other specified personal risk factors, not elsewhere classified: Secondary | ICD-10-CM | POA: Diagnosis not present

## 2023-03-02 NOTE — Progress Notes (Signed)
Office Visit Note  Patient: Joseph [name]             Date of Birth: 21-Aug-1981           MRN: [id number]             PCP: Lucila Maine Referring: Marisue Brooklyn Visit Date: 03/16/2023 Occupation: @GUAROCC @  Subjective:  Stiffness in both hands   History of Present Illness: Joseph [name] is a 42 y.o. male with history of psoriatic arthritis and osteoarthritis.  Patient is taking taltz 80 mg sq injections every month. His most recent injection was yesterday.  He continues to tolerate Taltz without any side effects or injection site reactions.  He denies any signs or symptoms of a psoriatic arthritis flare.  He has no active psoriasis at this time.  He denies any Achilles tendinitis or plantar fasciitis.  He experiences occasional discomfort in both knee joints especially when climbing up and down steps.  He denies any SI joint pain at this time.  He has been experiencing stiffness in both hands especially first thing in the morning but denies any joint swelling.  He denies any new medical conditions.  He denies any recent or recurrent infections.  Activities of Daily Living:  Patient reports morning stiffness for 0 minutes.   Patient Reports nocturnal pain.  Difficulty dressing/grooming: Denies Difficulty climbing stairs: Denies Difficulty getting out of chair: Denies Difficulty using hands for taps, buttons, cutlery, and/or writing: Denies  Review of Systems  Constitutional:  Negative for fatigue.  HENT:  Negative for mouth sores and mouth dryness.   Eyes:  Negative for dryness.  Respiratory:  Negative for shortness of breath.   Cardiovascular:  Negative for chest pain and palpitations.  Gastrointestinal:  Negative for blood in stool, constipation and diarrhea.  Endocrine: Negative for increased urination.  Genitourinary:  Negative for involuntary urination.  Musculoskeletal:  Positive for joint pain and joint pain. Negative for gait problem, joint swelling,  myalgias, muscle weakness, morning stiffness, muscle tenderness and myalgias.  Skin:  Negative for color change, rash, hair loss and sensitivity to sunlight.  Allergic/Immunologic: Negative for susceptible to infections.  Neurological:  Negative for dizziness and headaches.  Hematological:  Negative for swollen glands.  Psychiatric/Behavioral:  Negative for depressed mood and sleep disturbance. The patient is not nervous/anxious.     PMFS History:  Patient Active Problem List   Diagnosis Date Noted   Acute medial meniscus tear of right knee    Elbow dislocation, right, initial encounter 03/06/2020   Closed disp fx of right olecranon with intraartic exten w malunion 02/14/2020   Morbid obesity with BMI of 45.0-49.9, adult (HCC) 11/25/2019   Hair loss 12/31/2017   Varicose veins of both lower extremities 07/26/2017   Iron deficiency anemia 09/21/2014   Psoriatic arthritis (HCC) 09/21/2014   ESSENTIAL HYPERTENSION, MALIGNANT 02/18/2009   Nonspecific (abnormal) findings on radiological and other examination of body structure 02/16/2009   CHEST XRAY, ABNORMAL 02/16/2009    Past Medical History:  Diagnosis Date   Acute sinus infection 11/07/2014   Arthritis    Blood transfusion without reported diagnosis    At [facility]    Difficult intubation 01/13/2020   difficult airway in OR (per Dr Rollene Fare)   Iron deficiency anemia 09/21/2014   Psoriasis    Psoriatic arthritis (HCC)    Psoriatic arthritis (HCC)    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 Antoine Poche 02/2009 with echo, as needed f/u   Torn meniscus    right knee    Family History  Problem Relation Age of Onset   Pulmonary fibrosis Mother    Hypertension Mother    Pancreatic cancer Maternal Grandmother    Rheum arthritis Paternal Aunt    Cancer Paternal Grandmother    Breast cancer Paternal Grandmother    COPD Paternal  Grandfather    CAD Paternal Grandfather    Colon cancer Neg Hx    Prostate cancer Neg Hx    Diabetes Neg Hx    Thyroid disease Neg Hx    Liver cancer Neg Hx    Stomach cancer Neg Hx    Rectal cancer Neg Hx    Past Surgical History:  Procedure Laterality Date   COLONOSCOPY WITH ESOPHAGOGASTRODUODENOSCOPY (EGD)     ENDOVENOUS ABLATION SAPHENOUS VEIN W/ LASER  10/16/2022   endovenous laser ablation right greater saphenous vein by Coral Else MD   EXTERNAL FIXATION REMOVAL Right 03/22/2020   Procedure: REMOVAL EXTERNAL FIXATION ARM;  Surgeon: Roby Lofts, MD;  Location: [facility] OR;  Service: Orthopedics;  Laterality: Right;   HERNIA REPAIR  1994   umbicial   INNER EAR SURGERY Left    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, 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   KNEE ARTHROSCOPY Right 05/10/2021   Procedure: ARTHROSCOPY KNEE partial medial meniscectomy;  Surgeon: Cammy Copa, MD;  Location: [facility];  Service: Orthopedics;  Laterality: Right;   LIGAMENT REPAIR Right 01/13/2020   Procedure: COLLATERAL LIGAMENT REPAIRS;  Surgeon: Mack Hook, MD;  Location: [facility];  Service: Orthopedics;  Laterality: Right;   ORIF ELBOW FRACTURE Right 02/17/2020   Procedure: REVISION OPEN REDUCTION INTERNAL FIXATION (ORIF) ELBOW/OLECRANON FRACTURE;  Surgeon: Roby Lofts, MD;  Location: [facility] OR;  Service: Orthopedics;  Laterality: Right;   ORIF ULNAR FRACTURE Right 01/13/2020   Procedure: OPEN TREATMENT OF COMPLEX RIGHT PROXIMAL ULNA FRACTURE;  Surgeon: Mack Hook, MD;  Location: [facility];  Service: Orthopedics;  Laterality: Right;  PROCEDURE: OPEN TREATMENT OF COMPLEX RIGHT PROXIMAL ULNA FRACTURE WITH POSSIBLE COLLATERAL LIGAMENT REPAIRS LENGTH OF SURGERY: 2.5 HOURSMAC + REGIONAL BLOCK   Social History   Social History Narrative   Right handed   Caffeien use:1 soda per day   Immunization History  Administered Date(s) Administered   Influenza,inj,Quad  PF,6+ Mos 11/20/2021   MMR 02/12/2001   PFIZER Comirnaty(Gray Top)Covid-19 Tri-Sucrose Vaccine 09/21/2020, 10/12/2020   PNEUMOCOCCAL CONJUGATE-20 11/20/2021   Pneumococcal Polysaccharide-23 11/10/2012   Td 08/17/2001     Objective: Vital Signs: BP 120/84 (BP Location: Left Arm, Patient Position: Sitting, Cuff Size: Large)   Pulse 86   Resp 18   Ht 5\' 10"  (1.778 m)   Wt (!) 307 lb (139.3 kg)   BMI 44.05 kg/m    Physical Exam Vitals and nursing note reviewed.  Constitutional:      Appearance: [name] well-developed.  HENT:     Head: Normocephalic and atraumatic.  Eyes:     Conjunctiva/sclera: Conjunctivae normal.     Pupils: Pupils are equal, round, and reactive to light.  Cardiovascular:     Rate and Rhythm: Normal rate and regular rhythm.     Heart sounds: Normal heart sounds.  Pulmonary:     Effort: Pulmonary effort is normal.     Breath sounds: Normal breath sounds.  Abdominal:     General: Bowel sounds are normal.  Palpations: Abdomen is soft.  Musculoskeletal:     Cervical back: Normal range of motion and neck supple.  Skin:    General: Skin is warm and dry.     Capillary Refill: Capillary refill takes less than 2 seconds.  Neurological:     Mental Status: [name] alert and oriented to person, place, and time.  Psychiatric:        Behavior: Behavior normal.      Musculoskeletal Exam: C-spine, thoracic spine, and lumbar spine good ROM.  No midline spinal tenderness.  No SI joint tenderness.  Shoulder joints have good range of motion.  Right elbow joint flexion contracture.  Wrist joints have slightly limited range of motion.  Flexion contracture of the left fifth PIP joint.  No tenderness or synovitis over the MCP or PIP joints.  Complete fist formation noted.  Hip joints have good range of motion with no groin pain.  Knee joints have good range of motion with no warmth or effusion.  Ankle joints have good range of motion with no tenderness or joint swelling.  No  evidence of Achilles tendinitis or plantar fasciitis.  CDAI Exam: CDAI Score: -- Patient Global: --; Provider Global: -- Swollen: --; Tender: -- Joint Exam 03/16/2023   No joint exam has been documented for this visit   There is currently no information documented on the homunculus. Go to the Rheumatology activity and complete the homunculus joint exam.  Investigation: No additional findings.  Imaging: No results found.  Recent Labs: Lab Results  Component Value Date   WBC 7.0 12/23/2022   HGB 14.6 12/23/2022   PLT 187 12/23/2022   NA 137 12/23/2022   K 3.5 12/23/2022   CL 101 12/23/2022   CO2 26 12/23/2022   GLUCOSE 133 (H) 12/23/2022   BUN 10 12/23/2022   CREATININE 0.95 12/23/2022   BILITOT 0.5 12/23/2022   ALKPHOS 128 (H) 04/14/2022   AST 13 12/23/2022   ALT 11 12/23/2022   PROT 7.8 12/23/2022   ALBUMIN 4.2 04/14/2022   CALCIUM 8.4 (L) 12/23/2022   GFRAA 112 04/19/2021   QFTBGOLD Negative 04/03/2017   QFTBGOLDPLUS NEGATIVE 12/23/2022    Speciality Comments: Prior therapy: Enbrel/Humira (inadequate response)  and Remicaide (d/c due to cost and time consuming infusion), Cosentyx stopped 04/19/21. Talt started 05/27/21  FU q 3 months  Procedures:  No procedures performed Allergies: Sulfonamide derivatives and Bee venom   Assessment / Plan:     Visit Diagnoses: Psoriatic arthritis (HCC): He has no synovitis or dactylitis on examination today.  He has not had any signs or symptoms of a psoriatic arthritis flare.  He has no active psoriasis at this time.  No evidence of Achilles tendinitis or plantar fasciitis.  No SI joint tenderness upon palpation.  He has been experiencing some stiffness in his hands first thing in the morning but has no active inflammation.  Discussed the importance of joint protection and muscle strengthening.  He has not had any recent or recurrent infections.  He will remain on Taltz as monotherapy.  He was advised to notify us if he develops  signs or symptoms of a flare.  He will follow-up in the office in 5 months or sooner if needed.  Psoriasis: No active psoriasis at this time.  High risk medication use - Taltz 80 mg sq injections every 28 days. Previously had inadequate response to Enbrel, Humira, cosentyx and Remicade.  CBC and CMP updated on 12/23/22. Orders for CBC and CMP released today.  His next lab work will be due in August and every 3 months.   TB gold negative on 12/23/22.  No recent or recurrent infections.  Discussed the importance of holding taltz if he develops signs or symptoms of an infection and to resume once the infection has completely cleared.   - Plan: CBC with Differential/Platelet, COMPLETE METABOLIC PANEL WITH GFR  Varicose veins of both lower extremities, unspecified whether complicated  Primary osteoarthritis of both hands: No tenderness or inflammation.  Complete fist formation bilaterally.  Primary osteoarthritis of left knee: He experiences some increased discomfort in both knee joints when going up and down steps.  On examination he has good range of motion of both knees with no warmth or effusion.  Tendinopathy of right shoulder: Good range of motion with no discomfort at this time.  History of torn meniscus of right knee - MRI on 04/06/2021.  No mechanical symptoms at this time.  Chondromalacia patellae of right knee: He has good range of motion of the right knee joint with no warmth or effusion.  He experiences increased discomfort in both knee joints especially when going up and down steps.  Other medical conditions are listed as follows:  Closed fracture of proximal end of right ulna with routine healing, unspecified fracture morphology, subsequent encounter - Previous fracture of the right proximal ulna on 12/15/2019 and underwent internal fixation by Dr. Janee Morn on 01/13/2020.  History of hypertension: Blood pressure was 120/84 today in the office.  History of iron deficiency  anemia  Orders: Orders Placed This Encounter  Procedures   CBC with Differential/Platelet   COMPLETE METABOLIC PANEL WITH GFR   No orders of the defined types were placed in this encounter.   Follow-Up Instructions: Return in about 5 months (around 08/16/2023) for Psoriatic arthritis, Osteoarthritis.   Gearldine Bienenstock, PA-C  Note - This record has been created using Dragon software.  Chart creation errors have been sought, but may not always  have been located. Such creation errors do not reflect on  the standard of medical care.

## 2023-03-03 ENCOUNTER — Other Ambulatory Visit (HOSPITAL_COMMUNITY): Payer: Self-pay

## 2023-03-09 ENCOUNTER — Other Ambulatory Visit: Payer: Self-pay

## 2023-03-16 ENCOUNTER — Ambulatory Visit: Payer: BC Managed Care – PPO | Attending: Rheumatology | Admitting: Physician Assistant

## 2023-03-16 ENCOUNTER — Encounter: Payer: Self-pay | Admitting: Physician Assistant

## 2023-03-16 VITALS — BP 120/84 | HR 86 | Resp 18 | Ht 70.0 in | Wt 307.0 lb

## 2023-03-16 DIAGNOSIS — M19042 Primary osteoarthritis, left hand: Secondary | ICD-10-CM

## 2023-03-16 DIAGNOSIS — M2241 Chondromalacia patellae, right knee: Secondary | ICD-10-CM

## 2023-03-16 DIAGNOSIS — I8393 Asymptomatic varicose veins of bilateral lower extremities: Secondary | ICD-10-CM

## 2023-03-16 DIAGNOSIS — L4050 Arthropathic psoriasis, unspecified: Secondary | ICD-10-CM | POA: Diagnosis not present

## 2023-03-16 DIAGNOSIS — L409 Psoriasis, unspecified: Secondary | ICD-10-CM

## 2023-03-16 DIAGNOSIS — Z79899 Other long term (current) drug therapy: Secondary | ICD-10-CM

## 2023-03-16 DIAGNOSIS — M67911 Unspecified disorder of synovium and tendon, right shoulder: Secondary | ICD-10-CM

## 2023-03-16 DIAGNOSIS — M19041 Primary osteoarthritis, right hand: Secondary | ICD-10-CM

## 2023-03-16 DIAGNOSIS — Z862 Personal history of diseases of the blood and blood-forming organs and certain disorders involving the immune mechanism: Secondary | ICD-10-CM

## 2023-03-16 DIAGNOSIS — M1712 Unilateral primary osteoarthritis, left knee: Secondary | ICD-10-CM

## 2023-03-16 DIAGNOSIS — S52001D Unspecified fracture of upper end of right ulna, subsequent encounter for closed fracture with routine healing: Secondary | ICD-10-CM

## 2023-03-16 DIAGNOSIS — Z87828 Personal history of other (healed) physical injury and trauma: Secondary | ICD-10-CM

## 2023-03-16 DIAGNOSIS — Z8679 Personal history of other diseases of the circulatory system: Secondary | ICD-10-CM

## 2023-03-17 NOTE — Progress Notes (Signed)
CBC stable.  Globulin continues to trend up gradually.  Can SPEP be added? If not please pend future order for SPEP to be drawn with next lab work

## 2023-03-20 LAB — PROTEIN ELECTROPHORESIS, SERUM
Albumin ELP: 3.7 g/dL — ABNORMAL LOW (ref 3.8–4.8)
Alpha 1: 0.3 g/dL (ref 0.2–0.3)
Alpha 2: 0.7 g/dL (ref 0.5–0.9)
Beta 2: 0.7 g/dL — ABNORMAL HIGH (ref 0.2–0.5)
Beta Globulin: 0.6 g/dL (ref 0.4–0.6)
Gamma Globulin: 2 g/dL — ABNORMAL HIGH (ref 0.8–1.7)
Total Protein: 8 g/dL (ref 6.1–8.1)

## 2023-03-20 LAB — CBC WITH DIFFERENTIAL/PLATELET
Absolute Monocytes: 692 cells/uL (ref 200–950)
Basophils Absolute: 36 cells/uL (ref 0–200)
Basophils Relative: 0.4 %
Eosinophils Absolute: 373 cells/uL (ref 15–500)
Eosinophils Relative: 4.1 %
HCT: 45.2 % (ref 38.5–50.0)
Hemoglobin: 14.4 g/dL (ref 13.2–17.1)
Lymphs Abs: 1866 cells/uL (ref 850–3900)
MCH: 26.6 pg — ABNORMAL LOW (ref 27.0–33.0)
MCHC: 31.9 g/dL — ABNORMAL LOW (ref 32.0–36.0)
MCV: 83.4 fL (ref 80.0–100.0)
MPV: 11.8 fL (ref 7.5–12.5)
Monocytes Relative: 7.6 %
Neutro Abs: 6133 cells/uL (ref 1500–7800)
Neutrophils Relative %: 67.4 %
Platelets: 215 10*3/uL (ref 140–400)
RBC: 5.42 10*6/uL (ref 4.20–5.80)
RDW: 14.6 % (ref 11.0–15.0)
Total Lymphocyte: 20.5 %
WBC: 9.1 10*3/uL (ref 3.8–10.8)

## 2023-03-20 LAB — COMPLETE METABOLIC PANEL WITH GFR
AG Ratio: 0.9 (calc) — ABNORMAL LOW (ref 1.0–2.5)
ALT: 15 U/L (ref 9–46)
AST: 14 U/L (ref 10–40)
Albumin: 3.6 g/dL (ref 3.6–5.1)
Alkaline phosphatase (APISO): 95 U/L (ref 36–130)
BUN: 9 mg/dL (ref 7–25)
CO2: 29 mmol/L (ref 20–32)
Calcium: 8.6 mg/dL (ref 8.6–10.3)
Chloride: 102 mmol/L (ref 98–110)
Creat: 0.91 mg/dL (ref 0.60–1.26)
Globulin: 4.2 g/dL (calc) — ABNORMAL HIGH (ref 1.9–3.7)
Glucose, Bld: 83 mg/dL (ref 65–99)
Potassium: 4 mmol/L (ref 3.5–5.3)
Sodium: 140 mmol/L (ref 135–146)
Total Bilirubin: 0.5 mg/dL (ref 0.2–1.2)
Total Protein: 7.8 g/dL (ref 6.1–8.1)
eGFR: 110 mL/min/{1.73_m2} (ref >=60)

## 2023-03-20 LAB — TEST AUTHORIZATION

## 2023-03-20 NOTE — Progress Notes (Signed)
SPEP did not reveal any abnormal protein bands.

## 2023-03-23 DIAGNOSIS — R9431 Abnormal electrocardiogram [ECG] [EKG]: Secondary | ICD-10-CM | POA: Diagnosis not present

## 2023-03-23 DIAGNOSIS — R002 Palpitations: Secondary | ICD-10-CM | POA: Diagnosis not present

## 2023-03-27 ENCOUNTER — Telehealth: Payer: Self-pay | Admitting: Pharmacist

## 2023-03-27 NOTE — Telephone Encounter (Signed)
Submitted a Prior Authorization renewal request to [facility] for TALTZ via CoverMyMeds. Pending clinical questions to populate  Key: [id number]  Chesley Mires, PharmD, MPH, BCPS, CPP Clinical Pharmacist (Rheumatology and Pulmonology)

## 2023-03-27 NOTE — Telephone Encounter (Signed)
Clinical questions for Taltz PA renewal completed on [facility]

## 2023-03-30 NOTE — Telephone Encounter (Signed)
Received notification from [facility] regarding a prior authorization for TALTZ. Authorization has been APPROVED from 03/27/23 to 03/26/24. Approval letter sent to scan center.  Patient can continue to fill through [facility]: [phone]   Authorization # [id number] Phone # [phone]  [employer] updated  Chesley Mires, PharmD, MPH, BCPS, CPP Clinical Pharmacist (Rheumatology and Pulmonology)

## 2023-03-31 ENCOUNTER — Ambulatory Visit: Payer: BC Managed Care – PPO | Admitting: Rheumatology

## 2023-04-03 ENCOUNTER — Other Ambulatory Visit: Payer: Self-pay

## 2023-04-03 ENCOUNTER — Other Ambulatory Visit: Payer: Self-pay | Admitting: Physician Assistant

## 2023-04-03 ENCOUNTER — Other Ambulatory Visit (HOSPITAL_COMMUNITY): Payer: Self-pay

## 2023-04-03 DIAGNOSIS — L4050 Arthropathic psoriasis, unspecified: Secondary | ICD-10-CM

## 2023-04-03 DIAGNOSIS — L409 Psoriasis, unspecified: Secondary | ICD-10-CM

## 2023-04-03 MED ORDER — TALTZ 80 MG/ML ~~LOC~~ SOAJ
80.0000 mg | SUBCUTANEOUS | 0 refills | Status: DC
  Filled 2023-04-03: qty 1, 28d supply, fill #0
  Filled 2023-04-30: qty 1, 28d supply, fill #1
  Filled 2023-05-27: qty 1, 28d supply, fill #2

## 2023-04-03 NOTE — Telephone Encounter (Signed)
Last Fill: 12/26/2022  Labs: 03/16/2023 CBC stable. Globulin continues to trend up gradually.  TB Gold: 12/23/2022   Next Visit: 08/19/2023  Last Visit: 03/16/2023  DX: Psoriatic arthritis   Current Dose per office note 03/16/2023: Taltz 80 mg sq injections every 28 days.   Okay to refill Taltz?

## 2023-04-07 ENCOUNTER — Other Ambulatory Visit: Payer: Self-pay

## 2023-04-07 ENCOUNTER — Other Ambulatory Visit (HOSPITAL_COMMUNITY): Payer: Self-pay

## 2023-04-21 ENCOUNTER — Ambulatory Visit: Payer: BC Managed Care – PPO | Admitting: Gastroenterology

## 2023-04-28 ENCOUNTER — Other Ambulatory Visit (HOSPITAL_COMMUNITY): Payer: Self-pay

## 2023-04-30 ENCOUNTER — Other Ambulatory Visit (HOSPITAL_COMMUNITY): Payer: Self-pay

## 2023-05-01 ENCOUNTER — Other Ambulatory Visit (HOSPITAL_COMMUNITY): Payer: Self-pay

## 2023-05-11 DIAGNOSIS — I3139 Other pericardial effusion (noninflammatory): Secondary | ICD-10-CM | POA: Diagnosis not present

## 2023-05-11 DIAGNOSIS — I471 Supraventricular tachycardia, unspecified: Secondary | ICD-10-CM | POA: Diagnosis not present

## 2023-05-11 DIAGNOSIS — I10 Essential (primary) hypertension: Secondary | ICD-10-CM | POA: Diagnosis not present

## 2023-05-11 DIAGNOSIS — R0683 Snoring: Secondary | ICD-10-CM | POA: Diagnosis not present

## 2023-05-27 ENCOUNTER — Other Ambulatory Visit (HOSPITAL_COMMUNITY): Payer: Self-pay

## 2023-05-29 ENCOUNTER — Other Ambulatory Visit (HOSPITAL_COMMUNITY): Payer: Self-pay

## 2023-06-01 DIAGNOSIS — I3139 Other pericardial effusion (noninflammatory): Secondary | ICD-10-CM | POA: Diagnosis not present

## 2023-06-08 DIAGNOSIS — Z538 Procedure and treatment not carried out for other reasons: Secondary | ICD-10-CM | POA: Diagnosis not present

## 2023-06-08 DIAGNOSIS — R197 Diarrhea, unspecified: Secondary | ICD-10-CM | POA: Diagnosis not present

## 2023-06-08 DIAGNOSIS — Z539 Procedure and treatment not carried out, unspecified reason: Secondary | ICD-10-CM | POA: Diagnosis not present

## 2023-06-08 DIAGNOSIS — G4730 Sleep apnea, unspecified: Secondary | ICD-10-CM | POA: Diagnosis not present

## 2023-06-09 DIAGNOSIS — R197 Diarrhea, unspecified: Secondary | ICD-10-CM | POA: Diagnosis not present

## 2023-06-18 ENCOUNTER — Other Ambulatory Visit: Payer: Self-pay | Admitting: Medical

## 2023-06-24 ENCOUNTER — Other Ambulatory Visit: Payer: Self-pay | Admitting: Internal Medicine

## 2023-06-24 ENCOUNTER — Other Ambulatory Visit (HOSPITAL_COMMUNITY): Payer: Self-pay

## 2023-06-24 ENCOUNTER — Other Ambulatory Visit: Payer: Self-pay

## 2023-06-24 DIAGNOSIS — L4050 Arthropathic psoriasis, unspecified: Secondary | ICD-10-CM

## 2023-06-24 DIAGNOSIS — L409 Psoriasis, unspecified: Secondary | ICD-10-CM

## 2023-06-24 MED ORDER — TALTZ 80 MG/ML ~~LOC~~ SOAJ
80.0000 mg | SUBCUTANEOUS | 0 refills | Status: DC
  Filled 2023-06-24 – 2023-06-29 (×2): qty 1, 28d supply, fill #0

## 2023-06-24 NOTE — Telephone Encounter (Signed)
Last Fill: 04/03/2023  Labs: 03/16/2023 CBC stable. Globulin continues to trend up gradually. SPEP did not reveal any abnormal protein bands   TB Gold: 12/23/2022 Neg    Next Visit: 08/19/2023  Last Visit: 03/16/2023  [id number] arthritis   Current Dose per office note 03/16/2023: Taltz 80 mg sq injections every 28 days.   Left message to advise patient he is due for labs.   Okay to refill Taltz?

## 2023-06-26 ENCOUNTER — Other Ambulatory Visit: Payer: Self-pay | Admitting: *Deleted

## 2023-06-26 DIAGNOSIS — Z79899 Other long term (current) drug therapy: Secondary | ICD-10-CM | POA: Diagnosis not present

## 2023-06-27 LAB — CBC WITH DIFFERENTIAL/PLATELET
Absolute Monocytes: 566 {cells}/uL (ref 200–950)
Basophils Absolute: 48 {cells}/uL (ref 0–200)
Basophils Relative: 0.5 %
Eosinophils Absolute: 298 {cells}/uL (ref 15–500)
Eosinophils Relative: 3.1 %
HCT: 46.6 % (ref 38.5–50.0)
Hemoglobin: 15 g/dL (ref 13.2–17.1)
Lymphs Abs: 2074 {cells}/uL (ref 850–3900)
MCH: 27.4 pg (ref 27.0–33.0)
MCHC: 32.2 g/dL (ref 32.0–36.0)
MCV: 85 fL (ref 80.0–100.0)
MPV: 12 fL (ref 7.5–12.5)
Monocytes Relative: 5.9 %
Neutro Abs: 6614 {cells}/uL (ref 1500–7800)
Neutrophils Relative %: 68.9 %
Platelets: 212 10*3/uL (ref 140–400)
RBC: 5.48 10*6/uL (ref 4.20–5.80)
RDW: 14.1 % (ref 11.0–15.0)
Total Lymphocyte: 21.6 %
WBC: 9.6 10*3/uL (ref 3.8–10.8)

## 2023-06-27 LAB — COMPLETE METABOLIC PANEL WITH GFR
AG Ratio: 0.9 (calc) — ABNORMAL LOW (ref 1.0–2.5)
ALT: 16 U/L (ref 9–46)
AST: 15 U/L (ref 10–40)
Albumin: 3.7 g/dL (ref 3.6–5.1)
Alkaline phosphatase (APISO): 95 U/L (ref 36–130)
BUN: 11 mg/dL (ref 7–25)
CO2: 24 mmol/L (ref 20–32)
Calcium: 9 mg/dL (ref 8.6–10.3)
Chloride: 101 mmol/L (ref 98–110)
Creat: 0.95 mg/dL (ref 0.60–1.29)
Globulin: 4 g/dL — ABNORMAL HIGH (ref 1.9–3.7)
Glucose, Bld: 108 mg/dL — ABNORMAL HIGH (ref 65–99)
Potassium: 3.8 mmol/L (ref 3.5–5.3)
Sodium: 136 mmol/L (ref 135–146)
Total Bilirubin: 0.6 mg/dL (ref 0.2–1.2)
Total Protein: 7.7 g/dL (ref 6.1–8.1)
eGFR: 104 mL/min/{1.73_m2} (ref >=60)

## 2023-06-28 NOTE — Progress Notes (Signed)
CBC WNL.  Glucose is 108.  Globulin remains elevated but has improved. Rest of CMP WNL.

## 2023-06-29 ENCOUNTER — Other Ambulatory Visit: Payer: Self-pay

## 2023-06-29 ENCOUNTER — Other Ambulatory Visit (HOSPITAL_COMMUNITY): Payer: Self-pay

## 2023-07-20 MED ORDER — CLOBETASOL PROPIONATE 0.05 % EX CREA: TOPICAL_CREAM | Freq: Two times a day (BID) | CUTANEOUS | 1 refills | Status: AC | PRN

## 2023-07-20 NOTE — Telephone Encounter (Signed)
Last Fill: 08/14/2021  Next Visit: 08/17/2023  Last Visit: 03/16/2023  Dx: Psoriasis   Current Dose per office note on 03/16/2023: not discussed  Okay to refill Clobetasol Cream?

## 2023-07-23 ENCOUNTER — Other Ambulatory Visit (HOSPITAL_COMMUNITY): Payer: Self-pay

## 2023-07-23 ENCOUNTER — Other Ambulatory Visit: Payer: Self-pay | Admitting: Physician Assistant

## 2023-07-23 DIAGNOSIS — L4050 Arthropathic psoriasis, unspecified: Secondary | ICD-10-CM

## 2023-07-23 DIAGNOSIS — L409 Psoriasis, unspecified: Secondary | ICD-10-CM

## 2023-07-23 MED ORDER — TALTZ 80 MG/ML ~~LOC~~ SOAJ
80.0000 mg | SUBCUTANEOUS | 0 refills | Status: DC
  Filled 2023-07-23: qty 1, 28d supply, fill #0
  Filled 2023-08-24: qty 1, 28d supply, fill #1
  Filled 2023-09-14: qty 1, 28d supply, fill #2

## 2023-07-23 NOTE — Telephone Encounter (Signed)
Last Fill: 06/24/2023 (30 day supply)  Labs: 06/26/2023 CBC WNL. Glucose is 108. Globulin remains elevated but has improved. Rest of CMP WNL.  TB Gold: 12/23/2022 Neg    Next Visit: 08/17/2023  Last Visit: 03/16/2023  [id number] arthritis   Current Dose per office note 03/16/2023: Taltz 80 mg sq injections every 28 days.   Okay to refill Taltz?

## 2023-07-28 ENCOUNTER — Other Ambulatory Visit: Payer: Self-pay

## 2023-07-28 ENCOUNTER — Other Ambulatory Visit (HOSPITAL_COMMUNITY): Payer: Self-pay

## 2023-08-04 NOTE — Progress Notes (Deleted)
Office Visit Note  Patient: Joseph [name]             Date of Birth: December 27, 1980           MRN: [id number]             PCP: Dani Gobble, PA-C Referring: Fransisca Kaufmann* Visit Date: 08/17/2023 Occupation: @GUAROCC @  Subjective:  No chief complaint on file.   History of Present Illness: Joseph [name] is a 42 y.o. male ***     Activities of Daily Living:  Patient reports morning stiffness for *** {minute/hour:19697}.   Patient {ACTIONS;DENIES/REPORTS:21021675::"Denies"} nocturnal pain.  Difficulty dressing/grooming: {ACTIONS;DENIES/REPORTS:21021675::"Denies"} Difficulty climbing stairs: {ACTIONS;DENIES/REPORTS:21021675::"Denies"} Difficulty getting out of chair: {ACTIONS;DENIES/REPORTS:21021675::"Denies"} Difficulty using hands for taps, buttons, cutlery, and/or writing: {ACTIONS;DENIES/REPORTS:21021675::"Denies"}  No Rheumatology ROS completed.   PMFS History:  Patient Active Problem List   Diagnosis Date Noted   Acute medial meniscus tear of right knee    Elbow dislocation, right, initial encounter 03/06/2020   Closed disp fx of right olecranon with intraartic exten w malunion 02/14/2020   Morbid obesity with BMI of 45.0-49.9, adult (HCC) 11/25/2019   Hair loss 12/31/2017   Varicose veins of both lower extremities 07/26/2017   Iron deficiency anemia 09/21/2014   Psoriatic arthritis (HCC) 09/21/2014   ESSENTIAL HYPERTENSION, MALIGNANT 02/18/2009   Nonspecific (abnormal) findings on radiological and other examination of body structure 02/16/2009   CHEST XRAY, ABNORMAL 02/16/2009    Past Medical History:  Diagnosis Date   Acute sinus infection 11/07/2014   Arthritis    Blood transfusion without reported diagnosis    At [facility]    Difficult intubation 01/13/2020   difficult airway in OR (per Dr Rollene Fare)   Iron deficiency anemia 09/21/2014   Psoriasis    Psoriatic arthritis (HCC)    Psoriatic arthritis (HCC)    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 Antoine Poche 02/2009 with echo, as needed f/u   Torn meniscus    right knee    Family History  Problem Relation Age of Onset   Pulmonary fibrosis Mother    Hypertension Mother    Pancreatic cancer Maternal Grandmother    Rheum arthritis Paternal Aunt    Cancer Paternal Grandmother    Breast cancer Paternal Grandmother    COPD Paternal Grandfather    CAD Paternal Grandfather    Colon cancer Neg Hx    Prostate cancer Neg Hx    Diabetes Neg Hx    Thyroid disease Neg Hx    Liver cancer Neg Hx    Stomach cancer Neg Hx    Rectal cancer Neg Hx    Past Surgical History:  Procedure Laterality Date   COLONOSCOPY WITH ESOPHAGOGASTRODUODENOSCOPY (EGD)     ENDOVENOUS ABLATION SAPHENOUS VEIN W/ LASER  10/16/2022   endovenous laser ablation right greater saphenous vein by Coral Else MD   EXTERNAL FIXATION REMOVAL Right 03/22/2020   Procedure: REMOVAL EXTERNAL FIXATION ARM;  Surgeon: Roby Lofts, MD;  Location: [facility];  Service: Orthopedics;  Laterality: Right;   HERNIA REPAIR  1994   umbicial   INNER EAR SURGERY Left    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, 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   KNEE ARTHROSCOPY Right 05/10/2021   Procedure: ARTHROSCOPY KNEE partial medial meniscectomy;  Surgeon: Cammy Copa, MD;  Location: [facility];  Service: Orthopedics;  Laterality: Right;   LIGAMENT  REPAIR Right 01/13/2020   Procedure: COLLATERAL LIGAMENT REPAIRS;  Surgeon: Mack Hook, MD;  Location: [facility];  Service: Orthopedics;  Laterality: Right;   ORIF ELBOW FRACTURE Right 02/17/2020   Procedure: REVISION OPEN REDUCTION INTERNAL FIXATION (ORIF) ELBOW/OLECRANON FRACTURE;  Surgeon: Roby Lofts, MD;  Location: [facility];  Service: Orthopedics;  Laterality: Right;   ORIF ULNAR FRACTURE Right 01/13/2020   Procedure: OPEN TREATMENT OF COMPLEX RIGHT PROXIMAL ULNA  FRACTURE;  Surgeon: Mack Hook, MD;  Location: [facility];  Service: Orthopedics;  Laterality: Right;  PROCEDURE: OPEN TREATMENT OF COMPLEX RIGHT PROXIMAL ULNA FRACTURE WITH POSSIBLE COLLATERAL LIGAMENT REPAIRS LENGTH OF SURGERY: 2.5 HOURSMAC + REGIONAL BLOCK   Social History   Social History Narrative   Right handed   Caffeien use:1 soda per day   Immunization History  Administered Date(s) Administered   Influenza,inj,Quad PF,6+ Mos 11/20/2021   MMR 02/12/2001   PFIZER Comirnaty(Gray Top)Covid-19 Tri-Sucrose Vaccine 09/21/2020, 10/12/2020   PNEUMOCOCCAL CONJUGATE-20 11/20/2021   Pneumococcal Polysaccharide-23 11/10/2012   Td 08/17/2001     Objective: Vital Signs: There were no vitals taken for this visit.   Physical Exam   Musculoskeletal Exam: ***  CDAI Exam: CDAI Score: -- Patient Global: --; Provider Global: -- Swollen: --; Tender: -- Joint Exam 08/17/2023   No joint exam has been documented for this visit   There is currently no information documented on the homunculus. Go to the Rheumatology activity and complete the homunculus joint exam.  Investigation: No additional findings.  Imaging: No results found.  Recent Labs: Lab Results  Component Value Date   WBC 9.6 06/26/2023   HGB 15.0 06/26/2023   PLT 212 06/26/2023   NA 136 06/26/2023   K 3.8 06/26/2023   CL 101 06/26/2023   CO2 24 06/26/2023   GLUCOSE 108 (H) 06/26/2023   BUN 11 06/26/2023   CREATININE 0.95 06/26/2023   BILITOT 0.6 06/26/2023   ALKPHOS 128 (H) 04/14/2022   AST 15 06/26/2023   ALT 16 06/26/2023   PROT 7.7 06/26/2023   ALBUMIN 4.2 04/14/2022   CALCIUM 9.0 06/26/2023   GFRAA 112 04/19/2021   QFTBGOLD Negative 04/03/2017   QFTBGOLDPLUS NEGATIVE 12/23/2022    Speciality Comments: Prior therapy: Enbrel/Humira (inadequate response)  and Remicaide (d/c due to cost and time consuming infusion), Cosentyx stopped 04/19/21. Talt started 05/27/21  FU q 3  months  Procedures:  No procedures performed Allergies: Sulfonamide derivatives and Bee venom   Assessment / Plan:     Visit Diagnoses: Psoriatic arthritis (HCC)  Psoriasis  High risk medication use  Varicose veins of both lower extremities, unspecified whether complicated  Primary osteoarthritis of both hands  Primary osteoarthritis of left knee  Tendinopathy of right shoulder  History of torn meniscus of right knee  Chondromalacia patellae of right knee  Closed fracture of proximal end of right ulna with routine healing, unspecified fracture morphology, subsequent encounter  History of hypertension  History of iron deficiency anemia  Orders: No orders of the defined types were placed in this encounter.  No orders of the defined types were placed in this encounter.   Face-to-face time spent with patient was *** minutes. Greater than 50% of time was spent in counseling and coordination of care.  Follow-Up Instructions: No follow-ups on file.   Gearldine Bienenstock, PA-C  Note - This record has been created using Dragon software.  Chart creation errors have been sought, but may not always  have been located. Such creation  errors do not reflect on  the standard of medical care.

## 2023-08-10 NOTE — Progress Notes (Unsigned)
Office Visit Note  Patient: Joseph [name]             Date of Birth: July 08, 1981           MRN: [id number]             PCP: Dani Gobble, PA-C Referring: Fransisca Kaufmann* Visit Date: 08/11/2023 Occupation: @GUAROCC @  Subjective:  Right hand stiffness   History of Present Illness: Joseph [name] is a 42 y.o. male with history of psoriatic arthritis. Patient remains on taltz 80 mg sq injections every month.  He continues to rate Taltz without any side effects or injection site reactions.  He has not missed any doses recently.  He denies any recent or recurrent infections.  Patient reports for the past 3 weeks he has been experiencing increased pain, stiffness, numbness involving the right hand.  He denies any injury or overuse activities.  Patient states that his hand stiffness has been lasting 30 minutes to an hour daily.  He has had difficulty making a complete fist due to the severity of stiffness.  He states that his hand is completely numb first thing in the morning and improves throughout the day.  He denies any numbness while driving or any nocturnal symptoms.  Patient states he has noticed some swelling in the left hand.  [name] having tenderness involving the right wrist and several joints in his right hand.  He has tried taking tylenol PRN for pain relief.  He denies any other joint pain or joint swelling at this time.  He denies any Achilles tendinitis or plantar fasciitis.  He denies any SI joint discomfort.  He denies any active psoriasis.   Activities of Daily Living:  Patient reports morning stiffness for 1 hour.   Patient Denies nocturnal pain.  Difficulty dressing/grooming: Denies Difficulty climbing stairs: Denies Difficulty getting out of chair: Denies Difficulty using hands for taps, buttons, cutlery, and/or writing: Denies  Review of Systems  Constitutional:  Negative for fatigue.  HENT:  Negative for mouth sores and mouth dryness.   Eyes:  Negative  for dryness.  Respiratory:  Negative for shortness of breath.   Cardiovascular:  Negative for chest pain and palpitations.  Gastrointestinal:  Negative for blood in stool, constipation and diarrhea.  Endocrine: Negative for increased urination.  Genitourinary:  Negative for involuntary urination.  Musculoskeletal:  Positive for joint pain, joint pain and morning stiffness. Negative for gait problem, joint swelling, myalgias, muscle weakness, muscle tenderness and myalgias.  Skin:  Negative for color change, rash and sensitivity to sunlight.  Allergic/Immunologic: Negative for susceptible to infections.  Neurological:  Positive for numbness. Negative for dizziness and headaches.  Hematological:  Negative for swollen glands.  Psychiatric/Behavioral:  Negative for depressed mood and sleep disturbance. The patient is not nervous/anxious.     PMFS History:  Patient Active Problem List   Diagnosis Date Noted   Acute medial meniscus tear of right knee    Elbow dislocation, right, initial encounter 03/06/2020   Closed disp fx of right olecranon with intraartic exten w malunion 02/14/2020   Morbid obesity with BMI of 45.0-49.9, adult (HCC) 11/25/2019   Hair loss 12/31/2017   Varicose veins of both lower extremities 07/26/2017   Iron deficiency anemia 09/21/2014   Psoriatic arthritis (HCC) 09/21/2014   ESSENTIAL HYPERTENSION, MALIGNANT 02/18/2009   Nonspecific (abnormal) findings on radiological and other examination of body structure 02/16/2009   CHEST XRAY, ABNORMAL 02/16/2009    Past Medical History:  Diagnosis Date   Acute sinus infection 11/07/2014   Arthritis    Blood transfusion without reported diagnosis    At [facility]    Difficult intubation 01/13/2020   difficult airway in OR (per Dr Rollene Fare)   Iron deficiency anemia 09/21/2014   Psoriasis    Psoriatic arthritis (HCC)    Psoriatic arthritis (HCC)    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 Antoine Poche 02/2009 with echo, as needed f/u   Torn meniscus    right knee    Family History  Problem Relation Age of Onset   Pulmonary fibrosis Mother    Hypertension Mother    Pancreatic cancer Maternal Grandmother    Rheum arthritis Paternal Aunt    Cancer Paternal Grandmother    Breast cancer Paternal Grandmother    COPD Paternal Grandfather    CAD Paternal Grandfather    Colon cancer Neg Hx    Prostate cancer Neg Hx    Diabetes Neg Hx    Thyroid disease Neg Hx    Liver cancer Neg Hx    Stomach cancer Neg Hx    Rectal cancer Neg Hx    Past Surgical History:  Procedure Laterality Date   COLONOSCOPY WITH ESOPHAGOGASTRODUODENOSCOPY (EGD)     ENDOVENOUS ABLATION SAPHENOUS VEIN W/ LASER  10/16/2022   endovenous laser ablation right greater saphenous vein by Coral Else MD   EXTERNAL FIXATION REMOVAL Right 03/22/2020   Procedure: REMOVAL EXTERNAL FIXATION ARM;  Surgeon: Roby Lofts, MD;  Location: [facility];  Service: Orthopedics;  Laterality: Right;   HERNIA REPAIR  1994   umbicial   INNER EAR SURGERY Left    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, 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   KNEE ARTHROSCOPY Right 05/10/2021   Procedure: ARTHROSCOPY KNEE partial medial meniscectomy;  Surgeon: Cammy Copa, MD;  Location: [facility];  Service: Orthopedics;  Laterality: Right;   LIGAMENT REPAIR Right 01/13/2020   Procedure: COLLATERAL LIGAMENT REPAIRS;  Surgeon: Mack Hook, MD;  Location: [facility];  Service: Orthopedics;  Laterality: Right;   ORIF ELBOW FRACTURE Right 02/17/2020   Procedure: REVISION OPEN REDUCTION INTERNAL FIXATION (ORIF) ELBOW/OLECRANON FRACTURE;  Surgeon: Roby Lofts, MD;  Location: [facility];  Service: Orthopedics;  Laterality: Right;   ORIF ULNAR FRACTURE Right 01/13/2020   Procedure: OPEN TREATMENT OF COMPLEX RIGHT PROXIMAL ULNA FRACTURE;  Surgeon: Mack Hook, MD;   Location: [facility];  Service: Orthopedics;  Laterality: Right;  PROCEDURE: OPEN TREATMENT OF COMPLEX RIGHT PROXIMAL ULNA FRACTURE WITH POSSIBLE COLLATERAL LIGAMENT REPAIRS LENGTH OF SURGERY: 2.5 HOURSMAC + REGIONAL BLOCK   Social History   Social History Narrative   Right handed   Caffeien use:1 soda per day   Immunization History  Administered Date(s) Administered   Influenza,inj,Quad PF,6+ Mos 11/20/2021   MMR 02/12/2001   PFIZER Comirnaty(Gray Top)Covid-19 Tri-Sucrose Vaccine 09/21/2020, 10/12/2020   PNEUMOCOCCAL CONJUGATE-20 11/20/2021   Pneumococcal Polysaccharide-23 11/10/2012   Td 08/17/2001     Objective: Vital Signs: BP 135/88 (BP Location: Left Arm, Patient Position: Sitting, Cuff Size: Large)   Pulse 94   Resp 17   Ht 5\' 10"  (1.778 m)   Wt (!) 305 lb 12.8 oz (138.7 kg)   BMI 43.88 kg/m    Physical Exam Vitals and nursing note reviewed.  Constitutional:      Appearance: [name] well-developed.  HENT:  Head: Normocephalic and atraumatic.  Eyes:     Conjunctiva/sclera: Conjunctivae normal.     Pupils: Pupils are equal, round, and reactive to light.  Cardiovascular:     Rate and Rhythm: Normal rate and regular rhythm.     Heart sounds: Normal heart sounds.  Pulmonary:     Effort: Pulmonary effort is normal.     Breath sounds: Normal breath sounds.  Abdominal:     General: Bowel sounds are normal.     Palpations: Abdomen is soft.  Musculoskeletal:     Cervical back: Normal range of motion and neck supple.  Skin:    General: Skin is warm and dry.     Capillary Refill: Capillary refill takes less than 2 seconds.  Neurological:     Mental Status: [name] alert and oriented to person, place, and time.  Psychiatric:        Behavior: Behavior normal.      Musculoskeletal Exam: C-spine, thoracic spine, lumbar spine have good range of motion.  No midline spinal tenderness or SI joint tenderness.  Shoulder joints have good range of motion.  Right  elbow flexion contracture.  Wrist joints have limited range of motion.  Flexion contracture of the left fifth PIP joint. Incomplete right fist formation.  Inflammation of the left third MCP joint.  Tenderness over the right wrist, right second, third and 5th MCP joint. Knee joints have good range of motion with no warmth or effusion.  Ankle joints have good range of motion with no tenderness or joint swelling.  CDAI Exam: CDAI Score: -- Patient Global: --; Provider Global: -- Swollen: 1 ; Tender: 5  Joint Exam 08/11/2023      Right  Left  Wrist   Tender     MCP 2   Tender     MCP 3   Tender  Swollen   MCP 5   Tender     PIP 2 (finger)   Tender        Investigation: No additional findings.  Imaging: No results found.  Recent Labs: Lab Results  Component Value Date   WBC 9.6 06/26/2023   HGB 15.0 06/26/2023   PLT 212 06/26/2023   NA 136 06/26/2023   K 3.8 06/26/2023   CL 101 06/26/2023   CO2 24 06/26/2023   GLUCOSE 108 (H) 06/26/2023   BUN 11 06/26/2023   CREATININE 0.95 06/26/2023   BILITOT 0.6 06/26/2023   ALKPHOS 128 (H) 04/14/2022   AST 15 06/26/2023   ALT 16 06/26/2023   PROT 7.7 06/26/2023   ALBUMIN 4.2 04/14/2022   CALCIUM 9.0 06/26/2023   GFRAA 112 04/19/2021   QFTBGOLD Negative 04/03/2017   QFTBGOLDPLUS NEGATIVE 12/23/2022    Speciality Comments: Prior therapy: Enbrel/Humira (inadequate response)  and Remicaide (d/c due to cost and time consuming infusion), Cosentyx stopped 04/19/21. Talt started 05/27/21  FU q 3 months  Procedures:  No procedures performed Allergies: Sulfonamide derivatives and Bee venom    Assessment / Plan:     Visit Diagnoses: Psoriatic arthritis [facility]): Patient presents today with increased pain and stiffness involving the right hand.  His morning stiffness has been lasting 30 to 60 minutes daily.  On examination he has tenderness involving the right wrist and right second, third, and fifth MCP joints.  Inflammation noted in the left  third MCP joint today.  He has had difficulty making a complete fist involving the right hand for the past 3 weeks.  He remains on Taltz 80 mg  sq injections every 28 days.  He continues to tolerate Taltz without any side effects or injection site reactions.  He has not had any interruptions in therapy recently.  He has not been performing any overuse or repetitive activities.  He has no active psoriasis at this time.  No SI joint tenderness upon palpation.  No evidence of Achilles tendinitis or plantar fasciitis.  Plan to prescribe a prednisone taper to alleviate his symptoms.  If he continues to have paresthesias we will proceed with an NCV with EMG for further evaluation. A prednisone taper starting at 20 mg tapering by 5 mg every 4 days was sent to the pharmacy.  He will remain on taltz as prescribed.  He was advised to notify us if his symptoms persist or worsen.  He will follow up in 3 months or sooner if needed.   Psoriasis: No active psoriasis at this time.   High risk medication use - Taltz 80 mg sq injections every 28 days. Previously had inadequate response to Enbrel, Humira, cosentyx and Remicade. CBC and CMP updated on 06/26/23. His next lab work will be due in November and every 3 months.  Standing orders for CBC and CMP remain in place. TB gold negative 12/23/22.  Discussed the importance of holding taltz if he develops signs or symptoms of an infection and to resume once the infection has completely cleared.   Primary osteoarthritis of both hands: Patient presents today with increased pain, stiffness, and numbness involving the right hand.  His symptoms started 3 weeks ago with no identifiable trigger.  He has not been performing any repetitive or overuse activities.  No recent injury.  In the mornings he has been unable to make a complete fist.  His stiffness has been lasting 30 to 60 minutes daily.  His numbness occurs first thing in the mornings and is not reproducible.  He has not had any  numbness while driving or at night. On examination he has tenderness involving the right wrist and several MCP joints.  Different treatment options were discussed.  Plan on prescribing a prednisone taper to alleviate his current tenderness and inflammation.  If his symptoms persist or worsen or he has ongoing paresthesias we will proceed with NCV with EMG for further evaluation.  Primary osteoarthritis of left knee: He has good ROM of the left knee with no warmth or effusion.  Tendinopathy of right shoulder: Good ROM of the right shoulder with no discomfort at this time.   History of torn meniscus of right knee: No mechanical symptoms at this time.   Chondromalacia patellae of right knee: No warmth or effusion noted.   Closed fracture of proximal end of right ulna with routine healing, unspecified fracture morphology, subsequent encounter - Previous fracture of the right proximal ulna on 12/15/2019 and underwent internal fixation by Dr. Janee Morn on 01/13/2020.  Other medical conditions are listed as follows:  History of hypertension: Blood pressure was 135/88 today in the office.  History of iron deficiency anemia  Varicose veins of both lower extremities, unspecified whether complicated  Orders: No orders of the defined types were placed in this encounter.  Meds ordered this encounter  Medications   predniSONE (DELTASONE) 5 MG tablet    Sig: Take 4 tablets by mouth daily x 4 days, 3 tablets daily x 4 days, 2 tablets daily x 4 days, 1 tablet daily x 4 days    Dispense:  40 tablet    Refill:  0  Follow-Up Instructions: Return in about 3 months (around 11/11/2023) for Psoriatic arthritis.   Gearldine Bienenstock, PA-C  Note - This record has been created using Dragon software.  Chart creation errors have been sought, but may not always  have been located. Such creation errors do not reflect on  the standard of medical care.

## 2023-08-11 ENCOUNTER — Ambulatory Visit: Payer: BC Managed Care – PPO | Attending: Rheumatology | Admitting: Physician Assistant

## 2023-08-11 ENCOUNTER — Encounter: Payer: Self-pay | Admitting: Physician Assistant

## 2023-08-11 VITALS — BP 135/88 | HR 94 | Resp 17 | Ht 70.0 in | Wt 305.8 lb

## 2023-08-11 DIAGNOSIS — M19041 Primary osteoarthritis, right hand: Secondary | ICD-10-CM

## 2023-08-11 DIAGNOSIS — Z8679 Personal history of other diseases of the circulatory system: Secondary | ICD-10-CM

## 2023-08-11 DIAGNOSIS — M1712 Unilateral primary osteoarthritis, left knee: Secondary | ICD-10-CM

## 2023-08-11 DIAGNOSIS — Z79899 Other long term (current) drug therapy: Secondary | ICD-10-CM

## 2023-08-11 DIAGNOSIS — L409 Psoriasis, unspecified: Secondary | ICD-10-CM | POA: Diagnosis not present

## 2023-08-11 DIAGNOSIS — Z87828 Personal history of other (healed) physical injury and trauma: Secondary | ICD-10-CM

## 2023-08-11 DIAGNOSIS — I8393 Asymptomatic varicose veins of bilateral lower extremities: Secondary | ICD-10-CM

## 2023-08-11 DIAGNOSIS — L4050 Arthropathic psoriasis, unspecified: Secondary | ICD-10-CM

## 2023-08-11 DIAGNOSIS — M2241 Chondromalacia patellae, right knee: Secondary | ICD-10-CM

## 2023-08-11 DIAGNOSIS — M19042 Primary osteoarthritis, left hand: Secondary | ICD-10-CM

## 2023-08-11 DIAGNOSIS — S52001D Unspecified fracture of upper end of right ulna, subsequent encounter for closed fracture with routine healing: Secondary | ICD-10-CM

## 2023-08-11 DIAGNOSIS — Z862 Personal history of diseases of the blood and blood-forming organs and certain disorders involving the immune mechanism: Secondary | ICD-10-CM

## 2023-08-11 DIAGNOSIS — M67911 Unspecified disorder of synovium and tendon, right shoulder: Secondary | ICD-10-CM

## 2023-08-11 MED ORDER — PREDNISONE 5 MG PO TABS: ORAL_TABLET | ORAL | 0 refills | Status: DC

## 2023-08-11 NOTE — Patient Instructions (Addendum)
 Standing Labs We placed an order today for your standing lab work.   Please have your standing labs drawn in mid-November and every 3 months   Please have your labs drawn 2 weeks prior to your appointment so that the provider can discuss your lab results at your appointment, if possible.  Please note that you may see your imaging and lab results in MyChart before we have reviewed them. We will contact you once all results are reviewed. Please allow our office up to 72 hours to thoroughly review all of the results before contacting the office for clarification of your results.  WALK-IN LAB HOURS  Monday through Thursday from 8:00 am -12:30 pm and 1:00 pm-5:00 pm and Friday from 8:00 am-12:00 pm.  Patients with office visits requiring labs will be seen before walk-in labs.  You may encounter longer than normal wait times. Please allow additional time. Wait times may be shorter on  Monday and Thursday afternoons.  We do not book appointments for walk-in labs. We appreciate your patience and understanding with our staff.   Labs are drawn by [facility]. Please bring your co-pay at the time of your lab draw.  You may receive a bill from [facility] for your lab work.  Please note if you are on Hydroxychloroquine and and an order has been placed for a Hydroxychloroquine level,  you will need to have it drawn 4 hours or more after your last dose.  If you wish to have your labs drawn at another location, please call the office 24 hours in advance so we can fax the orders.  The office is located at [address], [address], [place], [place] [address]   If you have any questions regarding directions or hours of operation,  please call [phone].   As a reminder, please drink plenty of water prior to coming for your lab work. Thanks!

## 2023-08-17 ENCOUNTER — Ambulatory Visit: Payer: BC Managed Care – PPO | Admitting: Physician Assistant

## 2023-08-17 DIAGNOSIS — M19042 Primary osteoarthritis, left hand: Secondary | ICD-10-CM

## 2023-08-17 DIAGNOSIS — S52001D Unspecified fracture of upper end of right ulna, subsequent encounter for closed fracture with routine healing: Secondary | ICD-10-CM

## 2023-08-17 DIAGNOSIS — Z87828 Personal history of other (healed) physical injury and trauma: Secondary | ICD-10-CM

## 2023-08-17 DIAGNOSIS — L4050 Arthropathic psoriasis, unspecified: Secondary | ICD-10-CM

## 2023-08-17 DIAGNOSIS — L409 Psoriasis, unspecified: Secondary | ICD-10-CM

## 2023-08-17 DIAGNOSIS — Z862 Personal history of diseases of the blood and blood-forming organs and certain disorders involving the immune mechanism: Secondary | ICD-10-CM

## 2023-08-17 DIAGNOSIS — Z79899 Other long term (current) drug therapy: Secondary | ICD-10-CM

## 2023-08-17 DIAGNOSIS — M2241 Chondromalacia patellae, right knee: Secondary | ICD-10-CM

## 2023-08-17 DIAGNOSIS — M67911 Unspecified disorder of synovium and tendon, right shoulder: Secondary | ICD-10-CM

## 2023-08-17 DIAGNOSIS — Z8679 Personal history of other diseases of the circulatory system: Secondary | ICD-10-CM

## 2023-08-17 DIAGNOSIS — M1712 Unilateral primary osteoarthritis, left knee: Secondary | ICD-10-CM

## 2023-08-17 DIAGNOSIS — I8393 Asymptomatic varicose veins of bilateral lower extremities: Secondary | ICD-10-CM

## 2023-08-19 ENCOUNTER — Ambulatory Visit: Payer: BC Managed Care – PPO | Admitting: Rheumatology

## 2023-08-20 ENCOUNTER — Other Ambulatory Visit: Payer: Self-pay

## 2023-08-24 ENCOUNTER — Other Ambulatory Visit (HOSPITAL_COMMUNITY): Payer: Self-pay

## 2023-08-24 NOTE — Progress Notes (Signed)
Specialty Pharmacy Refill Coordination Note  Joseph [name] is a 42 y.o. male contacted today regarding refills of specialty medication(s) Ixekizumab   Patient requested Delivery   Delivery date: 08/27/23   Verified address: [address] [facility] [place] [address]   Medication will be filled on 08/26/23.

## 2023-08-24 NOTE — Progress Notes (Signed)
Specialty Pharmacy Ongoing Clinical Assessment Note  Joseph [name] is a 42 y.o. male who is being followed by the specialty pharmacy service for RxSp Psoriatic Arthritis   Patient's specialty medication(s) reviewed today: Ixekizumab   Missed doses in the last 4 weeks: 0   Patient/Caregiver did not have any additional questions or concerns.   Therapeutic benefit summary: Patient is achieving benefit   Adverse events/side effects summary: No adverse events/side effects   Patient's therapy is appropriate to: Continue    Goals Addressed             This Visit's Progress    Minimize recurrence of flares       Patient is not on track and improving. Patient will maintain adherence.  Sherron Ales, PA is monitoring and determining if a change in therapy is warranted.          Follow up:  6 months  Servando Snare Specialty Pharmacist

## 2023-09-07 NOTE — Progress Notes (Deleted)
Office Visit Note  Patient: Joseph [name]             Date of Birth: [birth year]           MRN: [id number]             PCP: Dani Gobble, PA-C Referring: Fransisca Kaufmann* Visit Date: 09/11/2023 Occupation: @GUAROCC @  Subjective:  No chief complaint on file.   History of Present Illness: Joseph [name] is a 42 y.o. male ***     Activities of Daily Living:  Patient reports morning stiffness for *** {minute/hour:19697}.   Patient {ACTIONS;DENIES/REPORTS:21021675::"Denies"} nocturnal pain.  Difficulty dressing/grooming: {ACTIONS;DENIES/REPORTS:21021675::"Denies"} Difficulty climbing stairs: {ACTIONS;DENIES/REPORTS:21021675::"Denies"} Difficulty getting out of chair: {ACTIONS;DENIES/REPORTS:21021675::"Denies"} Difficulty using hands for taps, buttons, cutlery, and/or writing: {ACTIONS;DENIES/REPORTS:21021675::"Denies"}  No Rheumatology ROS completed.   PMFS History:  Patient Active Problem List   Diagnosis Date Noted   Acute medial meniscus tear of right knee    Elbow dislocation, right, initial encounter 03/06/2020   Closed disp fx of right olecranon with intraartic exten w malunion 02/14/2020   Morbid obesity with BMI of 45.0-49.9, adult (HCC) 11/25/2019   Hair loss 12/31/2017   Varicose veins of both lower extremities 07/26/2017   Iron deficiency anemia 09/21/2014   Psoriatic arthritis (HCC) 09/21/2014   ESSENTIAL HYPERTENSION, MALIGNANT 02/18/2009   Nonspecific (abnormal) findings on radiological and other examination of body structure 02/16/2009   CHEST XRAY, ABNORMAL 02/16/2009    Past Medical History:  Diagnosis Date   Acute sinus infection 11/07/2014   Arthritis    Blood transfusion without reported diagnosis    At [facility]    Difficult intubation 01/13/2020   difficult airway in OR (per Dr Rollene Fare)   Iron deficiency anemia 09/21/2014   Psoriasis    Psoriatic arthritis (HCC)    Psoriatic arthritis (HCC)    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 Antoine Poche 02/2009 with echo, as needed f/u   Torn meniscus    right knee    Family History  Problem Relation Age of Onset   Pulmonary fibrosis Mother    Hypertension Mother    Pancreatic cancer Maternal Grandmother    Rheum arthritis Paternal Aunt    Cancer Paternal Grandmother    Breast cancer Paternal Grandmother    COPD Paternal Grandfather    CAD Paternal Grandfather    Colon cancer Neg Hx    Prostate cancer Neg Hx    Diabetes Neg Hx    Thyroid disease Neg Hx    Liver cancer Neg Hx    Stomach cancer Neg Hx    Rectal cancer Neg Hx    Past Surgical History:  Procedure Laterality Date   COLONOSCOPY WITH ESOPHAGOGASTRODUODENOSCOPY (EGD)     ENDOVENOUS ABLATION SAPHENOUS VEIN W/ LASER  10/16/2022   endovenous laser ablation right greater saphenous vein by Coral Else MD   EXTERNAL FIXATION REMOVAL Right 03/22/2020   Procedure: REMOVAL EXTERNAL FIXATION ARM;  Surgeon: Roby Lofts, MD;  Location: [facility];  Service: Orthopedics;  Laterality: Right;   HERNIA REPAIR  1994   umbicial   INNER EAR SURGERY Left    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, 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   KNEE ARTHROSCOPY Right 05/10/2021   Procedure: ARTHROSCOPY KNEE partial medial meniscectomy;  Surgeon: Cammy Copa, MD;  Location: [facility];  Service: Orthopedics;  Laterality: Right;   LIGAMENT  REPAIR Right 01/13/2020   Procedure: COLLATERAL LIGAMENT REPAIRS;  Surgeon: Mack Hook, MD;  Location: [facility];  Service: Orthopedics;  Laterality: Right;   ORIF ELBOW FRACTURE Right 02/17/2020   Procedure: REVISION OPEN REDUCTION INTERNAL FIXATION (ORIF) ELBOW/OLECRANON FRACTURE;  Surgeon: Roby Lofts, MD;  Location: [facility];  Service: Orthopedics;  Laterality: Right;   ORIF ULNAR FRACTURE Right 01/13/2020   Procedure: OPEN TREATMENT OF COMPLEX RIGHT PROXIMAL ULNA  FRACTURE;  Surgeon: Mack Hook, MD;  Location:  SURGERY CENTER;  Service: Orthopedics;  Laterality: Right;  PROCEDURE: OPEN TREATMENT OF COMPLEX RIGHT PROXIMAL ULNA FRACTURE WITH POSSIBLE COLLATERAL LIGAMENT REPAIRS LENGTH OF SURGERY: 2.5 HOURSMAC + REGIONAL BLOCK   Social History   Social History Narrative   Right handed   Caffeien use:1 soda per day   Immunization History  Administered Date(s) Administered   Influenza,inj,Quad PF,6+ Mos 11/20/2021   MMR 02/12/2001   PFIZER Comirnaty(Gray Top)Covid-19 Tri-Sucrose Vaccine 09/21/2020, 10/12/2020   PNEUMOCOCCAL CONJUGATE-20 11/20/2021   Pneumococcal Polysaccharide-23 11/10/2012   Td 08/17/2001     Objective: Vital Signs: There were no vitals taken for this visit.   Physical Exam   Musculoskeletal Exam: ***  CDAI Exam: CDAI Score: -- Patient Global: --; Provider Global: -- Swollen: --; Tender: -- Joint Exam 09/11/2023   No joint exam has been documented for this visit   There is currently no information documented on the homunculus. Go to the Rheumatology activity and complete the homunculus joint exam.  Investigation: No additional findings.  Imaging: No results found.  Recent Labs: Lab Results  Component Value Date   WBC 9.6 06/26/2023   HGB 15.0 06/26/2023   PLT 212 06/26/2023   NA 136 06/26/2023   K 3.8 06/26/2023   CL 101 06/26/2023   CO2 24 06/26/2023   GLUCOSE 108 (H) 06/26/2023   BUN 11 06/26/2023   CREATININE 0.95 06/26/2023   BILITOT 0.6 06/26/2023   ALKPHOS 128 (H) 04/14/2022   AST 15 06/26/2023   ALT 16 06/26/2023   PROT 7.7 06/26/2023   ALBUMIN 4.2 04/14/2022   CALCIUM 9.0 06/26/2023   GFRAA 112 04/19/2021   QFTBGOLD Negative 04/03/2017   QFTBGOLDPLUS NEGATIVE 12/23/2022    Speciality Comments: Prior therapy: Enbrel/Humira (inadequate response)  and Remicaide (d/c due to cost and time consuming infusion), Cosentyx stopped 04/19/21. Talt started 05/27/21  FU q 3  months  Procedures:  No procedures performed Allergies: Sulfonamide derivatives and Bee venom   Assessment / Plan:     Visit Diagnoses: No diagnosis found.  Orders: No orders of the defined types were placed in this encounter.  No orders of the defined types were placed in this encounter.   Face-to-face time spent with patient was *** minutes. Greater than 50% of time was spent in counseling and coordination of care.  Follow-Up Instructions: No follow-ups on file.   Ellen Henri, CMA  Note - This record has been created using Animal nutritionist.  Chart creation errors have been sought, but may not always  have been located. Such creation errors do not reflect on  the standard of medical care.

## 2023-09-07 NOTE — Telephone Encounter (Signed)
Please schedule sooner visit with Dr. Corliss Skains for further evaluation

## 2023-09-09 ENCOUNTER — Other Ambulatory Visit: Payer: Self-pay | Admitting: Rheumatology

## 2023-09-09 DIAGNOSIS — M19041 Primary osteoarthritis, right hand: Secondary | ICD-10-CM

## 2023-09-09 MED ORDER — GABAPENTIN 100 MG PO CAPS
200.0000 mg | ORAL_CAPSULE | Freq: Every day | ORAL | 0 refills | Status: DC
Start: 2023-09-09 — End: 2023-10-05

## 2023-09-09 NOTE — Telephone Encounter (Signed)
We will have to schedule nerve conduction velocity and EMG as discussed by Ladona Ridgel during the last office visit.  In the meantime we can give him gabapentin 100 mg tablet, 2 tablets p.o. at bedtime.  Gabapentin can cause dizziness, drowsiness and weight gain with prolonged use.  Please give him 30-day supply.  Patient should call us to notify if gabapentin is helpful so we can get further refills if needed.

## 2023-09-09 NOTE — Telephone Encounter (Signed)
Attempted to contact the patient and left message for patient to call the office.  

## 2023-09-09 NOTE — Telephone Encounter (Signed)
Pt called stating he can not come in Friday to see Dr. Corliss Skains about his hand pain. Pt would like to know if he could see Joseph [name] instead at a different date about this pain.

## 2023-09-09 NOTE — Telephone Encounter (Signed)
Patient advised we will have to schedule nerve conduction velocity and EMG as discussed by Ladona Ridgel during the last office visit.  In the meantime we can give him gabapentin 100 mg tablet, 2 tablets p.o. at bedtime.  Gabapentin can cause dizziness, drowsiness and weight gain with prolonged use.  Please give him 30-day supply.  Patient should call us to notify if gabapentin is helpful so we can get further refills if needed. Patient expressed understanding. Referral placed for Nerve Conduction Velocity and EMG.

## 2023-09-09 NOTE — Telephone Encounter (Signed)
Received a message from patient's wife, Joseph [name], stating that she was calling on behalf of Joseph [name].   Returned call to Joseph [name] and advised Joseph [name] needs to specifically needs to see Dr. Corliss Skains. Advised her that Dr. Corliss Skains does not have any other appointments available at this time. Joseph [name] asked if there was anything Joseph [name] can do for the pain in his hands until he can get into the office for an appointment. Patient is on Taltz every 28 days and recently was given a prednisone taper on 08/11/2023. Patient stated in a recent my chart message he is having issue with right hand. He advised he is having the tingling sensation and the locking up in the morning. It is giving me pain also in my knuckles. Please advise.

## 2023-09-11 ENCOUNTER — Ambulatory Visit: Payer: BC Managed Care – PPO | Admitting: Rheumatology

## 2023-09-11 ENCOUNTER — Other Ambulatory Visit: Payer: Self-pay

## 2023-09-11 DIAGNOSIS — Z862 Personal history of diseases of the blood and blood-forming organs and certain disorders involving the immune mechanism: Secondary | ICD-10-CM

## 2023-09-11 DIAGNOSIS — Z79899 Other long term (current) drug therapy: Secondary | ICD-10-CM

## 2023-09-11 DIAGNOSIS — Z87828 Personal history of other (healed) physical injury and trauma: Secondary | ICD-10-CM

## 2023-09-11 DIAGNOSIS — M2241 Chondromalacia patellae, right knee: Secondary | ICD-10-CM

## 2023-09-11 DIAGNOSIS — I8393 Asymptomatic varicose veins of bilateral lower extremities: Secondary | ICD-10-CM

## 2023-09-11 DIAGNOSIS — Z8679 Personal history of other diseases of the circulatory system: Secondary | ICD-10-CM

## 2023-09-11 DIAGNOSIS — M19041 Primary osteoarthritis, right hand: Secondary | ICD-10-CM

## 2023-09-11 DIAGNOSIS — S52001D Unspecified fracture of upper end of right ulna, subsequent encounter for closed fracture with routine healing: Secondary | ICD-10-CM

## 2023-09-11 DIAGNOSIS — L409 Psoriasis, unspecified: Secondary | ICD-10-CM

## 2023-09-11 DIAGNOSIS — M67911 Unspecified disorder of synovium and tendon, right shoulder: Secondary | ICD-10-CM

## 2023-09-11 DIAGNOSIS — M1712 Unilateral primary osteoarthritis, left knee: Secondary | ICD-10-CM

## 2023-09-11 DIAGNOSIS — L4050 Arthropathic psoriasis, unspecified: Secondary | ICD-10-CM

## 2023-09-14 ENCOUNTER — Other Ambulatory Visit: Payer: Self-pay

## 2023-09-14 ENCOUNTER — Other Ambulatory Visit (HOSPITAL_COMMUNITY): Payer: Self-pay | Admitting: Pharmacy Technician

## 2023-09-14 ENCOUNTER — Other Ambulatory Visit (HOSPITAL_COMMUNITY): Payer: Self-pay

## 2023-09-14 NOTE — Progress Notes (Signed)
Specialty Pharmacy Refill Coordination Note  Joseph [name] is a 42 y.o. male contacted today regarding refills of specialty medication(s) Ixekizumab   Patient requested Delivery   Delivery date: 09/23/23   Verified address: [address] [place] [address]   Medication will be filled on 09/22/23.

## 2023-09-17 ENCOUNTER — Telehealth: Payer: Self-pay | Admitting: Physical Medicine and Rehabilitation

## 2023-09-17 NOTE — Telephone Encounter (Signed)
Patient wife called returning a call to set up the appointment. CB#[phone]

## 2023-10-02 ENCOUNTER — Encounter: Payer: BC Managed Care – PPO | Admitting: Physical Medicine and Rehabilitation

## 2023-10-03 DIAGNOSIS — M79641 Pain in right hand: Secondary | ICD-10-CM | POA: Diagnosis not present

## 2023-10-03 DIAGNOSIS — L4050 Arthropathic psoriasis, unspecified: Secondary | ICD-10-CM | POA: Diagnosis not present

## 2023-10-05 ENCOUNTER — Other Ambulatory Visit: Payer: Self-pay | Admitting: Rheumatology

## 2023-10-05 DIAGNOSIS — M19042 Primary osteoarthritis, left hand: Secondary | ICD-10-CM

## 2023-10-05 NOTE — Telephone Encounter (Signed)
Last Fill: 09/09/2023  Next Visit: 11/12/2023  Last Visit: 08/11/2023  Dx: Primary osteoarthritis of both hands   Current Dose per phone note on 09/09/2023: gabapentin 100 mg tablet, 2 tablets p.o. at bedtime.   Okay to refill Gabapentin?

## 2023-10-15 ENCOUNTER — Other Ambulatory Visit: Payer: Self-pay | Admitting: Physician Assistant

## 2023-10-15 ENCOUNTER — Other Ambulatory Visit: Payer: Self-pay

## 2023-10-15 DIAGNOSIS — L4050 Arthropathic psoriasis, unspecified: Secondary | ICD-10-CM

## 2023-10-15 DIAGNOSIS — L409 Psoriasis, unspecified: Secondary | ICD-10-CM

## 2023-10-15 MED ORDER — TALTZ 80 MG/ML ~~LOC~~ SOAJ
80.0000 mg | SUBCUTANEOUS | 0 refills | Status: DC
  Filled 2023-10-16 (×2): qty 1, 28d supply, fill #0

## 2023-10-15 NOTE — Telephone Encounter (Signed)
Last Fill: 07/23/2023  Labs: 06/26/2023 CBC WNL. Glucose is 108. Globulin remains elevated but has improved. Rest of CMP WNL.  TB Gold: 12/23/2022 Neg    Next Visit: 11/13/2023  Last Visit: 08/11/2023  [id number] arthritis   Current Dose per office note 08/11/2023: Taltz 80 mg sq injections every 28 days   Spoke with patient's wife to advise he is due to update labs. She states she will have him come to update them. Also advised her that patient is unable to take his Altamease Oiler a week early as this medication has to be taken every 28 days.   Okay to refill Taltz?

## 2023-10-16 ENCOUNTER — Other Ambulatory Visit (HOSPITAL_COMMUNITY): Payer: Self-pay

## 2023-10-16 ENCOUNTER — Other Ambulatory Visit (HOSPITAL_COMMUNITY): Payer: Self-pay | Admitting: Pharmacy Technician

## 2023-10-16 ENCOUNTER — Other Ambulatory Visit: Payer: Self-pay

## 2023-10-16 NOTE — Progress Notes (Signed)
Specialty Pharmacy Refill Coordination Note  Joseph [name] is a 42 y.o. male contacted today regarding refills of specialty medication(s) Ixekizumab     Patient requested Delivery   Delivery date: 10/22/23   Verified address: [address] [place] [address]   Medication will be filled on 10/21/23.

## 2023-10-21 ENCOUNTER — Other Ambulatory Visit: Payer: Self-pay

## 2023-10-28 ENCOUNTER — Telehealth: Payer: Self-pay | Admitting: Physical Medicine and Rehabilitation

## 2023-10-28 NOTE — Telephone Encounter (Signed)
Patient's wife called needing to R/S patient's appointment. The number to contact Shanda Bumps is [phone]

## 2023-10-30 ENCOUNTER — Encounter: Payer: BC Managed Care – PPO | Admitting: Physical Medicine and Rehabilitation

## 2023-10-30 NOTE — Progress Notes (Deleted)
Office Visit Note  Patient: Joseph [name]             Date of Birth: [birth year]           MRN: [id number]             PCP: Dani Gobble, PA-C Referring: Fransisca Kaufmann* Visit Date: 11/13/2023 Occupation: @GUAROCC @  Subjective:  No chief complaint on file.   History of Present Illness: Joseph [name] is a 42 y.o. male ***     Activities of Daily Living:  Patient reports morning stiffness for *** {minute/hour:19697}.   Patient {ACTIONS;DENIES/REPORTS:21021675::"Denies"} nocturnal pain.  Difficulty dressing/grooming: {ACTIONS;DENIES/REPORTS:21021675::"Denies"} Difficulty climbing stairs: {ACTIONS;DENIES/REPORTS:21021675::"Denies"} Difficulty getting out of chair: {ACTIONS;DENIES/REPORTS:21021675::"Denies"} Difficulty using hands for taps, buttons, cutlery, and/or writing: {ACTIONS;DENIES/REPORTS:21021675::"Denies"}  No Rheumatology ROS completed.   PMFS History:  Patient Active Problem List   Diagnosis Date Noted   Acute medial meniscus tear of right knee    Elbow dislocation, right, initial encounter 03/06/2020   Closed disp fx of right olecranon with intraartic exten w malunion 02/14/2020   Morbid obesity with BMI of 45.0-49.9, adult (HCC) 11/25/2019   Hair loss 12/31/2017   Varicose veins of both lower extremities 07/26/2017   Iron deficiency anemia 09/21/2014   Psoriatic arthritis (HCC) 09/21/2014   ESSENTIAL HYPERTENSION, MALIGNANT 02/18/2009   Nonspecific (abnormal) findings on radiological and other examination of body structure 02/16/2009   CHEST XRAY, ABNORMAL 02/16/2009    Past Medical History:  Diagnosis Date   Acute sinus infection 11/07/2014   Arthritis    Blood transfusion without reported diagnosis    At [facility]    Difficult intubation 01/13/2020   difficult airway in OR (per Dr Rollene Fare)   Iron deficiency anemia 09/21/2014   Psoriasis    Psoriatic arthritis (HCC)    Psoriatic arthritis (HCC)    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 Antoine Poche 02/2009 with echo, as needed f/u   Torn meniscus    right knee    Family History  Problem Relation Age of Onset   Pulmonary fibrosis Mother    Hypertension Mother    Pancreatic cancer Maternal Grandmother    Rheum arthritis Paternal Aunt    Cancer Paternal Grandmother    Breast cancer Paternal Grandmother    COPD Paternal Grandfather    CAD Paternal Grandfather    Colon cancer Neg Hx    Prostate cancer Neg Hx    Diabetes Neg Hx    Thyroid disease Neg Hx    Liver cancer Neg Hx    Stomach cancer Neg Hx    Rectal cancer Neg Hx    Past Surgical History:  Procedure Laterality Date   COLONOSCOPY WITH ESOPHAGOGASTRODUODENOSCOPY (EGD)     ENDOVENOUS ABLATION SAPHENOUS VEIN W/ LASER  10/16/2022   endovenous laser ablation right greater saphenous vein by Coral Else MD   EXTERNAL FIXATION REMOVAL Right 03/22/2020   Procedure: REMOVAL EXTERNAL FIXATION ARM;  Surgeon: Roby Lofts, MD;  Location: [facility];  Service: Orthopedics;  Laterality: Right;   HERNIA REPAIR  1994   umbicial   INNER EAR SURGERY Left    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, 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   KNEE ARTHROSCOPY Right 05/10/2021   Procedure: ARTHROSCOPY KNEE partial medial meniscectomy;  Surgeon: Cammy Copa, MD;  Location: [facility];  Service: Orthopedics;  Laterality: Right;   LIGAMENT  REPAIR Right 01/13/2020   Procedure: COLLATERAL LIGAMENT REPAIRS;  Surgeon: Mack Hook, MD;  Location: [facility];  Service: Orthopedics;  Laterality: Right;   ORIF ELBOW FRACTURE Right 02/17/2020   Procedure: REVISION OPEN REDUCTION INTERNAL FIXATION (ORIF) ELBOW/OLECRANON FRACTURE;  Surgeon: Roby Lofts, MD;  Location: [facility];  Service: Orthopedics;  Laterality: Right;   ORIF ULNAR FRACTURE Right 01/13/2020   Procedure: OPEN TREATMENT OF COMPLEX RIGHT PROXIMAL ULNA  FRACTURE;  Surgeon: Mack Hook, MD;  Location: [facility];  Service: Orthopedics;  Laterality: Right;  PROCEDURE: OPEN TREATMENT OF COMPLEX RIGHT PROXIMAL ULNA FRACTURE WITH POSSIBLE COLLATERAL LIGAMENT REPAIRS LENGTH OF SURGERY: 2.5 HOURSMAC + REGIONAL BLOCK   Social History   Social History Narrative   Right handed   Caffeien use:1 soda per day   Immunization History  Administered Date(s) Administered   Influenza,inj,Quad PF,6+ Mos 11/20/2021   MMR 02/12/2001   PFIZER Comirnaty(Gray Top)Covid-19 Tri-Sucrose Vaccine 09/21/2020, 10/12/2020   PNEUMOCOCCAL CONJUGATE-20 11/20/2021   Pneumococcal Polysaccharide-23 11/10/2012   Td 08/17/2001     Objective: Vital Signs: There were no vitals taken for this visit.   Physical Exam   Musculoskeletal Exam: ***  CDAI Exam: CDAI Score: -- Patient Global: --; Provider Global: -- Swollen: --; Tender: -- Joint Exam 11/13/2023   No joint exam has been documented for this visit   There is currently no information documented on the homunculus. Go to the Rheumatology activity and complete the homunculus joint exam.  Investigation: No additional findings.  Imaging: No results found.  Recent Labs: Lab Results  Component Value Date   WBC 9.6 06/26/2023   HGB 15.0 06/26/2023   PLT 212 06/26/2023   NA 136 06/26/2023   K 3.8 06/26/2023   CL 101 06/26/2023   CO2 24 06/26/2023   GLUCOSE 108 (H) 06/26/2023   BUN 11 06/26/2023   CREATININE 0.95 06/26/2023   BILITOT 0.6 06/26/2023   ALKPHOS 128 (H) 04/14/2022   AST 15 06/26/2023   ALT 16 06/26/2023   PROT 7.7 06/26/2023   ALBUMIN 4.2 04/14/2022   CALCIUM 9.0 06/26/2023   GFRAA 112 04/19/2021   QFTBGOLD Negative 04/03/2017   QFTBGOLDPLUS NEGATIVE 12/23/2022    Speciality Comments: Prior therapy: Enbrel/Humira (inadequate response)  and Remicaide (d/c due to cost and time consuming infusion), Cosentyx stopped 04/19/21. Talt started 05/27/21  FU q 3  months  Procedures:  No procedures performed Allergies: Sulfonamide derivatives and Bee venom   Assessment / Plan:     Visit Diagnoses: Psoriatic arthritis (HCC)  Psoriasis  High risk medication use  Varicose veins of both lower extremities, unspecified whether complicated  Primary osteoarthritis of both hands  Primary osteoarthritis of left knee  Tendinopathy of right shoulder  History of torn meniscus of right knee  Chondromalacia patellae of right knee  Closed fracture of proximal end of right ulna with routine healing, unspecified fracture morphology, subsequent encounter  History of hypertension  History of iron deficiency anemia  Orders: No orders of the defined types were placed in this encounter.  No orders of the defined types were placed in this encounter.   Face-to-face time spent with patient was *** minutes. Greater than 50% of time was spent in counseling and coordination of care.  Follow-Up Instructions: No follow-ups on file.   Gearldine Bienenstock, PA-C  Note - This record has been created using Dragon software.  Chart creation errors have been sought, but may not always  have been located. Such creation  errors do not reflect on  the standard of medical care.

## 2023-10-31 ENCOUNTER — Other Ambulatory Visit: Payer: Self-pay | Admitting: Physician Assistant

## 2023-10-31 DIAGNOSIS — M19041 Primary osteoarthritis, right hand: Secondary | ICD-10-CM

## 2023-11-02 NOTE — Telephone Encounter (Signed)
Last Fill: 10/05/2023  Next Visit: 11/13/2023  Last Visit: 08/11/2023  Dx:  Primary osteoarthritis of both hands   Current Dose per office note on 09/09/2023: gabapentin 100 mg tablet, 2 tablets p.o. at bedtime.   Okay to refill Gabapentin?

## 2023-11-10 ENCOUNTER — Ambulatory Visit: Payer: BC Managed Care – PPO | Attending: Physician Assistant | Admitting: Physician Assistant

## 2023-11-10 ENCOUNTER — Encounter: Payer: Self-pay | Admitting: Physician Assistant

## 2023-11-10 ENCOUNTER — Other Ambulatory Visit: Payer: Self-pay | Admitting: *Deleted

## 2023-11-10 VITALS — BP 144/81 | HR 73 | Resp 14 | Ht 70.0 in | Wt 307.0 lb

## 2023-11-10 DIAGNOSIS — M2241 Chondromalacia patellae, right knee: Secondary | ICD-10-CM

## 2023-11-10 DIAGNOSIS — Z111 Encounter for screening for respiratory tuberculosis: Secondary | ICD-10-CM

## 2023-11-10 DIAGNOSIS — S52001D Unspecified fracture of upper end of right ulna, subsequent encounter for closed fracture with routine healing: Secondary | ICD-10-CM

## 2023-11-10 DIAGNOSIS — M79641 Pain in right hand: Secondary | ICD-10-CM | POA: Diagnosis not present

## 2023-11-10 DIAGNOSIS — L4050 Arthropathic psoriasis, unspecified: Secondary | ICD-10-CM | POA: Diagnosis not present

## 2023-11-10 DIAGNOSIS — Z9225 Personal history of immunosupression therapy: Secondary | ICD-10-CM

## 2023-11-10 DIAGNOSIS — L409 Psoriasis, unspecified: Secondary | ICD-10-CM | POA: Diagnosis not present

## 2023-11-10 DIAGNOSIS — M19041 Primary osteoarthritis, right hand: Secondary | ICD-10-CM

## 2023-11-10 DIAGNOSIS — Z87828 Personal history of other (healed) physical injury and trauma: Secondary | ICD-10-CM

## 2023-11-10 DIAGNOSIS — I8393 Asymptomatic varicose veins of bilateral lower extremities: Secondary | ICD-10-CM | POA: Diagnosis not present

## 2023-11-10 DIAGNOSIS — Z79899 Other long term (current) drug therapy: Secondary | ICD-10-CM

## 2023-11-10 DIAGNOSIS — M67911 Unspecified disorder of synovium and tendon, right shoulder: Secondary | ICD-10-CM

## 2023-11-10 DIAGNOSIS — M19042 Primary osteoarthritis, left hand: Secondary | ICD-10-CM

## 2023-11-10 DIAGNOSIS — Z862 Personal history of diseases of the blood and blood-forming organs and certain disorders involving the immune mechanism: Secondary | ICD-10-CM

## 2023-11-10 DIAGNOSIS — M1712 Unilateral primary osteoarthritis, left knee: Secondary | ICD-10-CM

## 2023-11-10 DIAGNOSIS — Z8679 Personal history of other diseases of the circulatory system: Secondary | ICD-10-CM

## 2023-11-10 MED ORDER — PREDNISONE 5 MG PO TABS: ORAL_TABLET | ORAL | 0 refills | Status: DC

## 2023-11-10 NOTE — Progress Notes (Signed)
 Office Visit Note  Patient: Joseph [name]             Date of Birth: [birth year]           MRN: [id number]             PCP: Jacques Camie Pepper, PA-C Referring: Jacques Camie Pepper SHAUNNA* Visit Date: 11/10/2023 Occupation: @GUAROCC @  Subjective:  Right hand stiffness   History of Present Illness: Joseph [name] is a 42 y.o. male with history of psoriatic arthritis. He remains on Taltz  80 mg sq injections every 28 days.  He continues tolerate Taltz  without any side effects or injection site reactions.  He has not yet rescheduled NCV with EMG but has noticed an improvement in the paresthesias he was experiencing in the right hand.  He has had persistent pain and stiffness in the mornings lasting 30 minutes daily.  His morning stiffness has been worse during the cooler winter months.  Patient states that he rates the pain in his right hand a 5 out of 10 in the mornings but by the afternoons he is pain-free.  He denies any Achilles tendinitis or plantar fasciitis.  He denies any SI joint pain.  He denies any other joint pain or inflammation at this time.  He denies any active psoriasis.    Activities of Daily Living:  Patient reports morning stiffness for 30 minutes.   Patient Denies nocturnal pain.  Difficulty dressing/grooming: Denies Difficulty climbing stairs: Denies Difficulty getting out of chair: Denies Difficulty using hands for taps, buttons, cutlery, and/or writing: Denies  Review of Systems  Constitutional:  Negative for fatigue.  HENT:  Negative for mouth sores and mouth dryness.   Eyes:  Negative for dryness.  Respiratory:  Negative for shortness of breath.   Cardiovascular:  Negative for chest pain and palpitations.  Gastrointestinal:  Negative for blood in stool, constipation and diarrhea.  Endocrine: Negative for increased urination.  Genitourinary:  Negative for involuntary urination.  Musculoskeletal:  Positive for joint pain, joint pain, joint swelling and morning  stiffness. Negative for gait problem, myalgias, muscle weakness, muscle tenderness and myalgias.  Skin:  Negative for color change, rash, hair loss and sensitivity to sunlight.  Allergic/Immunologic: Negative for susceptible to infections.  Neurological:  Negative for dizziness and headaches.  Hematological:  Negative for swollen glands.  Psychiatric/Behavioral:  Negative for depressed mood and sleep disturbance. The patient is not nervous/anxious.     PMFS History:  Patient Active Problem List   Diagnosis Date Noted   Acute medial meniscus tear of right knee    Elbow dislocation, right, initial encounter 03/06/2020   Closed disp fx of right olecranon with intraartic exten w malunion 02/14/2020   Morbid obesity with BMI of 45.0-49.9, adult (HCC) 11/25/2019   Hair loss 12/31/2017   Varicose veins of both lower extremities 07/26/2017   Iron deficiency anemia 09/21/2014   Psoriatic arthritis (HCC) 09/21/2014   ESSENTIAL HYPERTENSION, MALIGNANT 02/18/2009   Nonspecific (abnormal) findings on radiological and other examination of body structure 02/16/2009   CHEST XRAY, ABNORMAL 02/16/2009    Past Medical History:  Diagnosis Date   Acute sinus infection 11/07/2014   Arthritis    Blood transfusion without reported diagnosis    At [facility]    Difficult intubation 01/13/2020   difficult airway in OR (per Dr Auston)   Iron deficiency anemia 09/21/2014   Psoriasis    Psoriatic arthritis (HCC)    Psoriatic arthritis (HCC)    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 Lavona 02/2009 with echo, as needed f/u   Torn meniscus    right knee    Family History  Problem Relation Age of Onset   Pulmonary fibrosis Mother    Hypertension Mother    Pancreatic cancer Maternal Grandmother    Rheum arthritis Paternal Aunt    Cancer Paternal Grandmother    Breast cancer Paternal Grandmother     COPD Paternal Grandfather    CAD Paternal Grandfather    Colon cancer Neg Hx    Prostate cancer Neg Hx    Diabetes Neg Hx    Thyroid  disease Neg Hx    Liver cancer Neg Hx    Stomach cancer Neg Hx    Rectal cancer Neg Hx    Past Surgical History:  Procedure Laterality Date   COLONOSCOPY WITH ESOPHAGOGASTRODUODENOSCOPY (EGD)     ENDOVENOUS ABLATION SAPHENOUS VEIN W/ LASER  10/16/2022   endovenous laser ablation right greater saphenous vein by Gaile New MD   EXTERNAL FIXATION REMOVAL Right 03/22/2020   Procedure: REMOVAL EXTERNAL FIXATION ARM;  Surgeon: Kendal Franky SQUIBB, MD;  Location: [facility] OR;  Service: Orthopedics;  Laterality: Right;   HERNIA REPAIR  1994   umbicial   INNER EAR SURGERY Left    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, 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   KNEE ARTHROSCOPY Right 05/10/2021   Procedure: ARTHROSCOPY KNEE partial medial meniscectomy;  Surgeon: Addie Cordella Hamilton, MD;  Location: [facility];  Service: Orthopedics;  Laterality: Right;   LIGAMENT REPAIR Right 01/13/2020   Procedure: COLLATERAL LIGAMENT REPAIRS;  Surgeon: Sebastian Lenis, MD;  Location: [facility];  Service: Orthopedics;  Laterality: Right;   ORIF ELBOW FRACTURE Right 02/17/2020   Procedure: REVISION OPEN REDUCTION INTERNAL FIXATION (ORIF) ELBOW/OLECRANON FRACTURE;  Surgeon: Kendal Franky SQUIBB, MD;  Location: [facility] OR;  Service: Orthopedics;  Laterality: Right;   ORIF ULNAR FRACTURE Right 01/13/2020   Procedure: OPEN TREATMENT OF COMPLEX RIGHT PROXIMAL ULNA FRACTURE;  Surgeon: Sebastian Lenis, MD;  Location: [facility];  Service: Orthopedics;  Laterality: Right;  PROCEDURE: OPEN TREATMENT OF COMPLEX RIGHT PROXIMAL ULNA FRACTURE WITH POSSIBLE COLLATERAL LIGAMENT REPAIRS LENGTH OF SURGERY: 2.5 HOURSMAC + REGIONAL BLOCK   Social History   Social History Narrative   Right handed   Caffeien use:1 soda per day   Immunization History  Administered Date(s) Administered    Influenza,inj,Quad PF,6+ Mos 11/20/2021   MMR 02/12/2001   PFIZER Comirnaty(Gray Top)Covid-19 Tri-Sucrose Vaccine 09/21/2020, 10/12/2020   PNEUMOCOCCAL CONJUGATE-20 11/20/2021   Pneumococcal Polysaccharide-23 11/10/2012   Td 08/17/2001     Objective: Vital Signs: BP (!) 144/81 (BP Location: Left Arm, Patient Position: Sitting, Cuff Size: Normal)   Pulse 73   Resp 14   Ht 5' 10 (1.778 m)   Wt (!) 307 lb (139.3 kg)   BMI 44.05 kg/m    Physical Exam Vitals and nursing note reviewed.  Constitutional:      Appearance: [name] well-developed.  HENT:     Head: Normocephalic and atraumatic.  Eyes:     Conjunctiva/sclera: Conjunctivae normal.     Pupils: Pupils are equal, round, and reactive to light.  Cardiovascular:     Rate and Rhythm: Normal rate and regular rhythm.     Heart sounds: Normal heart sounds.  Pulmonary:     Effort: Pulmonary effort is normal.     Breath  sounds: Normal breath sounds.  Abdominal:     General: Bowel sounds are normal.     Palpations: Abdomen is soft.  Musculoskeletal:     Cervical back: Normal range of motion and neck supple.  Skin:    General: Skin is warm and dry.     Capillary Refill: Capillary refill takes less than 2 seconds.  Neurological:     Mental Status: [name] alert and oriented to person, place, and time.  Psychiatric:        Behavior: Behavior normal.      Musculoskeletal Exam: C-spine, thoracic spine, lumbar spine have good range of motion.  Shoulder joints have good range of motion with no discomfort.  Right elbow joint flexion contracture.  Limited range of motion of both wrist joints.  Flexion contracture of the left fifth PIP joint.  Incomplete fist formation noted.  Tenderness of the right second, third, and fifth MCP joints.  Knee joints have good range of motion no warmth or effusion.  Ankle joints have good range of motion with no joint tenderness.  Pedal edema noted bilateral lower extremities.  CDAI Exam: CDAI Score:  -- Patient Global: --; Provider Global: -- Swollen: --; Tender: -- Joint Exam 11/10/2023   No joint exam has been documented for this visit   There is currently no information documented on the homunculus. Go to the Rheumatology activity and complete the homunculus joint exam.  Investigation: No additional findings.  Imaging: No results found.  Recent Labs: Lab Results  Component Value Date   WBC 9.6 06/26/2023   HGB 15.0 06/26/2023   PLT 212 06/26/2023   NA 136 06/26/2023   K 3.8 06/26/2023   CL 101 06/26/2023   CO2 24 06/26/2023   GLUCOSE 108 (H) 06/26/2023   BUN 11 06/26/2023   CREATININE 0.95 06/26/2023   BILITOT 0.6 06/26/2023   ALKPHOS 128 (H) 04/14/2022   AST 15 06/26/2023   ALT 16 06/26/2023   PROT 7.7 06/26/2023   ALBUMIN  4.2 04/14/2022   CALCIUM 9.0 06/26/2023   GFRAA 112 04/19/2021   QFTBGOLD Negative 04/03/2017   QFTBGOLDPLUS NEGATIVE 12/23/2022    Speciality Comments: Prior therapy: Enbrel/Humira (inadequate response)  and Remicaide (d/c due to cost and time consuming infusion), Cosentyx  stopped 04/19/21. Talt started 05/27/21  FU q 3 months  Procedures:  No procedures performed Allergies: Sulfonamide derivatives and Bee venom   Assessment / Plan:     Visit Diagnoses: Psoriatic arthritis (HCC) -Patient presents today with ongoing pain and stiffness involving the right hand.  His morning stiffness and discomfort have been most severe first thing in the morning lasting for 30 minutes daily.  He has been taking hot showers and has been trying to massage his hand to alleviate the joint stiffness.  He states in the mornings his pain level is a 5 out of 10 and then throughout the day his symptoms are alleviated.  He has noticed an increased pain and stiffness involving his right hand with the cooler weather temperatures.  He recently traveled to [place]  and while there the pain and stiffness in his hand resolved. [name] not experiencing any other joint pain or  inflammation at this time.  No evidence of Achilles tendinitis or plantar fasciitis.  No SI joint tenderness upon palpation.  No active psoriasis at this time.  He remains on Taltz  80 mg sq injections once every 4 weeks.  Discussed that I am apprehensive to switch or discontinue Taltz  since it has overall been  managing his psoriasis and psoriatic arthritis.  Discussed that he may benefit from combination therapy in the future if he continues to have persistent symptoms.  He has not yet rescheduled the NCV with EMG for further evaluation.  The paresthesias in his right hand have improved since initiating gabapentin .  Plan to check sed rate and CRP today.  A prednisone  taper was sent to the pharmacy as requested.  Instructions were provided.  If his symptoms persist or worsen despite taking prednisone  we can discuss combination therapy by adding on methotrexate  at his next office visit.  In the meantime he will remain on Taltz  as prescribed.  Plan: Sedimentation rate, C-reactive protein  Psoriasis: No active psoriasis at this time.   High risk medication use - Taltz  80 mg sq injections every 28 days. Previously had inadequate response to Enbrel, Humira, cosentyx  and Remicade . CBC and CMP updated on 06/26/23.  Orders for CBC and CMP released today.  His next lab work will be due at the end of march and every 3 months. TB gold negative on 12/23/22.  No recent or recurrent infections.  Discussed the importance of holding Taltz  if he develops signs or symptoms of an infection and to resume once the infection has completely cleared.   - Plan: CBC with Differential/Platelet, COMPLETE METABOLIC PANEL WITH GFR  Varicose veins of both lower extremities, unspecified whether complicated: Edema noted in bilateral LE  Primary osteoarthritis of both hands: Patient has been experiencing pain and stiffness involving his right hand especially first thing in the morning during the cooler weather months.  The swelling and  stiffness in his right hand is alleviated as the day goes on but has not been responsive to the use of Taltz .  The paresthesias in his right hand has improved since initiating gabapentin .  He has not yet rescheduled NCV with EMG for further evaluation.  Plan to check sed rate and CRP today.  A prednisone  taper was also sent to the pharmacy today and instructions were provided.  Discussed that if his symptoms persist or worsen he may benefit from adding on methotrexate  as combination therapy.  In the meantime he will remain on Taltz  as prescribed.  [name] advised to notify us  if he develops any new or worsening symptoms.  Pain in right hand -Plan to check ESR and CRP today.  A prednisone  taper starting at 20 mg tapering by 5 mg every 4 days was sent to the pharmacy today.  Instructions were provided.  Patient was advised to notify us  if his symptoms persist or worsen despite taking prednisone .  Discussed that if he has recurrence of symptoms we may need to discuss adding methotrexate  on as combination therapy.  Plan: Sedimentation rate, C-reactive protein  Primary osteoarthritis of left knee: Not currently symptomatic.  No warmth or effusion noted.  Tendinopathy of right shoulder: Good ROM with no discomfort at this time.   History of torn meniscus of right knee: No mechanical symptoms or effusion noted.   Chondromalacia patellae of right knee: Good range of motion.  No warmth or effusion noted.  Closed fracture of proximal end of right ulna with routine healing, unspecified fracture morphology, subsequent encounter - Previous fracture of the right proximal ulna on 12/15/2019 and underwent internal fixation by Dr. Sebastian on 01/13/2020.  Other medical conditions are listed as follows:  History of hypertension: Blood pressure was 144/81 today in the office.  History of iron deficiency anemia   Orders: Orders Placed This Encounter  Procedures  CBC with Differential/Platelet   COMPLETE METABOLIC  PANEL WITH GFR   Sedimentation rate   C-reactive protein   Meds ordered this encounter  Medications   predniSONE  (DELTASONE ) 5 MG tablet    Sig: Take 4 tabs po x 4 days, 3  tabs po x 4 days, 2  tabs po x 4 days, 1  tab po x 4 days    Dispense:  40 tablet    Refill:  0     Follow-Up Instructions: Return in about 3 months (around 02/08/2024) for Psoriatic arthritis.   Waddell CHRISTELLA Craze, PA-C  Note - This record has been created using Dragon software.  Chart creation errors have been sought, but may not always  have been located. Such creation errors do not reflect on  the standard of medical care.

## 2023-11-10 NOTE — Patient Instructions (Signed)
 Standing Labs We placed an order today for your standing lab work.   Please have your standing labs drawn at end of March and every 3 months   Please have your labs drawn 2 weeks prior to your appointment so that the provider can discuss your lab results at your appointment, if possible.  Please note that you may see your imaging and lab results in MyChart before we have reviewed them. We will contact you once all results are reviewed. Please allow our office up to 72 hours to thoroughly review all of the results before contacting the office for clarification of your results.  WALK-IN LAB HOURS  Monday through Thursday from 8:00 am -12:30 pm and 1:00 pm-5:00 pm and Friday from 8:00 am-12:00 pm.  Patients with office visits requiring labs will be seen before walk-in labs.  You may encounter longer than normal wait times. Please allow additional time. Wait times may be shorter on  Monday and Thursday afternoons.  We do not book appointments for walk-in labs. We appreciate your patience and understanding with our staff.   Labs are drawn by [facility]. Please bring your co-pay at the time of your lab draw.  You may receive a bill from [facility] for your lab work.  Please note if you are on Hydroxychloroquine and and an order has been placed for a Hydroxychloroquine level,  you will need to have it drawn 4 hours or more after your last dose.  If you wish to have your labs drawn at another location, please call the office 24 hours in advance so we can fax the orders.  The office is located at [address], [place], [place] [address]   If you have any questions regarding directions or hours of operation,  please call [phone].   As a reminder, please drink plenty of water prior to coming for your lab work. Thanks!

## 2023-11-11 LAB — COMPLETE METABOLIC PANEL WITH GFR
AG Ratio: 1.1 (calc) (ref 1.0–2.5)
ALT: 14 U/L (ref 9–46)
AST: 14 U/L (ref 10–40)
Albumin: 3.8 g/dL (ref 3.6–5.1)
Alkaline phosphatase (APISO): 95 U/L (ref 36–130)
BUN: 9 mg/dL (ref 7–25)
CO2: 25 mmol/L (ref 20–32)
Calcium: 8.5 mg/dL — ABNORMAL LOW (ref 8.6–10.3)
Chloride: 99 mmol/L (ref 98–110)
Creat: 1.01 mg/dL (ref 0.60–1.29)
Globulin: 3.6 g/dL (ref 1.9–3.7)
Glucose, Bld: 113 mg/dL — ABNORMAL HIGH (ref 65–99)
Potassium: 3.9 mmol/L (ref 3.5–5.3)
Sodium: 136 mmol/L (ref 135–146)
Total Bilirubin: 0.6 mg/dL (ref 0.2–1.2)
Total Protein: 7.4 g/dL (ref 6.1–8.1)
eGFR: 96 mL/min/{1.73_m2} (ref >=60)

## 2023-11-11 LAB — SEDIMENTATION RATE: Sed Rate: 31 mm/h — ABNORMAL HIGH (ref 0–15)

## 2023-11-11 LAB — CBC WITH DIFFERENTIAL/PLATELET
Absolute Lymphocytes: 2472 {cells}/uL (ref 850–3900)
Absolute Monocytes: 752 {cells}/uL (ref 200–950)
Basophils Absolute: 56 {cells}/uL (ref 0–200)
Basophils Relative: 0.6 %
Eosinophils Absolute: 226 {cells}/uL (ref 15–500)
Eosinophils Relative: 2.4 %
HCT: 44.1 % (ref 38.5–50.0)
Hemoglobin: 14.2 g/dL (ref 13.2–17.1)
MCH: 27.3 pg (ref 27.0–33.0)
MCHC: 32.2 g/dL (ref 32.0–36.0)
MCV: 84.6 fL (ref 80.0–100.0)
MPV: 12.2 fL (ref 7.5–12.5)
Monocytes Relative: 8 %
Neutro Abs: 5894 {cells}/uL (ref 1500–7800)
Neutrophils Relative %: 62.7 %
Platelets: 224 10*3/uL (ref 140–400)
RBC: 5.21 10*6/uL (ref 4.20–5.80)
RDW: 14.2 % (ref 11.0–15.0)
Total Lymphocyte: 26.3 %
WBC: 9.4 10*3/uL (ref 3.8–10.8)

## 2023-11-11 LAB — C-REACTIVE PROTEIN: CRP: 16.4 mg/L — ABNORMAL HIGH (ref <8.0)

## 2023-11-11 NOTE — Progress Notes (Signed)
 ESR is elevated-31.   CRP is elevated-16.4.   A prednisone  taper was prescribed at his office visit yesterday.  If his symptoms persist or worsen he will likely benefit from adding on methotrexate  as combination therapy.  CBC WNL  Glucose 113. Calcium is low-8.5. increase dietary calcium intake.

## 2023-11-12 ENCOUNTER — Ambulatory Visit: Payer: BC Managed Care – PPO | Admitting: Physician Assistant

## 2023-11-13 ENCOUNTER — Other Ambulatory Visit: Payer: Self-pay

## 2023-11-13 ENCOUNTER — Ambulatory Visit: Payer: BC Managed Care – PPO | Admitting: Physician Assistant

## 2023-11-13 DIAGNOSIS — M19041 Primary osteoarthritis, right hand: Secondary | ICD-10-CM

## 2023-11-13 DIAGNOSIS — I8393 Asymptomatic varicose veins of bilateral lower extremities: Secondary | ICD-10-CM

## 2023-11-13 DIAGNOSIS — M2241 Chondromalacia patellae, right knee: Secondary | ICD-10-CM

## 2023-11-13 DIAGNOSIS — Z79899 Other long term (current) drug therapy: Secondary | ICD-10-CM

## 2023-11-13 DIAGNOSIS — Z862 Personal history of diseases of the blood and blood-forming organs and certain disorders involving the immune mechanism: Secondary | ICD-10-CM

## 2023-11-13 DIAGNOSIS — Z87828 Personal history of other (healed) physical injury and trauma: Secondary | ICD-10-CM

## 2023-11-13 DIAGNOSIS — M1712 Unilateral primary osteoarthritis, left knee: Secondary | ICD-10-CM

## 2023-11-13 DIAGNOSIS — Z8679 Personal history of other diseases of the circulatory system: Secondary | ICD-10-CM

## 2023-11-13 DIAGNOSIS — L409 Psoriasis, unspecified: Secondary | ICD-10-CM

## 2023-11-13 DIAGNOSIS — M67911 Unspecified disorder of synovium and tendon, right shoulder: Secondary | ICD-10-CM

## 2023-11-13 DIAGNOSIS — L4050 Arthropathic psoriasis, unspecified: Secondary | ICD-10-CM

## 2023-11-13 DIAGNOSIS — S52001D Unspecified fracture of upper end of right ulna, subsequent encounter for closed fracture with routine healing: Secondary | ICD-10-CM

## 2023-11-16 ENCOUNTER — Other Ambulatory Visit: Payer: Self-pay

## 2023-11-18 ENCOUNTER — Other Ambulatory Visit: Payer: Self-pay

## 2023-11-19 ENCOUNTER — Other Ambulatory Visit: Payer: Self-pay

## 2023-11-19 ENCOUNTER — Telehealth: Payer: Self-pay | Admitting: Pharmacist

## 2023-11-19 ENCOUNTER — Other Ambulatory Visit (HOSPITAL_COMMUNITY): Payer: Self-pay

## 2023-11-19 ENCOUNTER — Other Ambulatory Visit: Payer: Self-pay | Admitting: Rheumatology

## 2023-11-19 DIAGNOSIS — L4050 Arthropathic psoriasis, unspecified: Secondary | ICD-10-CM

## 2023-11-19 DIAGNOSIS — L409 Psoriasis, unspecified: Secondary | ICD-10-CM

## 2023-11-19 MED ORDER — TALTZ 80 MG/ML ~~LOC~~ SOAJ
80.0000 mg | SUBCUTANEOUS | 0 refills | Status: DC
  Filled 2023-11-19: qty 3, 84d supply, fill #0

## 2023-11-19 NOTE — Telephone Encounter (Signed)
 Last Fill: 10/15/2023  Labs: 11/10/2023 ESR is elevated-31.   CRP is elevated-16.4. CBC WNL Glucose 113. Calcium is low-8.5.  TB Gold: 12/23/2022 Neg    Next Visit: 02/05/2024  Last Visit: 11/10/2023  IK:Ednmpjupr arthritis   Current Dose per office note 11/10/2023: Taltz  80 mg sq injections every 28 days.   Okay to refill Taltz ?

## 2023-11-19 NOTE — Progress Notes (Signed)
 Specialty Pharmacy Refill Coordination Note  Joseph [name] is a 43 y.o. male contacted today regarding refills of specialty medication(s) Ixekizumab  (Taltz )   Patient requested Delivery   Delivery date: 11/20/23   Verified address: [address], [place] [address]   Medication will be filled on 11/19/23.

## 2023-11-19 NOTE — Telephone Encounter (Signed)
 Received notice from Tamra, CPhT, at [facility] that patient needs new Taltz  copay. Appears last card expired on 11/10/2023  Called patient's wife to renew copay card and provided to pharmacy: They will reprocess claim and reach out to patient to schedule shipment as needed.   Sherry Pennant, PharmD, MPH, BCPS, CPP Clinical Pharmacist (Rheumatology and Pulmonology)

## 2023-12-10 ENCOUNTER — Other Ambulatory Visit: Payer: Self-pay

## 2023-12-15 ENCOUNTER — Other Ambulatory Visit: Payer: Self-pay | Admitting: Medical

## 2024-01-22 NOTE — Progress Notes (Deleted)
 Office Visit Note  Patient: Joseph [name]             Date of Birth: 28-Jan-1981           MRN: [id number]             PCP: Dani Gobble, PA-C Referring: Fransisca Kaufmann* Visit Date: 02/05/2024 Occupation: @GUAROCC @  Subjective:    History of Present Illness: COREON SIMKINS is a 43 y.o. male with history of psoriatic arthritis and osteoarthritis. Patient remains on Taltz 80 mg sq injections every 28 days   CBC and CMP were drawn on 11/10/2023.  Orders for CBC and CMP were released today. TB Gold negative on 12/23/2022.  Order for TB gold released today. Discussed the importance of holding Taltz if he develops signs or symptoms of an infection and to resume once the infection has completely cleared.  Activities of Daily Living:  Patient reports morning stiffness for *** {minute/hour:19697}.   Patient {ACTIONS;DENIES/REPORTS:21021675::"Denies"} nocturnal pain.  Difficulty dressing/grooming: {ACTIONS;DENIES/REPORTS:21021675::"Denies"} Difficulty climbing stairs: {ACTIONS;DENIES/REPORTS:21021675::"Denies"} Difficulty getting out of chair: {ACTIONS;DENIES/REPORTS:21021675::"Denies"} Difficulty using hands for taps, buttons, cutlery, and/or writing: {ACTIONS;DENIES/REPORTS:21021675::"Denies"}  No Rheumatology ROS completed.   PMFS History:  Patient Active Problem List   Diagnosis Date Noted   Acute medial meniscus tear of right knee    Elbow dislocation, right, initial encounter 03/06/2020   Closed disp fx of right olecranon with intraartic exten w malunion 02/14/2020   Morbid obesity with BMI of 45.0-49.9, adult (HCC) 11/25/2019   Hair loss 12/31/2017   Varicose veins of both lower extremities 07/26/2017   Iron deficiency anemia 09/21/2014   Psoriatic arthritis (HCC) 09/21/2014   ESSENTIAL HYPERTENSION, MALIGNANT 02/18/2009   Nonspecific (abnormal) findings on radiological and other examination of body structure 02/16/2009   CHEST XRAY, ABNORMAL 02/16/2009     Past Medical History:  Diagnosis Date   Acute sinus infection 11/07/2014   Arthritis    Blood transfusion without reported diagnosis    At [facility]    Difficult intubation 01/13/2020   difficult airway in OR (per Dr Rollene Fare)   Iron deficiency anemia 09/21/2014   Psoriasis    Psoriatic arthritis (HCC)    Psoriatic arthritis (HCC)    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 Antoine Poche 02/2009 with echo, as needed f/u   Torn meniscus    right knee    Family History  Problem Relation Age of Onset   Pulmonary fibrosis Mother    Hypertension Mother    Pancreatic cancer Maternal Grandmother    Rheum arthritis Paternal Aunt    Cancer Paternal Grandmother    Breast cancer Paternal Grandmother    COPD Paternal Grandfather    CAD Paternal Grandfather    Colon cancer Neg Hx    Prostate cancer Neg Hx    Diabetes Neg Hx    Thyroid disease Neg Hx    Liver cancer Neg Hx    Stomach cancer Neg Hx    Rectal cancer Neg Hx    Past Surgical History:  Procedure Laterality Date   COLONOSCOPY WITH ESOPHAGOGASTRODUODENOSCOPY (EGD)     ENDOVENOUS ABLATION SAPHENOUS VEIN W/ LASER  10/16/2022   endovenous laser ablation right greater saphenous vein by Coral Else MD   EXTERNAL FIXATION REMOVAL Right 03/22/2020   Procedure: REMOVAL EXTERNAL FIXATION ARM;  Surgeon: Roby Lofts, MD;  Location: [facility];  Service: Orthopedics;  Laterality: Right;  HERNIA REPAIR  1994   umbicial   INNER EAR SURGERY Left    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, 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   KNEE ARTHROSCOPY Right 05/10/2021   Procedure: ARTHROSCOPY KNEE partial medial meniscectomy;  Surgeon: Cammy Copa, MD;  Location: [facility];  Service: Orthopedics;  Laterality: Right;   LIGAMENT REPAIR Right 01/13/2020   Procedure: COLLATERAL LIGAMENT REPAIRS;  Surgeon: Mack Hook, MD;  Location: [facility];  Service: Orthopedics;  Laterality: Right;   ORIF ELBOW FRACTURE Right 02/17/2020   Procedure: REVISION OPEN REDUCTION INTERNAL FIXATION (ORIF) ELBOW/OLECRANON FRACTURE;  Surgeon: Roby Lofts, MD;  Location: [facility];  Service: Orthopedics;  Laterality: Right;   ORIF ULNAR FRACTURE Right 01/13/2020   Procedure: OPEN TREATMENT OF COMPLEX RIGHT PROXIMAL ULNA FRACTURE;  Surgeon: Mack Hook, MD;  Location:  SURGERY CENTER;  Service: Orthopedics;  Laterality: Right;  PROCEDURE: OPEN TREATMENT OF COMPLEX RIGHT PROXIMAL ULNA FRACTURE WITH POSSIBLE COLLATERAL LIGAMENT REPAIRS LENGTH OF SURGERY: 2.5 HOURSMAC + REGIONAL BLOCK   Social History   Social History Narrative   Right handed   Caffeien use:1 soda per day   Immunization History  Administered Date(s) Administered   Influenza,inj,Quad PF,6+ Mos 11/20/2021   MMR 02/12/2001   PFIZER Comirnaty(Gray Top)Covid-19 Tri-Sucrose Vaccine 09/21/2020, 10/12/2020   PNEUMOCOCCAL CONJUGATE-20 11/20/2021   Pneumococcal Polysaccharide-23 11/10/2012   Td 08/17/2001     Objective: Vital Signs: There were no vitals taken for this visit.   Physical Exam Vitals and nursing note reviewed.  Constitutional:      Appearance: [name] well-developed.  HENT:     Head: Normocephalic and atraumatic.  Eyes:     Conjunctiva/sclera: Conjunctivae normal.     Pupils: Pupils are equal, round, and reactive to light.  Cardiovascular:     Rate and Rhythm: Normal rate and regular rhythm.     Heart sounds: Normal heart sounds.  Pulmonary:     Effort: Pulmonary effort is normal.     Breath sounds: Normal breath sounds.  Abdominal:     General: Bowel sounds are normal.     Palpations: Abdomen is soft.  Musculoskeletal:     Cervical back: Normal range of motion and neck supple.  Skin:    General: Skin is warm and dry.     Capillary Refill: Capillary refill takes less than 2 seconds.  Neurological:     Mental Status: [name] alert and  oriented to person, place, and time.  Psychiatric:        Behavior: Behavior normal.      Musculoskeletal Exam: ***  CDAI Exam: CDAI Score: -- Patient Global: --; Provider Global: -- Swollen: --; Tender: -- Joint Exam 02/05/2024   No joint exam has been documented for this visit   There is currently no information documented on the homunculus. Go to the Rheumatology activity and complete the homunculus joint exam.  Investigation: No additional findings.  Imaging: No results found.  Recent Labs: Lab Results  Component Value Date   WBC 9.4 11/10/2023   HGB 14.2 11/10/2023   PLT 224 11/10/2023   NA 136 11/10/2023   K 3.9 11/10/2023   CL 99 11/10/2023   CO2 25 11/10/2023   GLUCOSE 113 (H) 11/10/2023   BUN 9 11/10/2023   CREATININE 1.01 11/10/2023   BILITOT 0.6 11/10/2023   ALKPHOS 128 (H) 04/14/2022   AST 14 11/10/2023   ALT 14 11/10/2023   PROT 7.4 11/10/2023   ALBUMIN 4.2  04/14/2022   CALCIUM 8.5 (L) 11/10/2023   GFRAA 112 04/19/2021   QFTBGOLD Negative 04/03/2017   QFTBGOLDPLUS NEGATIVE 12/23/2022    Speciality Comments: Prior therapy: Enbrel/Humira (inadequate response)  and Remicaide (d/c due to cost and time consuming infusion), Cosentyx stopped 04/19/21. Talt started 05/27/21  FU q 3 months  Procedures:  No procedures performed Allergies: Sulfonamide derivatives and Bee venom   Assessment / Plan:     Visit Diagnoses: Psoriatic arthritis (HCC)  Psoriasis  High risk medication use  Varicose veins of both lower extremities, unspecified whether complicated  Primary osteoarthritis of both hands  Primary osteoarthritis of left knee  Tendinopathy of right shoulder  History of torn meniscus of right knee  Chondromalacia patellae of right knee  Closed fracture of proximal end of right ulna with routine healing, unspecified fracture morphology, subsequent encounter  History of hypertension  History of iron deficiency anemia  Orders: No orders  of the defined types were placed in this encounter.  No orders of the defined types were placed in this encounter.   Face-to-face time spent with patient was *** minutes. Greater than 50% of time was spent in counseling and coordination of care.  Follow-Up Instructions: No follow-ups on file.   Gearldine Bienenstock, PA-C  Note - This record has been created using Dragon software.  Chart creation errors have been sought, but may not always  have been located. Such creation errors do not reflect on  the standard of medical care.

## 2024-02-05 ENCOUNTER — Ambulatory Visit: Payer: BC Managed Care – PPO | Admitting: Physician Assistant

## 2024-02-05 DIAGNOSIS — I8393 Asymptomatic varicose veins of bilateral lower extremities: Secondary | ICD-10-CM

## 2024-02-05 DIAGNOSIS — L409 Psoriasis, unspecified: Secondary | ICD-10-CM

## 2024-02-05 DIAGNOSIS — M67911 Unspecified disorder of synovium and tendon, right shoulder: Secondary | ICD-10-CM

## 2024-02-05 DIAGNOSIS — M19041 Primary osteoarthritis, right hand: Secondary | ICD-10-CM

## 2024-02-05 DIAGNOSIS — M2241 Chondromalacia patellae, right knee: Secondary | ICD-10-CM

## 2024-02-05 DIAGNOSIS — S52001D Unspecified fracture of upper end of right ulna, subsequent encounter for closed fracture with routine healing: Secondary | ICD-10-CM

## 2024-02-05 DIAGNOSIS — Z79899 Other long term (current) drug therapy: Secondary | ICD-10-CM

## 2024-02-05 DIAGNOSIS — Z87828 Personal history of other (healed) physical injury and trauma: Secondary | ICD-10-CM

## 2024-02-05 DIAGNOSIS — Z862 Personal history of diseases of the blood and blood-forming organs and certain disorders involving the immune mechanism: Secondary | ICD-10-CM

## 2024-02-05 DIAGNOSIS — M1712 Unilateral primary osteoarthritis, left knee: Secondary | ICD-10-CM

## 2024-02-05 DIAGNOSIS — Z8679 Personal history of other diseases of the circulatory system: Secondary | ICD-10-CM

## 2024-02-05 DIAGNOSIS — L4050 Arthropathic psoriasis, unspecified: Secondary | ICD-10-CM

## 2024-02-08 ENCOUNTER — Other Ambulatory Visit: Payer: Self-pay

## 2024-02-10 ENCOUNTER — Other Ambulatory Visit: Payer: Self-pay

## 2024-02-15 ENCOUNTER — Other Ambulatory Visit: Payer: Self-pay

## 2024-02-15 ENCOUNTER — Other Ambulatory Visit: Payer: Self-pay | Admitting: Rheumatology

## 2024-02-15 DIAGNOSIS — Z111 Encounter for screening for respiratory tuberculosis: Secondary | ICD-10-CM

## 2024-02-15 DIAGNOSIS — L409 Psoriasis, unspecified: Secondary | ICD-10-CM

## 2024-02-15 DIAGNOSIS — L4050 Arthropathic psoriasis, unspecified: Secondary | ICD-10-CM

## 2024-02-15 DIAGNOSIS — Z9225 Personal history of immunosupression therapy: Secondary | ICD-10-CM

## 2024-02-15 MED ORDER — TALTZ 80 MG/ML ~~LOC~~ SOAJ
80.0000 mg | SUBCUTANEOUS | 0 refills | Status: DC
  Filled 2024-02-15: qty 1, 28d supply, fill #0

## 2024-02-15 NOTE — Addendum Note (Signed)
 Addended by: Henriette Combs on: 02/15/2024 10:32 AM   Modules accepted: Orders

## 2024-02-15 NOTE — Progress Notes (Signed)
 Specialty Pharmacy Refill Coordination Note  Joseph [name] is a 43 y.o. male contacted today regarding refills of specialty medication(s) Ixekizumab Altamease Oiler)   Patient requested (Patient-Rptd) Delivery   Delivery date: (Patient-Rptd) 02/19/24   Verified address: (Patient-Rptd) [address] [place] [address]   Medication will be filled on 04.10.25.   This fill date is pending response to refill request from provider. Patient is aware and if they have not received fill by intended date they must follow up with pharmacy.

## 2024-02-15 NOTE — Progress Notes (Signed)
 Added 28 day Outreach Md only sent in 1 ml for 28 days. Not a 84 day supply

## 2024-02-15 NOTE — Telephone Encounter (Signed)
 Last Fill: 11/19/2023  Labs: 11/10/2023 ESR is elevated-31.   CRP is elevated-16.4.  CBC WNL Glucose 113. Calcium is low-8.5.  TB Gold: 12/23/2022 Neg    Next Visit: 03/28/2024  Last Visit: 10/13/2023  DX: Psoriatic arthritis   Current Dose per office note 11/10/2023: Taltz 80 mg sq injections once every 4 weeks.   Patient's wife Shanda Bumps advised patient is due to update labs. Patient will update them as soon as possible.   Okay to refill Taltz?

## 2024-02-18 ENCOUNTER — Other Ambulatory Visit: Payer: Self-pay

## 2024-03-02 ENCOUNTER — Other Ambulatory Visit: Payer: Self-pay | Admitting: *Deleted

## 2024-03-02 DIAGNOSIS — Z9225 Personal history of immunosupression therapy: Secondary | ICD-10-CM

## 2024-03-02 DIAGNOSIS — Z111 Encounter for screening for respiratory tuberculosis: Secondary | ICD-10-CM

## 2024-03-02 DIAGNOSIS — Z79899 Other long term (current) drug therapy: Secondary | ICD-10-CM

## 2024-03-04 DIAGNOSIS — Z9225 Personal history of immunosupression therapy: Secondary | ICD-10-CM | POA: Diagnosis not present

## 2024-03-04 DIAGNOSIS — Z79899 Other long term (current) drug therapy: Secondary | ICD-10-CM | POA: Diagnosis not present

## 2024-03-07 NOTE — Progress Notes (Signed)
 Glucose is mildly elevated, probably not a fasting sample.  Calcium is low.  Patient should consume calcium rich diet.  CBC is normal.  TB Gold is pending.

## 2024-03-08 ENCOUNTER — Encounter (HOSPITAL_COMMUNITY): Payer: Self-pay

## 2024-03-08 ENCOUNTER — Other Ambulatory Visit (HOSPITAL_COMMUNITY): Payer: Self-pay

## 2024-03-08 ENCOUNTER — Other Ambulatory Visit: Payer: Self-pay

## 2024-03-08 ENCOUNTER — Other Ambulatory Visit: Payer: Self-pay | Admitting: Physician Assistant

## 2024-03-08 DIAGNOSIS — L409 Psoriasis, unspecified: Secondary | ICD-10-CM

## 2024-03-08 DIAGNOSIS — L4050 Arthropathic psoriasis, unspecified: Secondary | ICD-10-CM

## 2024-03-08 MED ORDER — TALTZ 80 MG/ML ~~LOC~~ SOAJ
80.0000 mg | SUBCUTANEOUS | 0 refills | Status: DC
  Filled 2024-03-08: qty 3, 84d supply, fill #0

## 2024-03-08 NOTE — Telephone Encounter (Signed)
 Last Fill: 02/15/2024 (30 day supply)   Labs: 03/04/2024 Glucose is mildly elevated, probably not a fasting sample.  Calcium is low.  Patient should consume calcium rich diet.  CBC is normal.    TB Gold: 12/23/2022 Neg  (update 03/04/2024, results pending)   Next Visit: 03/28/2024   Last Visit: 10/13/2023   DX: Psoriatic arthritis    Current Dose per office note 11/10/2023: Taltz  80 mg sq injections once every 4 weeks.    Okay to refill Taltz ?

## 2024-03-08 NOTE — Progress Notes (Signed)
 Specialty Pharmacy Refill Coordination Note  Joseph [name] is a 43 y.o. male contacted today regarding refills of specialty medication(s) Ixekizumab  (Taltz )   Patient requested Delivery   Delivery date: 03/18/24   Verified address: [address] [place] [address]   Medication will be filled on 03/17/2024. This fill date is pending response to refill request from provider. Patient is aware and if they have not received fill by intended date they must follow up with pharmacy.

## 2024-03-08 NOTE — Progress Notes (Signed)
 Specialty Pharmacy Ongoing Clinical Assessment Note  [name] is a 43 y.o. male who is being followed by the specialty pharmacy service for RxSp Psoriatic Arthritis   Patient's specialty medication(s) reviewed today: Ixekizumab  (Taltz )   Missed doses in the last 4 weeks: 0   Patient/Caregiver did not have any additional questions or concerns.   Therapeutic benefit summary: Patient is achieving benefit   Adverse events/side effects summary: No adverse events/side effects   Patient's therapy is appropriate to: Continue    Goals Addressed             This Visit's Progress    Minimize recurrence of flares   On track    Patient is on track. Patient will maintain adherence.  Jacinta Martinis, PA is monitoring and determining if a change in therapy is warranted. Patient reports that he is well controlled on therapy at this time.         Follow up:  6 months  [facility]

## 2024-03-10 LAB — QUANTIFERON-TB GOLD PLUS
QuantiFERON Nil Value: 0.07 [IU]/mL
QuantiFERON TB1 Ag Value: 0.07 [IU]/mL
QuantiFERON TB2 Ag Value: 0.08 [IU]/mL

## 2024-03-10 LAB — CMP14+EGFR
ALT: 16 IU/L (ref 0–44)
AST: 13 IU/L (ref 0–40)
Albumin: 3.8 g/dL — ABNORMAL LOW (ref 4.1–5.1)
Alkaline Phosphatase: 113 IU/L (ref 44–121)
BUN/Creatinine Ratio: 10 (ref 9–20)
BUN: 11 mg/dL (ref 6–24)
Bilirubin Total: 0.4 mg/dL (ref 0.0–1.2)
CO2: 21 mmol/L (ref 20–29)
Calcium: 8.2 mg/dL — ABNORMAL LOW (ref 8.7–10.2)
Chloride: 101 mmol/L (ref 96–106)
Creatinine, Ser: 1.05 mg/dL (ref 0.76–1.27)
Globulin, Total: 3.7 g/dL (ref 1.5–4.5)
Glucose: 113 mg/dL — ABNORMAL HIGH (ref 70–99)
Potassium: 3.9 mmol/L (ref 3.5–5.2)
Sodium: 137 mmol/L (ref 134–144)
Total Protein: 7.5 g/dL (ref 6.0–8.5)
eGFR: 92 mL/min/{1.73_m2} (ref >59)

## 2024-03-10 LAB — CBC WITH DIFFERENTIAL/PLATELET
Basophils Absolute: 0 10*3/uL (ref 0.0–0.2)
Basos: 0 %
EOS (ABSOLUTE): 0.3 10*3/uL (ref 0.0–0.4)
Eos: 3 %
Hematocrit: 44.7 % (ref 37.5–51.0)
Hemoglobin: 14.4 g/dL (ref 13.0–17.7)
Immature Grans (Abs): 0.1 10*3/uL (ref 0.0–0.1)
Immature Granulocytes: 1 %
Lymphocytes Absolute: 2.3 10*3/uL (ref 0.7–3.1)
Lymphs: 26 %
MCH: 27.4 pg (ref 26.6–33.0)
MCHC: 32.2 g/dL (ref 31.5–35.7)
MCV: 85 fL (ref 79–97)
Monocytes Absolute: 0.6 10*3/uL (ref 0.1–0.9)
Monocytes: 7 %
Neutrophils Absolute: 5.7 10*3/uL (ref 1.4–7.0)
Neutrophils: 63 %
Platelets: 209 10*3/uL (ref 150–450)
RBC: 5.26 x10E6/uL (ref 4.14–5.80)
RDW: 13.7 % (ref 11.6–15.4)
WBC: 8.9 10*3/uL (ref 3.4–10.8)

## 2024-03-10 NOTE — Progress Notes (Signed)
TB Gold negative

## 2024-03-14 ENCOUNTER — Telehealth: Payer: Self-pay | Admitting: Pharmacist

## 2024-03-14 ENCOUNTER — Other Ambulatory Visit: Payer: Self-pay

## 2024-03-14 DIAGNOSIS — Z6841 Body Mass Index (BMI) 40.0 and over, adult: Secondary | ICD-10-CM | POA: Diagnosis not present

## 2024-03-14 DIAGNOSIS — G4710 Hypersomnia, unspecified: Secondary | ICD-10-CM | POA: Diagnosis not present

## 2024-03-14 NOTE — Telephone Encounter (Signed)
 Submitted a Prior Authorization RENEWAL request to [facility] for TALTZ  via CoverMyMeds. Will update once we receive a response.  Key: [id number]   Per automated response: Prior Authorization Not Required   Geraldene Kleine, PharmD, MPH, BCPS, CPP Clinical Pharmacist (Rheumatology and Pulmonology)

## 2024-03-15 NOTE — Progress Notes (Unsigned)
 Office Visit Note  Patient: Joseph [name]             Date of Birth: [birth year]           MRN: [id number]             PCP: Jearlean Mince, PA-C Referring: Isabelle Maple* Visit Date: 03/17/2024 Occupation: @GUAROCC @  Subjective:  Pain in right hand and knees  History of Present Illness: SAJAN MALOOF is a 43 y.o. male with psoriatic arthritis, psoriasis and osteoarthritis.  He returns today after his last visit on November 10, 2023.  He was accompanied by his wife.  He states that he continues to have discomfort in his right hand and also in his knee joints.  He also reports stiffness in the morning.  He has been taking Taltz  80 mg subcu every 28 days without any interruption.  He has not noticed any joint swelling.  He denies any dactylitis, Achilles tendinitis, plantar fasciitis.  He denies having psoriasis flare.    Activities of Daily Living:  Patient reports morning stiffness for 5-10 minutes.   Patient Denies nocturnal pain.  Difficulty dressing/grooming: Denies Difficulty climbing stairs: Denies Difficulty getting out of chair: Denies Difficulty using hands for taps, buttons, cutlery, and/or writing: Denies  Review of Systems  Constitutional:  Negative for fatigue.  HENT:  Negative for mouth sores and mouth dryness.   Eyes:  Negative for dryness.  Respiratory:  Negative for shortness of breath.   Cardiovascular:  Negative for chest pain and palpitations.  Gastrointestinal:  Negative for blood in stool, constipation and diarrhea.  Endocrine: Negative for increased urination.  Genitourinary:  Negative for involuntary urination.  Musculoskeletal:  Positive for joint pain, joint pain, joint swelling and morning stiffness. Negative for gait problem, myalgias, muscle weakness, muscle tenderness and myalgias.  Skin:  Negative for color change, rash, hair loss and sensitivity to sunlight.  Allergic/Immunologic: Negative for susceptible to infections.   Neurological:  Negative for dizziness and headaches.  Hematological:  Negative for swollen glands.  Psychiatric/Behavioral:  Negative for depressed mood and sleep disturbance. The patient is not nervous/anxious.     PMFS History:  Patient Active Problem List   Diagnosis Date Noted   Acute medial meniscus tear of right knee    Elbow dislocation, right, initial encounter 03/06/2020   Closed disp fx of right olecranon with intraartic exten w malunion 02/14/2020   Morbid obesity with BMI of 45.0-49.9, adult (HCC) 11/25/2019   Hair loss 12/31/2017   Varicose veins of both lower extremities 07/26/2017   Iron deficiency anemia 09/21/2014   Psoriatic arthritis (HCC) 09/21/2014   ESSENTIAL HYPERTENSION, MALIGNANT 02/18/2009   Nonspecific (abnormal) findings on radiological and other examination of body structure 02/16/2009   CHEST XRAY, ABNORMAL 02/16/2009    Past Medical History:  Diagnosis Date   Acute sinus infection 11/07/2014   Arthritis    Blood transfusion without reported diagnosis    At [facility]    Difficult intubation 01/13/2020   difficult airway in OR (per Dr Redge Cancel)   Iron deficiency anemia 09/21/2014   Psoriasis    Psoriatic arthritis (HCC)    Psoriatic arthritis (HCC)    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 Lavonne Prairie 02/2009 with echo, as needed f/u   Torn meniscus    right knee    Family History  Problem Relation Age of Onset  Pulmonary fibrosis Mother    Hypertension Mother    Pancreatic cancer Maternal Grandmother    Rheum arthritis Paternal Aunt    Cancer Paternal Grandmother    Breast cancer Paternal Grandmother    COPD Paternal Grandfather    CAD Paternal Grandfather    Colon cancer Neg Hx    Prostate cancer Neg Hx    Diabetes Neg Hx    Thyroid  disease Neg Hx    Liver cancer Neg Hx    Stomach cancer Neg Hx    Rectal cancer Neg Hx    Past  Surgical History:  Procedure Laterality Date   COLONOSCOPY WITH ESOPHAGOGASTRODUODENOSCOPY (EGD)     ENDOVENOUS ABLATION SAPHENOUS VEIN W/ LASER  10/16/2022   endovenous laser ablation right greater saphenous vein by Genny Kid MD   EXTERNAL FIXATION REMOVAL Right 03/22/2020   Procedure: REMOVAL EXTERNAL FIXATION ARM;  Surgeon: Laneta Pintos, MD;  Location: [facility];  Service: Orthopedics;  Laterality: Right;   HERNIA REPAIR  1994   umbicial   INNER EAR SURGERY Left    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, 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   KNEE ARTHROSCOPY Right 05/10/2021   Procedure: ARTHROSCOPY KNEE partial medial meniscectomy;  Surgeon: Jasmine Mesi, MD;  Location: [facility];  Service: Orthopedics;  Laterality: Right;   LIGAMENT REPAIR Right 01/13/2020   Procedure: COLLATERAL LIGAMENT REPAIRS;  Surgeon: Rober Chimera, MD;  Location: [facility];  Service: Orthopedics;  Laterality: Right;   ORIF ELBOW FRACTURE Right 02/17/2020   Procedure: REVISION OPEN REDUCTION INTERNAL FIXATION (ORIF) ELBOW/OLECRANON FRACTURE;  Surgeon: Laneta Pintos, MD;  Location: [facility];  Service: Orthopedics;  Laterality: Right;   ORIF ULNAR FRACTURE Right 01/13/2020   Procedure: OPEN TREATMENT OF COMPLEX RIGHT PROXIMAL ULNA FRACTURE;  Surgeon: Rober Chimera, MD;  Location: [facility];  Service: Orthopedics;  Laterality: Right;  PROCEDURE: OPEN TREATMENT OF COMPLEX RIGHT PROXIMAL ULNA FRACTURE WITH POSSIBLE COLLATERAL LIGAMENT REPAIRS LENGTH OF SURGERY: 2.5 HOURSMAC + REGIONAL BLOCK   Social History   Social History Narrative   Right handed   Caffeien use:1 soda per day   Immunization History  Administered Date(s) Administered   Influenza,inj,Quad PF,6+ Mos 11/20/2021   MMR 02/12/2001   PFIZER Comirnaty(Gray Top)Covid-19 Tri-Sucrose Vaccine 09/21/2020, 10/12/2020   PNEUMOCOCCAL CONJUGATE-20 11/20/2021   Pneumococcal Polysaccharide-23 11/10/2012   Td 08/17/2001      Objective: Vital Signs: BP 137/78 (BP Location: Left Arm, Patient Position: Sitting, Cuff Size: Normal) Comment (BP Location): Lower arm  Pulse 98   Ht 5\' 10"  (1.778 m)   Wt (!) 308 lb 9.6 oz (140 kg)   BMI 44.28 kg/m    Physical Exam Vitals and nursing note reviewed.  Constitutional:      Appearance: [name] well-developed.  HENT:     Head: Normocephalic and atraumatic.  Eyes:     Conjunctiva/sclera: Conjunctivae normal.     Pupils: Pupils are equal, round, and reactive to light.  Cardiovascular:     Rate and Rhythm: Normal rate and regular rhythm.     Heart sounds: Normal heart sounds.  Pulmonary:     Effort: Pulmonary effort is normal.     Breath sounds: Normal breath sounds.  Abdominal:     General: Bowel sounds are normal.     Palpations: Abdomen is soft.  Musculoskeletal:     Cervical back: Normal range of motion and neck supple.  Skin:    General: Skin is warm and dry.  Capillary Refill: Capillary refill takes less than 2 seconds.  Neurological:     Mental Status: [name] alert and oriented to person, place, and time.  Psychiatric:        Behavior: Behavior normal.      Musculoskeletal Exam: Cervical, thoracic and lumbar spine were in good range of motion.  Shoulders were in good range of motion.  He had right elbow contracture without synovitis.  He had some limitation with range of motion of the wrist joint without synovitis.  Bilateral third MCP thickening with no synovitis was noted.  No PIP and DIP thickening was noted.  No dactylitis was noted.  Hip joints and knee joints with good range of motion without any warmth swelling or effusion.  There was no tenderness over ankles or MTPs.  CDAI Exam: CDAI Score: -- Patient Global: --; Provider Global: -- Swollen: --; Tender: -- Joint Exam 03/17/2024   No joint exam has been documented for this visit   There is currently no information documented on the homunculus. Go to the Rheumatology activity and complete  the homunculus joint exam.  Investigation: No additional findings.  Imaging: No results found.  Recent Labs: Lab Results  Component Value Date   WBC 8.9 03/04/2024   HGB 14.4 03/04/2024   PLT 209 03/04/2024   NA 137 03/04/2024   K 3.9 03/04/2024   CL 101 03/04/2024   CO2 21 03/04/2024   GLUCOSE 113 (H) 03/04/2024   BUN 11 03/04/2024   CREATININE 1.05 03/04/2024   BILITOT 0.4 03/04/2024   ALKPHOS 113 03/04/2024   AST 13 03/04/2024   ALT 16 03/04/2024   PROT 7.5 03/04/2024   ALBUMIN  3.8 (L) 03/04/2024   CALCIUM 8.2 (L) 03/04/2024   GFRAA 112 04/19/2021   QFTBGOLD Negative 04/03/2017   QFTBGOLDPLUS Negative 03/04/2024    Speciality Comments: Prior therapy: Enbrel/Humira (inadequate response)  and Remicaide (d/c due to cost and time consuming infusion), Cosentyx  stopped 04/19/21. Talt started 05/27/21  FU q 3 months  Procedures:  No procedures performed Allergies: Sulfonamide derivatives and Bee venom   Assessment / Plan:     Visit Diagnoses: Psoriatic arthritis (HCC)-he denies having a flare of psoriatic arthritis.  He states he has been having pain and discomfort in his right hand and bilateral knee joints which is unchanged.  He denies any discomfort in his right shoulder today.  He notices some swelling in his hand.  No synovitis was noted on the examination.  No dactylitis, plantar fasciitis or Achilles tendinitis was noted.  He denies any history of uveitis.  He states he tried gabapentin  but did not notice any improvement in his symptoms.  He stopped taking gabapentin .  I did detailed discussion with the patient that the pain he is experiencing most likely is coming from underlying wear-and-tear.  Psoriasis-no active psoriasis lesions were noted.  He denies having a flare of psoriasis.  High risk medication use - Taltz  80 mg sq injections every 28 days. Previously had inadequate response to Enbrel, Humira, cosentyx  and Remicade .  March 04, 2024 CBC and CMP were normal  except calcium was low.  Use of calcium supplement was discussed.  TB Gold was negative on March 04, 2024.  He was advised to get labs every 3 months and TB Gold annually.  Information immunization was placed in the AVS.  He was advised to hold Taltz  if he develops an infection and resume when the infection resolves.  Varicose veins of both lower extremities, unspecified whether  complicated  Pain in right hand-he continues to have some discomfort in his right hand.  No synovitis was noted.  Sed rate and CRP were mildly elevated at the last visit which could be due to morbid obesity.  Primary osteoarthritis of both hands-he has osteoarthritis in his hands.  Joint protection muscle strengthening was discussed.  Primary osteoarthritis of left knee  History of torn meniscus of right knee-he continues to have some discomfort in his knee joint.  Weight loss diet and exercise was discussed.  Lower extremity muscle strengthening signs were discussed.  Chondromalacia patellae of right knee  Closed fracture of proximal end of right ulna with routine healing, unspecified fracture morphology, subsequent encounter - Previous fracture of the right proximal ulna on 12/15/2019 and underwent internal fixation by Dr. Hildy Lowers on 01/13/2020.  History of hypertension-blood pressure was 137/78 today.  History of iron deficiency anemia  BMI 40.0-44.9, adult (HCC)-weight loss diet and exercise was emphasized.  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  Orders: No orders of the defined types were placed in this encounter.  No orders of the defined types were placed in this encounter.   Follow-Up Instructions: Return in about 5 months (around 08/17/2024) for Psoriatic arthritis, Osteoarthritis.   Nicholas Bari, MD  Note - This record has been created using Animal nutritionist.  Chart creation errors have been sought, but may not always  have been located. Such creation errors do not reflect on   the standard of medical care.

## 2024-03-16 ENCOUNTER — Other Ambulatory Visit: Payer: Self-pay | Admitting: Medical

## 2024-03-17 ENCOUNTER — Ambulatory Visit: Attending: Rheumatology | Admitting: Rheumatology

## 2024-03-17 ENCOUNTER — Encounter: Payer: Self-pay | Admitting: Rheumatology

## 2024-03-17 VITALS — BP 137/78 | HR 98 | Ht 70.0 in | Wt 308.6 lb

## 2024-03-17 DIAGNOSIS — M79641 Pain in right hand: Secondary | ICD-10-CM

## 2024-03-17 DIAGNOSIS — Z79899 Other long term (current) drug therapy: Secondary | ICD-10-CM

## 2024-03-17 DIAGNOSIS — L4050 Arthropathic psoriasis, unspecified: Secondary | ICD-10-CM | POA: Diagnosis not present

## 2024-03-17 DIAGNOSIS — Z8679 Personal history of other diseases of the circulatory system: Secondary | ICD-10-CM

## 2024-03-17 DIAGNOSIS — M2241 Chondromalacia patellae, right knee: Secondary | ICD-10-CM

## 2024-03-17 DIAGNOSIS — M19041 Primary osteoarthritis, right hand: Secondary | ICD-10-CM

## 2024-03-17 DIAGNOSIS — I8393 Asymptomatic varicose veins of bilateral lower extremities: Secondary | ICD-10-CM | POA: Diagnosis not present

## 2024-03-17 DIAGNOSIS — M19042 Primary osteoarthritis, left hand: Secondary | ICD-10-CM

## 2024-03-17 DIAGNOSIS — M1712 Unilateral primary osteoarthritis, left knee: Secondary | ICD-10-CM

## 2024-03-17 DIAGNOSIS — Z87828 Personal history of other (healed) physical injury and trauma: Secondary | ICD-10-CM

## 2024-03-17 DIAGNOSIS — Z6841 Body Mass Index (BMI) 40.0 and over, adult: Secondary | ICD-10-CM

## 2024-03-17 DIAGNOSIS — L409 Psoriasis, unspecified: Secondary | ICD-10-CM | POA: Diagnosis not present

## 2024-03-17 DIAGNOSIS — M67911 Unspecified disorder of synovium and tendon, right shoulder: Secondary | ICD-10-CM

## 2024-03-17 DIAGNOSIS — Z862 Personal history of diseases of the blood and blood-forming organs and certain disorders involving the immune mechanism: Secondary | ICD-10-CM

## 2024-03-17 DIAGNOSIS — S52001D Unspecified fracture of upper end of right ulna, subsequent encounter for closed fracture with routine healing: Secondary | ICD-10-CM

## 2024-03-17 NOTE — Patient Instructions (Signed)
Standing Labs We placed an order today for your standing lab work.   Please have your standing labs drawn in July and every 3 months  Please have your labs drawn 2 weeks prior to your appointment so that the provider can discuss your lab results at your appointment, if possible.  Please note that you may see your imaging and lab results in MyChart before we have reviewed them. We will contact you once all results are reviewed. Please allow our office up to 72 hours to thoroughly review all of the results before contacting the office for clarification of your results.  WALK-IN LAB HOURS  Monday through Thursday from 8:00 am -12:30 pm and 1:00 pm-5:00 pm and Friday from 8:00 am-12:00 pm.  Patients with office visits requiring labs will be seen before walk-in labs.  You may encounter longer than normal wait times. Please allow additional time. Wait times may be shorter on  Monday and Thursday afternoons.  We do not book appointments for walk-in labs. We appreciate your patience and understanding with our staff.   Labs are drawn by [facility]. Please bring your co-pay at the time of your lab draw.  You may receive a bill from [facility] for your lab work.  Please note if you are on Hydroxychloroquine and and an order has been placed for a Hydroxychloroquine level,  you will need to have it drawn 4 hours or more after your last dose.  If you wish to have your labs drawn at another location, please call the office 24 hours in advance so we can fax the orders.  The office is located at [address], [address], [place], [place] [address]   If you have any questions regarding directions or hours of operation,  please call [phone].   As a reminder, please drink plenty of water prior to coming for your lab work. Thanks!   Vaccines You are taking a medication(s) that can suppress your immune system.  The following immunizations are recommended: Flu annually Covid-19  Td/Tdap (tetanus, diphtheria,  pertussis) every 10 years Pneumonia (Prevnar 15 then Pneumovax 23 at least 1 year apart.  Alternatively, can take Prevnar 20 without needing additional dose) Shingrix: 2 doses from 4 weeks to 6 months apart  Please check with your PCP to make sure you are up to date.   If you have signs or symptoms of an infection or start antibiotics: First, call your PCP for workup of your infection. Hold your medication through the infection, until you complete your antibiotics, and until symptoms resolve if you take the following: Injectable medication (Actemra, Benlysta, Cimzia, Cosentyx, Enbrel, Humira, Kevzara, Orencia, Remicade, Simponi, Stelara, Taltz, Tremfya) Methotrexate Leflunomide (Arava) Mycophenolate (Cellcept) Xeljanz, Olumiant, or Rinvoq  

## 2024-03-28 ENCOUNTER — Ambulatory Visit: Admitting: Physician Assistant

## 2024-04-11 ENCOUNTER — Other Ambulatory Visit: Payer: Self-pay

## 2024-04-15 ENCOUNTER — Other Ambulatory Visit: Payer: Self-pay

## 2024-04-15 ENCOUNTER — Other Ambulatory Visit: Payer: Self-pay | Admitting: Physician Assistant

## 2024-04-15 DIAGNOSIS — L4050 Arthropathic psoriasis, unspecified: Secondary | ICD-10-CM

## 2024-04-15 DIAGNOSIS — L409 Psoriasis, unspecified: Secondary | ICD-10-CM

## 2024-04-15 NOTE — Progress Notes (Signed)
 Specialty Pharmacy Refill Coordination Note  Joseph [name] is a 43 y.o. male contacted today regarding refills of specialty medication(s) Ixekizumab  (Taltz )   Patient requested Delivery   Delivery date: 05/07/24   Verified address: [address] [place] [address]   Medication will be filled on 05/06/24.   Refill request sent 04/15/24

## 2024-04-15 NOTE — Telephone Encounter (Signed)
 Last Fill: 03/08/2024  Labs: 03/04/2024 Glucose is mildly elevated, Calcium is low.  CBC is normal.   TB Gold: 03/04/2024 Neg  Next Visit: 08/26/2024  Last Visit: 03/17/2024  ZO:[id number] arthritis   Current Dose per office note 03/17/2024: Taltz  80 mg sq injections every 28 days   Okay to refill Taltz ?

## 2024-04-16 MED ORDER — TALTZ 80 MG/ML ~~LOC~~ SOAJ
80.0000 mg | SUBCUTANEOUS | 0 refills | Status: DC
Start: 2024-04-16 — End: 2024-08-29
  Filled 2024-04-18: qty 1, 28d supply, fill #0
  Filled 2024-06-06: qty 3, 84d supply, fill #0
  Filled 2024-06-07: qty 1, 28d supply, fill #0
  Filled 2024-07-04: qty 1, 28d supply, fill #1
  Filled 2024-07-27: qty 1, 28d supply, fill #2

## 2024-04-18 ENCOUNTER — Other Ambulatory Visit: Payer: Self-pay

## 2024-04-20 ENCOUNTER — Other Ambulatory Visit: Payer: Self-pay

## 2024-05-05 ENCOUNTER — Other Ambulatory Visit (HOSPITAL_COMMUNITY): Payer: Self-pay

## 2024-05-05 ENCOUNTER — Telehealth: Payer: Self-pay

## 2024-05-05 ENCOUNTER — Other Ambulatory Visit: Payer: Self-pay

## 2024-05-05 NOTE — Telephone Encounter (Signed)
 Submitted an URGENT Prior Authorization RENEWAL request to [facility] for TALTZ  via CoverMyMeds. Will update once we receive a response.  Key: [id number]

## 2024-05-05 NOTE — Progress Notes (Signed)
 PA for Taltz  approved through [facility]

## 2024-05-05 NOTE — Progress Notes (Signed)
 PA actually may have expired  Submitted an URGENT Prior Authorization RENEWAL request to [facility] for TALTZ  via CoverMyMeds. Will update once we receive a response.  Key: [id number]

## 2024-05-05 NOTE — Telephone Encounter (Signed)
 Received notification from [facility] regarding a prior authorization for TALTZ . Authorization has been APPROVED from 05/05/2024 to 05/05/2025. Approval letter sent to scan center.  Authorization # [id number]

## 2024-05-06 ENCOUNTER — Other Ambulatory Visit (HOSPITAL_COMMUNITY): Payer: Self-pay

## 2024-05-06 ENCOUNTER — Other Ambulatory Visit: Payer: Self-pay

## 2024-05-31 ENCOUNTER — Other Ambulatory Visit (HOSPITAL_COMMUNITY): Payer: Self-pay

## 2024-06-06 ENCOUNTER — Other Ambulatory Visit: Payer: Self-pay

## 2024-06-07 ENCOUNTER — Other Ambulatory Visit (HOSPITAL_COMMUNITY): Payer: Self-pay

## 2024-06-08 ENCOUNTER — Other Ambulatory Visit: Payer: Self-pay

## 2024-06-08 ENCOUNTER — Other Ambulatory Visit (HOSPITAL_COMMUNITY): Payer: Self-pay

## 2024-06-10 ENCOUNTER — Other Ambulatory Visit: Payer: Self-pay

## 2024-06-10 ENCOUNTER — Other Ambulatory Visit (HOSPITAL_COMMUNITY): Payer: Self-pay

## 2024-06-10 NOTE — Progress Notes (Signed)
 Specialty Pharmacy Refill Coordination Note  Joseph [name] is a 43 y.o. male contacted today regarding refills of specialty medication(s) Ixekizumab  (Taltz )   Patient requested [facility] at [facility] [place] date: 06/11/24   Medication will be filled on 06/10/24.

## 2024-06-13 DIAGNOSIS — M25561 Pain in right knee: Secondary | ICD-10-CM | POA: Diagnosis not present

## 2024-06-13 DIAGNOSIS — T1490XA Injury, unspecified, initial encounter: Secondary | ICD-10-CM | POA: Diagnosis not present

## 2024-06-26 ENCOUNTER — Other Ambulatory Visit: Payer: Self-pay | Admitting: Medical

## 2024-07-01 ENCOUNTER — Other Ambulatory Visit: Payer: Self-pay

## 2024-07-03 ENCOUNTER — Encounter (INDEPENDENT_AMBULATORY_CARE_PROVIDER_SITE_OTHER): Payer: Self-pay

## 2024-07-04 ENCOUNTER — Other Ambulatory Visit: Payer: Self-pay

## 2024-07-04 NOTE — Progress Notes (Signed)
 Specialty Pharmacy Refill Coordination Note  Joseph [name] is a 43 y.o. male contacted today regarding refills of specialty medication(s) Ixekizumab  (Taltz )   Patient requested (Patient-Rptd) Delivery   Delivery date: 07/05/24   Verified address: (Patient-Rptd) [address], [place] [address]   Medication will be filled on 07/04/24.

## 2024-07-09 DIAGNOSIS — M25561 Pain in right knee: Secondary | ICD-10-CM | POA: Diagnosis not present

## 2024-07-23 DIAGNOSIS — M25561 Pain in right knee: Secondary | ICD-10-CM | POA: Diagnosis not present

## 2024-07-25 ENCOUNTER — Other Ambulatory Visit: Payer: Self-pay

## 2024-07-27 ENCOUNTER — Other Ambulatory Visit: Payer: Self-pay | Admitting: Pharmacy Technician

## 2024-07-27 ENCOUNTER — Encounter (INDEPENDENT_AMBULATORY_CARE_PROVIDER_SITE_OTHER): Payer: Self-pay

## 2024-07-27 ENCOUNTER — Other Ambulatory Visit: Payer: Self-pay

## 2024-07-27 NOTE — Progress Notes (Signed)
 Specialty Pharmacy Refill Coordination Note  Joseph [name] is a 43 y.o. male contacted today regarding refills of specialty medication(s) Ixekizumab  (Taltz )   Patient requested Delivery   Delivery date: 08/03/24   Verified address: [address] [name]  [address]   Medication will be filled on 08/02/24. Injection due on 08/07/24. Questionnaire answered.

## 2024-08-05 DIAGNOSIS — M25561 Pain in right knee: Secondary | ICD-10-CM | POA: Diagnosis not present

## 2024-08-12 NOTE — Progress Notes (Deleted)
 Office Visit Note  Patient: Joseph [name]             Date of Birth: 1981-06-15           MRN: [id number]             PCP: Jacques Camie Pepper, PA-C Referring: Jacques Camie Pepper SHAUNNA* Visit Date: 08/26/2024 Occupation: Data Unavailable  Subjective:    History of Present Illness: Joseph [name] is a 43 y.o. male with history of psoriatic arthritis and osteoarthritis.  Patient remains on Taltz  80 mg sq injections every 28 days.   CBC and CMP updated on 03/04/24. Orders for CBC and CMP released today.  TB gold negative on 03/04/24  Discussed the importance of holding taltz  if he develops signs or symptoms of an infection and to resume once the infection has completely cleared.     Activities of Daily Living:  Patient reports morning stiffness for *** {minute/hour:19697}.   Patient {ACTIONS;DENIES/REPORTS:21021675::Denies} nocturnal pain.  Difficulty dressing/grooming: {ACTIONS;DENIES/REPORTS:21021675::Denies} Difficulty climbing stairs: {ACTIONS;DENIES/REPORTS:21021675::Denies} Difficulty getting out of chair: {ACTIONS;DENIES/REPORTS:21021675::Denies} Difficulty using hands for taps, buttons, cutlery, and/or writing: {ACTIONS;DENIES/REPORTS:21021675::Denies}  No Rheumatology ROS completed.   PMFS History:  Patient Active Problem List   Diagnosis Date Noted   Acute medial meniscus tear of right knee    Elbow dislocation, right, initial encounter 03/06/2020   Closed disp fx of right olecranon with intraartic exten w malunion 02/14/2020   Morbid obesity with BMI of 45.0-49.9, adult (HCC) 11/25/2019   Hair loss 12/31/2017   Varicose veins of both lower extremities 07/26/2017   Iron deficiency anemia 09/21/2014   Psoriatic arthritis (HCC) 09/21/2014   ESSENTIAL HYPERTENSION, MALIGNANT 02/18/2009   Nonspecific (abnormal) findings on radiological and other examination of body structure 02/16/2009   CHEST XRAY, ABNORMAL 02/16/2009    Past Medical History:  Diagnosis  Date   Acute sinus infection 11/07/2014   Arthritis    Blood transfusion without reported diagnosis    At [facility]    Difficult intubation 01/13/2020   difficult airway in OR (per Dr Auston)   Iron deficiency anemia 09/21/2014   Psoriasis    Psoriatic arthritis (HCC)    Psoriatic arthritis (HCC)    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 Lavona 02/2009 with echo, as needed f/u   Torn meniscus    right knee    Family History  Problem Relation Age of Onset   Pulmonary fibrosis Mother    Hypertension Mother    Pancreatic cancer Maternal Grandmother    Rheum arthritis Paternal Aunt    Cancer Paternal Grandmother    Breast cancer Paternal Grandmother    COPD Paternal Grandfather    CAD Paternal Grandfather    Colon cancer Neg Hx    Prostate cancer Neg Hx    Diabetes Neg Hx    Thyroid  disease Neg Hx    Liver cancer Neg Hx    Stomach cancer Neg Hx    Rectal cancer Neg Hx    Past Surgical History:  Procedure Laterality Date   COLONOSCOPY WITH ESOPHAGOGASTRODUODENOSCOPY (EGD)     ENDOVENOUS ABLATION SAPHENOUS VEIN W/ LASER  10/16/2022   endovenous laser ablation right greater saphenous vein by Gaile New MD   EXTERNAL FIXATION REMOVAL Right 03/22/2020   Procedure: REMOVAL EXTERNAL FIXATION ARM;  Surgeon: Kendal Franky SHAUNNA, MD;  Location: [facility] OR;  Service: Orthopedics;  Laterality: Right;   HERNIA  REPAIR  1994   umbicial   INNER EAR SURGERY Left    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, 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   KNEE ARTHROSCOPY Right 05/10/2021   Procedure: ARTHROSCOPY KNEE partial medial meniscectomy;  Surgeon: Addie Cordella Hamilton, MD;  Location: [facility];  Service: Orthopedics;  Laterality: Right;   LIGAMENT REPAIR Right 01/13/2020   Procedure: COLLATERAL LIGAMENT REPAIRS;  Surgeon: Sebastian Lenis, MD;  Location: [facility];  Service:  Orthopedics;  Laterality: Right;   ORIF ELBOW FRACTURE Right 02/17/2020   Procedure: REVISION OPEN REDUCTION INTERNAL FIXATION (ORIF) ELBOW/OLECRANON FRACTURE;  Surgeon: Kendal Franky SQUIBB, MD;  Location: [facility] OR;  Service: Orthopedics;  Laterality: Right;   ORIF ULNAR FRACTURE Right 01/13/2020   Procedure: OPEN TREATMENT OF COMPLEX RIGHT PROXIMAL ULNA FRACTURE;  Surgeon: Sebastian Lenis, MD;  Location: [facility];  Service: Orthopedics;  Laterality: Right;  PROCEDURE: OPEN TREATMENT OF COMPLEX RIGHT PROXIMAL ULNA FRACTURE WITH POSSIBLE COLLATERAL LIGAMENT REPAIRS LENGTH OF SURGERY: 2.5 HOURSMAC + REGIONAL BLOCK   Social History   Tobacco Use   Smoking status: Never    Passive exposure: Never   Smokeless tobacco: Never  Vaping Use   Vaping status: Never Used  Substance Use Topics   Alcohol use: No   Drug use: No   Social History   Social History Narrative   Right handed   Caffeien use:1 soda per day     Immunization History  Administered Date(s) Administered   Influenza,inj,Quad PF,6+ Mos 11/20/2021   MMR 02/12/2001   PFIZER Comirnaty(Gray Top)Covid-19 Tri-Sucrose Vaccine 09/21/2020, 10/12/2020   PNEUMOCOCCAL CONJUGATE-20 11/20/2021   Pneumococcal Polysaccharide-23 11/10/2012   Td 08/17/2001     Objective: Vital Signs: There were no vitals taken for this visit.   Physical Exam Vitals and nursing note reviewed.  Constitutional:      Appearance: [name] well-developed.  HENT:     Head: Normocephalic and atraumatic.  Eyes:     Conjunctiva/sclera: Conjunctivae normal.     Pupils: Pupils are equal, round, and reactive to light.  Cardiovascular:     Rate and Rhythm: Normal rate and regular rhythm.     Heart sounds: Normal heart sounds.  Pulmonary:     Effort: Pulmonary effort is normal.     Breath sounds: Normal breath sounds.  Abdominal:     General: Bowel sounds are normal.     Palpations: Abdomen is soft.  Musculoskeletal:     Cervical back: Normal range  of motion and neck supple.  Skin:    General: Skin is warm and dry.     Capillary Refill: Capillary refill takes less than 2 seconds.  Neurological:     Mental Status: [name] alert and oriented to person, place, and time.  Psychiatric:        Behavior: Behavior normal.      Musculoskeletal Exam: ***  CDAI Exam: CDAI Score: -- Patient Global: --; Provider Global: -- Swollen: --; Tender: -- Joint Exam 08/26/2024   No joint exam has been documented for this visit   There is currently no information documented on the homunculus. Go to the Rheumatology activity and complete the homunculus joint exam.  Investigation: No additional findings.  Imaging: No results found.  Recent Labs: Lab Results  Component Value Date   WBC 8.9 03/04/2024   HGB 14.4 03/04/2024   PLT 209 03/04/2024   NA 137 03/04/2024   K 3.9 03/04/2024   CL 101 03/04/2024   CO2  21 03/04/2024   GLUCOSE 113 (H) 03/04/2024   BUN 11 03/04/2024   CREATININE 1.05 03/04/2024   BILITOT 0.4 03/04/2024   ALKPHOS 113 03/04/2024   AST 13 03/04/2024   ALT 16 03/04/2024   PROT 7.5 03/04/2024   ALBUMIN  3.8 (L) 03/04/2024   CALCIUM 8.2 (L) 03/04/2024   GFRAA 112 04/19/2021   QFTBGOLD Negative 04/03/2017   QFTBGOLDPLUS Negative 03/04/2024    Speciality Comments: Prior therapy: Enbrel/Humira (inadequate response)  and Remicaide (d/c due to cost and time consuming infusion), Cosentyx  stopped 04/19/21. Talt started 05/27/21  FU q 3 months  Procedures:  No procedures performed Allergies: Sulfonamide derivatives and Bee venom   Assessment / Plan:     Visit Diagnoses: Psoriatic arthritis (HCC)  Psoriasis  High risk medication use  Varicose veins of both lower extremities, unspecified whether complicated  Pain in right hand  Primary osteoarthritis of both hands  Primary osteoarthritis of left knee  History of torn meniscus of right knee  Chondromalacia patellae of right knee  Closed fracture of proximal  end of right ulna with routine healing, unspecified fracture morphology, subsequent encounter  History of hypertension  History of iron deficiency anemia  Orders: No orders of the defined types were placed in this encounter.  No orders of the defined types were placed in this encounter.   Face-to-face time spent with patient was *** minutes. Greater than 50% of time was spent in counseling and coordination of care.  Follow-Up Instructions: No follow-ups on file.   Waddell CHRISTELLA Craze, PA-C  Note - This record has been created using Dragon software.  Chart creation errors have been sought, but may not always  have been located. Such creation errors do not reflect on  the standard of medical care.

## 2024-08-16 ENCOUNTER — Other Ambulatory Visit: Payer: Self-pay

## 2024-08-17 ENCOUNTER — Other Ambulatory Visit: Payer: Self-pay

## 2024-08-19 ENCOUNTER — Ambulatory Visit: Admitting: Physician Assistant

## 2024-08-23 ENCOUNTER — Other Ambulatory Visit: Payer: Self-pay | Admitting: Rheumatology

## 2024-08-23 ENCOUNTER — Encounter (INDEPENDENT_AMBULATORY_CARE_PROVIDER_SITE_OTHER): Payer: Self-pay

## 2024-08-23 ENCOUNTER — Other Ambulatory Visit (HOSPITAL_COMMUNITY): Payer: Self-pay

## 2024-08-23 ENCOUNTER — Other Ambulatory Visit: Payer: Self-pay

## 2024-08-23 DIAGNOSIS — L4050 Arthropathic psoriasis, unspecified: Secondary | ICD-10-CM

## 2024-08-23 DIAGNOSIS — L409 Psoriasis, unspecified: Secondary | ICD-10-CM

## 2024-08-23 NOTE — Progress Notes (Signed)
 Specialty Pharmacy Refill Coordination Note  Joseph [name] is a 43 y.o. male contacted today regarding refills of specialty medication(s) Ixekizumab  (Taltz )   Patient requested Delivery   Delivery date: 08/26/24   [address] [place] [address]   Medication will be filled on 08/25/24.

## 2024-08-24 ENCOUNTER — Other Ambulatory Visit: Payer: Self-pay

## 2024-08-24 ENCOUNTER — Other Ambulatory Visit (HOSPITAL_COMMUNITY): Payer: Self-pay

## 2024-08-26 ENCOUNTER — Other Ambulatory Visit: Payer: Self-pay | Admitting: Rheumatology

## 2024-08-26 ENCOUNTER — Other Ambulatory Visit: Payer: Self-pay

## 2024-08-26 ENCOUNTER — Ambulatory Visit: Admitting: Physician Assistant

## 2024-08-26 DIAGNOSIS — M2241 Chondromalacia patellae, right knee: Secondary | ICD-10-CM

## 2024-08-26 DIAGNOSIS — Z862 Personal history of diseases of the blood and blood-forming organs and certain disorders involving the immune mechanism: Secondary | ICD-10-CM

## 2024-08-26 DIAGNOSIS — Z79899 Other long term (current) drug therapy: Secondary | ICD-10-CM

## 2024-08-26 DIAGNOSIS — S52001D Unspecified fracture of upper end of right ulna, subsequent encounter for closed fracture with routine healing: Secondary | ICD-10-CM

## 2024-08-26 DIAGNOSIS — Z87828 Personal history of other (healed) physical injury and trauma: Secondary | ICD-10-CM

## 2024-08-26 DIAGNOSIS — L409 Psoriasis, unspecified: Secondary | ICD-10-CM

## 2024-08-26 DIAGNOSIS — L4050 Arthropathic psoriasis, unspecified: Secondary | ICD-10-CM

## 2024-08-26 DIAGNOSIS — M79641 Pain in right hand: Secondary | ICD-10-CM

## 2024-08-26 DIAGNOSIS — M19041 Primary osteoarthritis, right hand: Secondary | ICD-10-CM

## 2024-08-26 DIAGNOSIS — I8393 Asymptomatic varicose veins of bilateral lower extremities: Secondary | ICD-10-CM

## 2024-08-26 DIAGNOSIS — Z8679 Personal history of other diseases of the circulatory system: Secondary | ICD-10-CM

## 2024-08-26 DIAGNOSIS — M1712 Unilateral primary osteoarthritis, left knee: Secondary | ICD-10-CM

## 2024-08-26 NOTE — Progress Notes (Signed)
 Refills were denied on first request, stating that refill would be sent after labs/appointment 08/26/24. Patient rescheduled appointment for 08/29/24. Request was re-sent.

## 2024-08-26 NOTE — Progress Notes (Unsigned)
 Office Visit Note  Patient: Joseph Joseph [name]             Date of Birth: Jul 17, 1981           MRN: [id number]             PCP: Jacques Camie Pepper, PA-C Referring: Jacques Camie Pepper SHAUNNA* Visit Date: 08/29/2024 Occupation: Data Unavailable  Subjective:  Medication monitoring  History of Present Illness: Joseph Joseph [name] is a 43 y.o. male with history of psoriatic arthritis and osteoarthritis.  Patient remains on Taltz  80 mg sq injections every 28 days.  He denies any gaps in therapy.  He denies any signs or symptoms of a flare recently.  He has not experienced any SI joint pain at this time.  He denies any Achilles tendinitis or plantar fasciitis.  He denies any joint swelling at this time.  He has not had any difficulty performing ADLs.  He denies any nocturnal pain.  He denies any active psoriasis at this time.  He takes Tylenol  as needed for pain relief. He denies any recent or recurrent infections.  He denies any new concerns.       Activities of Daily Living:  Patient reports morning stiffness for 10-15 minutes.   Patient Denies nocturnal pain.  Difficulty dressing/grooming: Denies Difficulty climbing stairs: Denies Difficulty getting out of chair: Denies Difficulty using hands for taps, buttons, cutlery, and/or writing: Denies  Review of Systems  Constitutional:  Negative for fatigue.  HENT:  Negative for mouth sores and mouth dryness.   Eyes:  Negative for dryness.  Respiratory:  Negative for shortness of breath.   Cardiovascular:  Negative for chest pain and palpitations.  Gastrointestinal:  Negative for blood in stool, constipation and diarrhea.  Endocrine: Negative for increased urination.  Genitourinary:  Negative for involuntary urination.  Musculoskeletal:  Positive for morning stiffness. Negative for joint pain, gait problem, joint pain, joint swelling, myalgias, muscle weakness, muscle tenderness and myalgias.  Skin:  Negative for color change, rash, Joseph [name] loss  and sensitivity to sunlight.  Allergic/Immunologic: Negative for susceptible to infections.  Neurological:  Negative for dizziness and headaches.  Hematological:  Negative for swollen glands.  Psychiatric/Behavioral:  Negative for depressed mood and sleep disturbance. The patient is not nervous/anxious.     PMFS History:  Patient Active Problem List   Diagnosis Date Noted   Acute medial meniscus tear of right knee    Elbow dislocation, right, initial encounter 03/06/2020   Closed disp fx of right olecranon with intraartic exten w malunion 02/14/2020   Morbid obesity with BMI of 45.0-49.9, adult (HCC) 11/25/2019   Joseph [name] loss 12/31/2017   Varicose veins of both lower extremities 07/26/2017   Iron deficiency anemia 09/21/2014   Psoriatic arthritis (HCC) 09/21/2014   ESSENTIAL HYPERTENSION, MALIGNANT 02/18/2009   Nonspecific (abnormal) findings on radiological and other examination of body structure 02/16/2009   CHEST XRAY, ABNORMAL 02/16/2009    Past Medical History:  Diagnosis Date   Acute sinus infection 11/07/2014   Arthritis    Blood transfusion without reported diagnosis    At [facility]    Difficult intubation 01/13/2020   difficult airway in OR (per Dr Auston)   Iron deficiency anemia 09/21/2014   Psoriasis    Psoriatic arthritis (HCC)    Psoriatic arthritis (HCC)    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 Lavona 02/2009 with echo, as needed  f/u   Torn meniscus    right knee    Family History  Problem Relation Age of Onset   Pulmonary fibrosis Mother    Hypertension Mother    Pancreatic cancer Maternal Grandmother    Rheum arthritis Paternal Aunt    Cancer Paternal Grandmother    Breast cancer Paternal Grandmother    COPD Paternal Grandfather    CAD Paternal Grandfather    Colon cancer Neg Hx    Prostate cancer Neg Hx    Diabetes Neg Hx    Thyroid  disease  Neg Hx    Liver cancer Neg Hx    Stomach cancer Neg Hx    Rectal cancer Neg Hx    Past Surgical History:  Procedure Laterality Date   COLONOSCOPY WITH ESOPHAGOGASTRODUODENOSCOPY (EGD)     ENDOVENOUS ABLATION SAPHENOUS VEIN W/ LASER  10/16/2022   endovenous laser ablation right greater saphenous vein by Gaile New MD   EXTERNAL FIXATION REMOVAL Right 03/22/2020   Procedure: REMOVAL EXTERNAL FIXATION ARM;  Surgeon: Kendal Franky SQUIBB, MD;  Location: [facility] OR;  Service: Orthopedics;  Laterality: Right;   HERNIA REPAIR  1994   umbicial   INNER EAR SURGERY Left    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, 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   KNEE ARTHROSCOPY Right 05/10/2021   Procedure: ARTHROSCOPY KNEE partial medial meniscectomy;  Surgeon: Addie Cordella Hamilton, MD;  Location: [facility];  Service: Orthopedics;  Laterality: Right;   LIGAMENT REPAIR Right 01/13/2020   Procedure: COLLATERAL LIGAMENT REPAIRS;  Surgeon: Sebastian Lenis, MD;  Location: [facility];  Service: Orthopedics;  Laterality: Right;   ORIF ELBOW FRACTURE Right 02/17/2020   Procedure: REVISION OPEN REDUCTION INTERNAL FIXATION (ORIF) ELBOW/OLECRANON FRACTURE;  Surgeon: Kendal Franky SQUIBB, MD;  Location: [facility] OR;  Service: Orthopedics;  Laterality: Right;   ORIF ULNAR FRACTURE Right 01/13/2020   Procedure: OPEN TREATMENT OF COMPLEX RIGHT PROXIMAL ULNA FRACTURE;  Surgeon: Sebastian Lenis, MD;  Location: [facility];  Service: Orthopedics;  Laterality: Right;  PROCEDURE: OPEN TREATMENT OF COMPLEX RIGHT PROXIMAL ULNA FRACTURE WITH POSSIBLE COLLATERAL LIGAMENT REPAIRS LENGTH OF SURGERY: 2.5 HOURSMAC + REGIONAL BLOCK   Social History   Tobacco Use   Smoking status: Never    Passive exposure: Never   Smokeless tobacco: Never  Vaping Use   Vaping status: Never Used  Substance Use Topics   Alcohol use: No   Drug use: No   Social History   Social History Narrative   Right handed   Caffeien use:1 soda per day      Immunization History  Administered Date(s) Administered   Influenza,inj,Quad PF,6+ Mos 11/20/2021   MMR 02/12/2001   PFIZER Comirnaty(Gray Top)Covid-19 Tri-Sucrose Vaccine 09/21/2020, 10/12/2020   PNEUMOCOCCAL CONJUGATE-20 11/20/2021   Pneumococcal Polysaccharide-23 11/10/2012   Td 08/17/2001     Objective: Vital Signs: BP 131/86   Pulse 73   Temp (!) 97.5 F (36.4 C)   Resp 14   Ht 5' 10 (1.778 m)   Wt (!) 309 lb 9.6 oz (140.4 kg)   BMI 44.42 kg/m    Physical Exam Vitals and nursing note reviewed.  Constitutional:      Appearance: [name] well-developed.  HENT:     Head: Normocephalic and atraumatic.  Eyes:     Conjunctiva/sclera: Conjunctivae normal.     Pupils: Pupils are equal, round, and reactive to light.  Cardiovascular:     Rate and Rhythm: Normal rate and regular rhythm.  Heart sounds: Normal heart sounds.  Pulmonary:     Effort: Pulmonary effort is normal.     Breath sounds: Normal breath sounds.  Abdominal:     General: Bowel sounds are normal.     Palpations: Abdomen is soft.  Musculoskeletal:     Cervical back: Normal range of motion and neck supple.  Skin:    General: Skin is warm and dry.     Capillary Refill: Capillary refill takes less than 2 seconds.  Neurological:     Mental Status: [name] alert and oriented to person, place, and time.  Psychiatric:        Behavior: Behavior normal.      Musculoskeletal Exam: C-spine, thoracic spine, lumbar spine have good range of motion.  No midline spinal tenderness.  No SI joint tenderness.  Shoulder joints have good range of motion with no discomfort.  Flexion contracture of the right elbow.  Limited range of motion of both wrist joints.  Inflammation noted in the right first PIP joint.  Thickening of the left 3rd and 4th MCP joints.  Knee joints have good range of motion with no warmth or effusion.  Ankle joints have good range of motion with no tenderness or joint swelling.  Pedal edema noted in  bilateral lower extremities.  No evidence of Achilles tendinitis.  CDAI Exam: CDAI Score: -- Patient Global: --; Provider Global: -- Swollen: --; Tender: -- Joint Exam 08/29/2024   No joint exam has been documented for this visit   There is currently no information documented on the homunculus. Go to the Rheumatology activity and complete the homunculus joint exam.  Investigation: No additional findings.  Imaging: No results found.  Recent Labs: Lab Results  Component Value Date   WBC 8.9 03/04/2024   HGB 14.4 03/04/2024   PLT 209 03/04/2024   NA 137 03/04/2024   K 3.9 03/04/2024   CL 101 03/04/2024   CO2 21 03/04/2024   GLUCOSE 113 (H) 03/04/2024   BUN 11 03/04/2024   CREATININE 1.05 03/04/2024   BILITOT 0.4 03/04/2024   ALKPHOS 113 03/04/2024   AST 13 03/04/2024   ALT 16 03/04/2024   PROT 7.5 03/04/2024   ALBUMIN  3.8 (L) 03/04/2024   CALCIUM 8.2 (L) 03/04/2024   GFRAA 112 04/19/2021   QFTBGOLD Negative 04/03/2017   QFTBGOLDPLUS Negative 03/04/2024    Speciality Comments: Prior therapy: Enbrel/Humira (inadequate response)  and Remicaide (d/c due to cost and time consuming infusion), Cosentyx  stopped 04/19/21. Talt started 05/27/21  FU q 3 months  Procedures:  No procedures performed Allergies: Sulfonamide derivatives and Bee venom   Assessment / Plan:     Visit Diagnoses: Psoriatic arthritis (HCC) -He has not had any signs or symptoms of a flare.  He has clinically been doing well on Taltz  80 mg subcutaneous injections once every 28 days.  He has not had any recent gaps in therapy.  He has no active psoriasis at this time.  No evidence of Achilles tendinitis or plantar fasciitis.  No SI joint tenderness upon palpation.  On examination he has inflammation of the right first PIP joint but no tenderness was noted.  Discussed that he can apply Voltaren  gel topically to help alleviate the inflammation.  [name] due for his next dose of Taltz  on Sunday.  A refill of Taltz   was sent to the pharmacy today.  He was advised to notify us  if he develops any signs or symptoms of a flare.  He will follow-up in  the office in 5 months or sooner if needed.  Plan: ixekizumab  (TALTZ ) 80 MG/ML pen  Psoriasis -He has no active psoriasis on examination.  He will remain on Taltz  80 mg sq injections once every 28 days.  He has a prescription for clobetasol  0.05% cream which he can apply topically as needed.  He does not need a refill at this time.  Plan: ixekizumab  (TALTZ ) 80 MG/ML pen  High risk medication use - Taltz  80 mg sq injections every 28 days. Previously had inadequate response to Enbrel, Humira, cosentyx  and Remicade .  CBC and CMP updated on 03/04/24. Orders for CBC and CMP released today.  His next lab work will be due in January and every 3 months to monitor for drug toxicity. TB gold negative on 03/04/24  No recent or recurrent infections.  Discussed the importance of holding taltz  if he develops signs or symptoms of an infection and to resume once the infection has completely cleared.  - Plan: CBC with Differential/Platelet, Comprehensive metabolic panel with GFR  Primary osteoarthritis of both hands: Inflammation noted in the right 1st PIP joint-discussed the use of voltaren  gel, which he can apply topically as needed. Advised the patient to notify us  if his symptoms persist or worsen.   Primary osteoarthritis of left knee: No warmth or effusion noted.   History of torn meniscus of right knee: He recently twisted his right knee and was evaluated by orthopedics.  He had an updated MRI but did not require surgical intervention since him symptoms improved after about 1 month.   Chondromalacia patellae of right knee: No warmth or effusion noted.   Other medical conditions are listed as follows:  Closed fracture of proximal end of right ulna with routine healing, unspecified fracture morphology, subsequent encounter - - Previous fracture of the right proximal ulna on  12/15/2019 and underwent internal fixation by Dr. Sebastian on 01/13/2020.  History of hypertension: Blood pressure was 131/86 today in the office.  History of iron deficiency anemia  Varicose veins of both lower extremities, unspecified whether complicated  Orders: Orders Placed This Encounter  Procedures   CBC with Differential/Platelet   Comprehensive metabolic panel with GFR   Meds ordered this encounter  Medications   ixekizumab  (TALTZ ) 80 MG/ML pen    Sig: Inject 80 mg into the skin every 28 (twenty-eight) days.    Dispense:  3 mL    Refill:  0    Prescription Type::   Renewal     Follow-Up Instructions: Return in about 5 months (around 01/27/2025) for Psoriatic arthritis.   Waddell CHRISTELLA Craze, PA-C  Note - This record has been created using Dragon software.  Chart creation errors have been sought, but may not always  have been located. Such creation errors do not reflect on  the standard of medical care.

## 2024-08-29 ENCOUNTER — Other Ambulatory Visit: Payer: Self-pay

## 2024-08-29 ENCOUNTER — Ambulatory Visit: Attending: Physician Assistant | Admitting: Physician Assistant

## 2024-08-29 ENCOUNTER — Encounter: Payer: Self-pay | Admitting: Physician Assistant

## 2024-08-29 VITALS — BP 131/86 | HR 73 | Temp 97.5°F | Resp 14 | Ht 70.0 in | Wt 309.6 lb

## 2024-08-29 DIAGNOSIS — Z87828 Personal history of other (healed) physical injury and trauma: Secondary | ICD-10-CM

## 2024-08-29 DIAGNOSIS — M19042 Primary osteoarthritis, left hand: Secondary | ICD-10-CM

## 2024-08-29 DIAGNOSIS — I8393 Asymptomatic varicose veins of bilateral lower extremities: Secondary | ICD-10-CM

## 2024-08-29 DIAGNOSIS — Z79899 Other long term (current) drug therapy: Secondary | ICD-10-CM | POA: Diagnosis not present

## 2024-08-29 DIAGNOSIS — Z862 Personal history of diseases of the blood and blood-forming organs and certain disorders involving the immune mechanism: Secondary | ICD-10-CM

## 2024-08-29 DIAGNOSIS — S52001D Unspecified fracture of upper end of right ulna, subsequent encounter for closed fracture with routine healing: Secondary | ICD-10-CM

## 2024-08-29 DIAGNOSIS — L409 Psoriasis, unspecified: Secondary | ICD-10-CM

## 2024-08-29 DIAGNOSIS — L4050 Arthropathic psoriasis, unspecified: Secondary | ICD-10-CM | POA: Diagnosis not present

## 2024-08-29 DIAGNOSIS — M1712 Unilateral primary osteoarthritis, left knee: Secondary | ICD-10-CM

## 2024-08-29 DIAGNOSIS — M79641 Pain in right hand: Secondary | ICD-10-CM

## 2024-08-29 DIAGNOSIS — M2241 Chondromalacia patellae, right knee: Secondary | ICD-10-CM

## 2024-08-29 DIAGNOSIS — Z8679 Personal history of other diseases of the circulatory system: Secondary | ICD-10-CM

## 2024-08-29 DIAGNOSIS — M19041 Primary osteoarthritis, right hand: Secondary | ICD-10-CM

## 2024-08-29 LAB — CBC WITH DIFFERENTIAL/PLATELET
Absolute Lymphocytes: 1707 {cells}/uL (ref 850–3900)
Absolute Monocytes: 616 {cells}/uL (ref 200–950)
Basophils Absolute: 53 {cells}/uL (ref 0–200)
Basophils Relative: 0.6 %
Eosinophils Absolute: 114 {cells}/uL (ref 15–500)
Eosinophils Relative: 1.3 %
HCT: 47.8 % (ref 38.5–50.0)
Hemoglobin: 15.1 g/dL (ref 13.2–17.1)
MCH: 26.5 pg — ABNORMAL LOW (ref 27.0–33.0)
MCHC: 31.6 g/dL — ABNORMAL LOW (ref 32.0–36.0)
MCV: 83.9 fL (ref 80.0–100.0)
MPV: 11.5 fL (ref 7.5–12.5)
Monocytes Relative: 7 %
Neutro Abs: 6310 {cells}/uL (ref 1500–7800)
Neutrophils Relative %: 71.7 %
Platelets: 210 Thousand/uL (ref 140–400)
RBC: 5.7 Million/uL (ref 4.20–5.80)
RDW: 14.5 % (ref 11.0–15.0)
Total Lymphocyte: 19.4 %
WBC: 8.8 Thousand/uL (ref 3.8–10.8)

## 2024-08-29 LAB — COMPREHENSIVE METABOLIC PANEL WITH GFR
AG Ratio: 1 (calc) (ref 1.0–2.5)
ALT: 15 U/L (ref 9–46)
AST: 16 U/L (ref 10–40)
Albumin: 3.8 g/dL (ref 3.6–5.1)
Alkaline phosphatase (APISO): 96 U/L (ref 36–130)
BUN: 12 mg/dL (ref 7–25)
CO2: 26 mmol/L (ref 20–32)
Calcium: 8.6 mg/dL (ref 8.6–10.3)
Chloride: 103 mmol/L (ref 98–110)
Creat: 1.01 mg/dL (ref 0.60–1.29)
Globulin: 3.7 g/dL (ref 1.9–3.7)
Glucose, Bld: 104 mg/dL — ABNORMAL HIGH (ref 65–99)
Potassium: 4 mmol/L (ref 3.5–5.3)
Sodium: 135 mmol/L (ref 135–146)
Total Bilirubin: 0.6 mg/dL (ref 0.2–1.2)
Total Protein: 7.5 g/dL (ref 6.1–8.1)
eGFR: 96 mL/min/1.73m2 (ref >=60)

## 2024-08-29 MED ORDER — TALTZ 80 MG/ML ~~LOC~~ SOAJ
80.0000 mg | SUBCUTANEOUS | 0 refills | Status: DC
  Filled 2024-08-29: qty 1, 28d supply, fill #0
  Filled 2024-09-22: qty 1, 28d supply, fill #1
  Filled 2024-10-21: qty 1, 28d supply, fill #2

## 2024-08-29 NOTE — Patient Instructions (Signed)
 Voltaren  gel     Standing Labs We placed an order today for your standing lab work.   Please have your standing labs drawn in [facility] and every 3 months   Please have your labs drawn 2 weeks prior to your appointment so that the provider can discuss your lab results at your appointment, if possible.  Please note that you may see your imaging and lab results in MyChart before we have reviewed them. We will contact you once all results are reviewed. Please allow our office up to 72 hours to thoroughly review all of the results before contacting the office for clarification of your results.  WALK-IN LAB HOURS  Monday through Thursday from 8:00 am -12:30 pm and 1:00 pm-4:30 pm and Friday from 8:00 am-12:00 pm.  Patients with office visits requiring labs will be seen before walk-in labs.  You may encounter longer than normal wait times. Please allow additional time. Wait times may be shorter on  Monday and Thursday afternoons.  We do not book appointments for walk-in labs. We appreciate your patience and understanding with our staff.   Labs are drawn by [facility]. Please bring your co-pay at the time of your lab draw.  You may receive a bill from [facility] for your lab work.  Please note if you are on Hydroxychloroquine and and an order has been placed for a Hydroxychloroquine level,  you will need to have it drawn 4 hours or more after your last dose.  If you wish to have your labs drawn at another location, please call the office 24 hours in advance so we can fax the orders.  The office is located at [address], [place], [place] [address]   If you have any questions regarding directions or hours of operation,  please call [phone].   As a reminder, please drink plenty of water  prior to coming for your lab work. Thanks!

## 2024-08-30 ENCOUNTER — Ambulatory Visit: Payer: Self-pay | Admitting: Physician Assistant

## 2024-08-30 NOTE — Progress Notes (Signed)
 CMP WNL MCH and MCHC are borderline low, rest of CBC WNL.  We will continue to monitor.

## 2024-09-12 ENCOUNTER — Encounter: Payer: Self-pay | Admitting: Radiology

## 2024-09-22 ENCOUNTER — Other Ambulatory Visit: Payer: Self-pay

## 2024-09-23 ENCOUNTER — Other Ambulatory Visit: Payer: Self-pay

## 2024-09-23 ENCOUNTER — Other Ambulatory Visit (HOSPITAL_COMMUNITY): Payer: Self-pay

## 2024-09-23 ENCOUNTER — Encounter (INDEPENDENT_AMBULATORY_CARE_PROVIDER_SITE_OTHER): Payer: Self-pay

## 2024-09-23 NOTE — Progress Notes (Signed)
 Specialty Pharmacy Refill Coordination Note  MyChart Questionnaire Submission  Joseph [name] is a 43 y.o. male contacted today regarding refills of specialty medication(s) Taltz .  Doses on hand: (Patient-Rptd) 0   Injection date: (Patient-Rptd) 10/02/24  Patient requested: (Patient-Rptd) Delivery   Delivery date: 09/27/24  Verified address: [address] [place] [address]  Medication will be filled on 09/26/24.

## 2024-09-24 ENCOUNTER — Other Ambulatory Visit: Payer: Self-pay | Admitting: Medical

## 2024-09-26 ENCOUNTER — Other Ambulatory Visit: Payer: Self-pay

## 2024-10-21 ENCOUNTER — Other Ambulatory Visit: Payer: Self-pay

## 2024-10-24 ENCOUNTER — Other Ambulatory Visit: Payer: Self-pay

## 2024-10-24 ENCOUNTER — Telehealth: Payer: Self-pay | Admitting: Pharmacist

## 2024-10-24 ENCOUNTER — Other Ambulatory Visit (HOSPITAL_COMMUNITY): Payer: Self-pay

## 2024-10-24 DIAGNOSIS — L4050 Arthropathic psoriasis, unspecified: Secondary | ICD-10-CM

## 2024-10-24 DIAGNOSIS — L409 Psoriasis, unspecified: Secondary | ICD-10-CM

## 2024-10-24 MED ORDER — TALTZ 80 MG/ML ~~LOC~~ SOAJ: 80.0000 mg | SUBCUTANEOUS | 0 refills | Status: DC

## 2024-10-24 NOTE — Progress Notes (Signed)
 Specialty Pharmacy Refill Coordination Note  Joseph [name] is a 43 y.o. male contacted today regarding refills of specialty medication(s) Ixekizumab  (Taltz )   Patient requested Delivery   Delivery date: 10/25/24   Verified address: [address]  [place] [address]   Medication will be filled on: 10/24/24

## 2024-10-24 NOTE — Telephone Encounter (Signed)
 Received fax from [employer]. Patient must fill Taltz  with [facility] moving forward. Disenrolled from [facility] follow-up  Rx resent to [facility] today.  Pharmacy phone: [phone]  MyChart message sent to patient  Sherry Pennant, PharmD, MPH, BCPS, CPP Clinical Pharmacist

## 2024-11-08 ENCOUNTER — Telehealth: Payer: Self-pay | Admitting: Pharmacist

## 2024-11-08 ENCOUNTER — Other Ambulatory Visit: Payer: Self-pay

## 2024-11-08 DIAGNOSIS — L409 Psoriasis, unspecified: Secondary | ICD-10-CM

## 2024-11-08 DIAGNOSIS — L4050 Arthropathic psoriasis, unspecified: Secondary | ICD-10-CM

## 2024-11-08 MED ORDER — TALTZ 80 MG/ML ~~LOC~~ SOAJ
80.0000 mg | SUBCUTANEOUS | 0 refills | Status: AC
  Filled 2024-11-08: qty 1, 28d supply, fill #0

## 2024-11-08 NOTE — Telephone Encounter (Signed)
 Per Rai at [facility], need active rx to run test claim on 11/14/2024 to see if patient can continue filling with pharmacy or if he has to switch to [facility]  Rx for one fill sent.  Patient due for labs in late Jan 2026  Sherry Pennant, PharmD, MPH, BCPS, CPP Clinical Pharmacist

## 2024-11-11 ENCOUNTER — Other Ambulatory Visit: Payer: Self-pay

## 2024-11-14 ENCOUNTER — Other Ambulatory Visit: Payer: Self-pay

## 2024-11-14 ENCOUNTER — Other Ambulatory Visit (HOSPITAL_COMMUNITY): Payer: Self-pay

## 2024-11-16 NOTE — Telephone Encounter (Signed)
 Patient must fill with [facility]. Rx was already sent. Spoke with patient's wife Harlene - she will contact [facility] to set up shipment.  She has ben advised that patient is due for labs at the end of Jan  Sherry Pennant, PharmD, MPH, BCPS, CPP Clinical Pharmacist

## 2024-11-16 NOTE — Progress Notes (Signed)
 Dis-enrolled from [facility] - rx for Taltz  already sent to [facility]. Patient's wife aware and will read MyChart message to set up with [facility]

## 2024-11-21 ENCOUNTER — Other Ambulatory Visit: Payer: Self-pay | Admitting: *Deleted

## 2024-11-21 DIAGNOSIS — Z9225 Personal history of immunosupression therapy: Secondary | ICD-10-CM

## 2024-11-21 DIAGNOSIS — Z111 Encounter for screening for respiratory tuberculosis: Secondary | ICD-10-CM

## 2024-11-21 DIAGNOSIS — Z79899 Other long term (current) drug therapy: Secondary | ICD-10-CM

## 2024-11-26 LAB — CBC WITH DIFFERENTIAL/PLATELET
Basophils Absolute: 0.1 x10E3/uL (ref 0.0–0.2)
Basos: 1 %
EOS (ABSOLUTE): 0.2 x10E3/uL (ref 0.0–0.4)
Eos: 1 %
Hematocrit: 46.1 % (ref 37.5–51.0)
Hemoglobin: 14.7 g/dL (ref 13.0–17.7)
Immature Grans (Abs): 0.1 x10E3/uL (ref 0.0–0.1)
Immature Granulocytes: 1 %
Lymphocytes Absolute: 2.4 x10E3/uL (ref 0.7–3.1)
Lymphs: 22 %
MCH: 27.4 pg (ref 26.6–33.0)
MCHC: 31.9 g/dL (ref 31.5–35.7)
MCV: 86 fL (ref 79–97)
Monocytes Absolute: 0.8 x10E3/uL (ref 0.1–0.9)
Monocytes: 7 %
Neutrophils Absolute: 7.6 x10E3/uL — ABNORMAL HIGH (ref 1.4–7.0)
Neutrophils: 68 %
Platelets: 211 x10E3/uL (ref 150–450)
RBC: 5.36 x10E6/uL (ref 4.14–5.80)
RDW: 14.8 % (ref 11.6–15.4)
WBC: 11.1 x10E3/uL — ABNORMAL HIGH (ref 3.4–10.8)

## 2024-11-26 LAB — CMP14+EGFR
ALT: 16 IU/L (ref 0–44)
AST: 14 IU/L (ref 0–40)
Albumin: 4 g/dL — ABNORMAL LOW (ref 4.1–5.1)
Alkaline Phosphatase: 118 IU/L (ref 47–123)
BUN/Creatinine Ratio: 10 (ref 9–20)
BUN: 11 mg/dL (ref 6–24)
Bilirubin Total: 0.7 mg/dL (ref 0.0–1.2)
CO2: 23 mmol/L (ref 20–29)
Calcium: 8.7 mg/dL (ref 8.7–10.2)
Chloride: 99 mmol/L (ref 96–106)
Creatinine, Ser: 1.15 mg/dL (ref 0.76–1.27)
Globulin, Total: 3.8 g/dL (ref 1.5–4.5)
Glucose: 108 mg/dL — ABNORMAL HIGH (ref 70–99)
Potassium: 4 mmol/L (ref 3.5–5.2)
Sodium: 138 mmol/L (ref 134–144)
Total Protein: 7.8 g/dL (ref 6.0–8.5)
eGFR: 82 mL/min/1.73

## 2024-11-27 ENCOUNTER — Ambulatory Visit: Payer: Self-pay | Admitting: Rheumatology

## 2024-11-27 NOTE — Progress Notes (Signed)
 CBC and CMP are stable.  Glucose is mildly elevated because it was not a fasting sample.

## 2024-11-28 ENCOUNTER — Other Ambulatory Visit: Payer: Self-pay | Admitting: Physician Assistant

## 2024-11-28 DIAGNOSIS — L4050 Arthropathic psoriasis, unspecified: Secondary | ICD-10-CM

## 2024-11-28 DIAGNOSIS — L409 Psoriasis, unspecified: Secondary | ICD-10-CM

## 2024-11-29 NOTE — Telephone Encounter (Signed)
 Last Fill: 11/08/2024  Labs: 11/26/2023 CBC and CMP are stable. Glucose is mildly elevated because it was not a fasting sample.   TB Gold: 03/04/2024 negative    Next Visit: 02/02/2025  Last Visit: 08/29/2024  IK:Ednmpjupr arthritis   Current Dose per office note on 08/29/2024: Taltz  80 mg sq injections every 28 days.   Okay to refill Taltz ?

## 2024-12-07 ENCOUNTER — Other Ambulatory Visit (HOSPITAL_COMMUNITY): Payer: Self-pay

## 2025-02-02 ENCOUNTER — Ambulatory Visit: Admitting: Rheumatology
